# Patient Record
Sex: Male | Born: 1960
Health system: Southern US, Community
[De-identification: ages and names within clinical notes are randomized; demographics above are authoritative.]

## PROBLEM LIST (undated history)

## (undated) DIAGNOSIS — R739 Hyperglycemia, unspecified: Secondary | ICD-10-CM

## (undated) DIAGNOSIS — I10 Essential (primary) hypertension: Secondary | ICD-10-CM

## (undated) DIAGNOSIS — M1990 Unspecified osteoarthritis, unspecified site: Secondary | ICD-10-CM

## (undated) DIAGNOSIS — C801 Malignant (primary) neoplasm, unspecified: Secondary | ICD-10-CM

## (undated) DIAGNOSIS — Z87442 Personal history of urinary calculi: Secondary | ICD-10-CM

## (undated) DIAGNOSIS — E785 Hyperlipidemia, unspecified: Secondary | ICD-10-CM

## (undated) DIAGNOSIS — E119 Type 2 diabetes mellitus without complications: Secondary | ICD-10-CM

## (undated) DIAGNOSIS — K635 Polyp of colon: Secondary | ICD-10-CM

## (undated) HISTORY — DX: Hyperglycemia, unspecified: R73.9

## (undated) HISTORY — DX: Hyperlipidemia, unspecified: E78.5

## (undated) HISTORY — DX: Essential (primary) hypertension: I10

## (undated) HISTORY — PX: BACK SURGERY: SHX140

## (undated) HISTORY — DX: Polyp of colon: K63.5

---

## 2001-01-05 ENCOUNTER — Emergency Department (HOSPITAL_COMMUNITY): Admission: EM | Admit: 2001-01-05 | Discharge: 2001-01-05 | Payer: Self-pay | Admitting: Emergency Medicine

## 2005-02-03 ENCOUNTER — Emergency Department (HOSPITAL_COMMUNITY): Admission: EM | Admit: 2005-02-03 | Discharge: 2005-02-03 | Payer: Self-pay | Admitting: Emergency Medicine

## 2008-04-20 ENCOUNTER — Encounter: Admission: RE | Admit: 2008-04-20 | Discharge: 2008-04-20 | Payer: Self-pay | Admitting: Family Medicine

## 2009-03-17 ENCOUNTER — Emergency Department (HOSPITAL_COMMUNITY): Admission: EM | Admit: 2009-03-17 | Discharge: 2009-03-17 | Payer: Self-pay | Admitting: Family Medicine

## 2009-06-24 LAB — HM COLONOSCOPY

## 2010-09-12 ENCOUNTER — Encounter: Payer: Self-pay | Admitting: Family Medicine

## 2012-10-10 ENCOUNTER — Encounter: Payer: Self-pay | Admitting: Family Medicine

## 2012-10-10 DIAGNOSIS — Z86010 Personal history of colonic polyps: Secondary | ICD-10-CM

## 2012-10-10 DIAGNOSIS — I10 Essential (primary) hypertension: Secondary | ICD-10-CM | POA: Insufficient documentation

## 2012-10-10 DIAGNOSIS — E785 Hyperlipidemia, unspecified: Secondary | ICD-10-CM | POA: Insufficient documentation

## 2012-11-25 ENCOUNTER — Other Ambulatory Visit: Payer: Self-pay | Admitting: Physician Assistant

## 2012-11-25 NOTE — Telephone Encounter (Signed)
Medication refilled per protocol.Patient needs to be seen before any further refills 

## 2012-11-27 ENCOUNTER — Encounter: Payer: Self-pay | Admitting: Physician Assistant

## 2012-11-27 ENCOUNTER — Other Ambulatory Visit: Payer: Self-pay | Admitting: Family Medicine

## 2012-11-27 ENCOUNTER — Ambulatory Visit (INDEPENDENT_AMBULATORY_CARE_PROVIDER_SITE_OTHER): Payer: Managed Care, Other (non HMO) | Admitting: Physician Assistant

## 2012-11-27 VITALS — BP 118/80 | HR 68 | Temp 98.5°F | Resp 18 | Ht 69.0 in | Wt 245.0 lb

## 2012-11-27 DIAGNOSIS — R7309 Other abnormal glucose: Secondary | ICD-10-CM

## 2012-11-27 DIAGNOSIS — R739 Hyperglycemia, unspecified: Secondary | ICD-10-CM

## 2012-11-27 DIAGNOSIS — E785 Hyperlipidemia, unspecified: Secondary | ICD-10-CM

## 2012-11-27 DIAGNOSIS — Z860100 Personal history of colon polyps, unspecified: Secondary | ICD-10-CM

## 2012-11-27 DIAGNOSIS — I10 Essential (primary) hypertension: Secondary | ICD-10-CM

## 2012-11-27 LAB — LIPID PANEL
Cholesterol: 197 mg/dL (ref 0–200)
HDL: 39 mg/dL — ABNORMAL LOW (ref >39)
LDL Cholesterol: 119 mg/dL — ABNORMAL HIGH (ref 0–99)
Total CHOL/HDL Ratio: 5.1 Ratio
Triglycerides: 194 mg/dL — ABNORMAL HIGH (ref <150)
VLDL: 39 mg/dL (ref 0–40)

## 2012-11-27 LAB — COMPLETE METABOLIC PANEL WITH GFR
ALT: 83 U/L — ABNORMAL HIGH (ref 0–53)
AST: 35 U/L (ref 0–37)
Albumin: 4.6 g/dL (ref 3.5–5.2)
Alkaline Phosphatase: 68 U/L (ref 39–117)
BUN: 12 mg/dL (ref 6–23)
CO2: 26 mEq/L (ref 19–32)
Calcium: 9.5 mg/dL (ref 8.4–10.5)
Chloride: 105 mEq/L (ref 96–112)
Creat: 0.76 mg/dL (ref 0.50–1.35)
GFR, Est African American: >89 mL/min
GFR, Est Non African American: >89 mL/min
Glucose, Bld: 105 mg/dL — ABNORMAL HIGH (ref 70–99)
Potassium: 4.4 mEq/L (ref 3.5–5.3)
Sodium: 138 mEq/L (ref 135–145)
Total Bilirubin: 0.6 mg/dL (ref 0.3–1.2)
Total Protein: 6.8 g/dL (ref 6.0–8.3)

## 2012-11-27 LAB — HEMOGLOBIN A1C
Hgb A1c MFr Bld: 6.4 % — ABNORMAL HIGH (ref <5.7)
Mean Plasma Glucose: 137 mg/dL — ABNORMAL HIGH (ref <117)

## 2012-11-27 MED ORDER — SIMVASTATIN 20 MG PO TABS: 20.0000 mg | ORAL_TABLET | Freq: Every day | ORAL | Status: DC

## 2012-11-27 MED ORDER — BENAZEPRIL HCL 20 MG PO TABS: 20.0000 mg | ORAL_TABLET | Freq: Every day | ORAL | Status: DC

## 2012-11-27 MED ORDER — HYDROCHLOROTHIAZIDE 12.5 MG PO CAPS: 12.5000 mg | ORAL_CAPSULE | Freq: Every day | ORAL | Status: DC

## 2012-11-27 NOTE — Progress Notes (Signed)
Patient ID: Ian Leblanc MRN: [id number], DOB: February 01, 1961, 52 y.o. Date of Encounter: @DATE @  Chief Complaint:  Chief Complaint  Patient presents with  . routine check up  med rf  labs    HPI: 52 y.o. year old male  presents for routine f/u.   He has a 46 month old grandson who lives nearby-sees regularly. Has a 73 month old grandchild who lives in [place] come once a year for 18 days-coming in August.  Just started back to walking 25 minutes every day-started back 2-3 weeks ago.  Taking both BP meds as directed. No adverse effects. Taking cholesterol med as directed. No adv effects.  Walking as above.  Says night time is his hardest time regarding his diet.   Past Medical History  Diagnosis Date  . Hypertension   . Hyperlipidemia   . Sessile colonic polyp   . Hyperglycemia      Home Meds: Current Outpatient Prescriptions on File Prior to Visit  Medication Sig Dispense Refill  . benazepril (LOTENSIN) 20 MG tablet TAKE ONE TABLET BY MOUTH EVERY DAY  30 tablet  0  . hydrochlorothiazide (MICROZIDE) 12.5 MG capsule TAKE ONE CAPSULE BY MOUTH EVERY DAY  30 capsule  0  . simvastatin (ZOCOR) 20 MG tablet TAKE ONE TABLET BY MOUTH AT BEDTIME  30 tablet  0   No current facility-administered medications on file prior to visit.    Allergies:  Allergies  Allergen Reactions  . Crestor (Rosuvastatin) Other (See Comments)    Severe myalgias    History   Social History  . Marital Status: Married    Spouse Name: N/A    Number of Children: N/A  . Years of Education: N/A   Occupational History  . Not on file.   Social History Main Topics  . Smoking status: Former Smoker    Quit date: 09/22/2007  . Smokeless tobacco: Not on file  . Alcohol Use: No  . Drug Use: No  . Sexually Active: Not on file   Other Topics Concern  . Not on file   Social History Narrative  . No narrative on file    History reviewed. No pertinent family history.   Review of  Systems: Constitutional: negative for chills, fever, night sweats, weight changes, or fatigue  HEENT: negative for vision changes, hearing loss, congestion, rhinorrhea, ST, epistaxis, or sinus pressure Cardiovascular: negative for chest pain or palpitations. No new/increased shortness of breath or dyspnea on exertion Respiratory: negative for hemoptysis, wheezing, shortness of breath, or cough Abdominal: negative for abdominal pain, nausea, vomiting, diarrhea, or constipation Dermatological: negative for rash or concerning skin lesions Neurologic: negative for headache, dizziness, or syncope All other systems reviewed and are otherwise negative with the exception to those above and in the HPI.   Physical Exam: Blood pressure 118/80, pulse 68, temperature 98.5 F (36.9 C), temperature source Oral, resp. rate 18, height 5\' 9"  (1.753 m), weight 245 lb (111.131 kg)., Body mass index is 36.16 kg/(m^2). General: Well developed, well nourished,WM. Very Pleasant.  in no acute distress  Neck: Supple. No thyromegaly. Full ROM. No lymphadenopathy.No carotid bruits. Lungs: Clear bilaterally to auscultation without wheezes, rales, or rhonchi. Breathing is unlabored. Heart: RRR with S1 S2. No murmurs, rubs, or gallops. Abdomen: Soft, non-tender, non-distended with normoactive bowel sounds. No hepatomegaly. No rebound/guarding. No obvious abdominal masses. Musculoskeletal:  Strength and tone normal for age. Extremities/Skin: Warm and dry. No clubbing or cyanosis. No edema. No rashes or suspicious lesions. Neuro: Alert and  oriented X 3. Moves all extremities spontaneously. Gait is normal. CNII-XII grossly in tact. Psych:  Responds to questions appropriately with a normal affect.    ASSESSMENT AND PLAN:  52 y.o. year old male with  1. HLD (hyperlipidemia) On Simvastatin 20mg . Check labs to monitor. - COMPLETE METABOLIC PANEL WITH GFR - Lipid panel  2. HTN (hypertension) At goal on current meds. Cont  current meds. Check BMET. - COMPLETE METABOLIC PANEL WITH GFR  3. Hyperglycemia See HPI reg diet, exercise.  He has gained weight. He is aware of need for increased compliance sith diet and exercise. Recheck glucose/A1c. Prior Weights; 9/10-220 lbs.   8/12-230 lbs   9/13-240 lbs - COMPLETE METABOLIC PANEL WITH GFR - Hemoglobin A1c  4. Personal history of colonic polyps Had colonoscopy 06/24/09 with polypectomy. Report says repeat 3-5 years. (Due 06/24/12-06/24/14). Pt will call Dr Rolla Etienne office reg f/u date.  5. Screening PSA-Done 9/13.Nml. Repeat 9/14.  6. Immunizations: Tdap given 8/12    Influenza: 9/13  Signed, [address] [place], [place], [facility] 11/27/2012 9:53 AM

## 2012-11-29 ENCOUNTER — Telehealth: Payer: Self-pay | Admitting: Family Medicine

## 2012-11-29 NOTE — Telephone Encounter (Signed)
Message copied by Donne Anon on Fri Nov 29, 2012 11:08 AM ------      Message from: Allayne Butcher      Created: Thu Nov 28, 2012  7:01 PM       Tell him his blood sugar has gotten worse. Is borderline of Diabetes. He must walk regularly (30 minutes every day) and MUST decrease carbohydrates in diet. Schedule f/u OV in 3 months instead of 6 so we can recheck [facility] etc. Continue current medicines without changes now--just make the diet and exercise changes for now and f/u in 3 months. ------

## 2012-11-29 NOTE — Telephone Encounter (Signed)
LMTCB

## 2013-03-05 ENCOUNTER — Encounter: Payer: Self-pay | Admitting: Physician Assistant

## 2013-03-05 ENCOUNTER — Ambulatory Visit (INDEPENDENT_AMBULATORY_CARE_PROVIDER_SITE_OTHER): Payer: 59 | Admitting: Physician Assistant

## 2013-03-05 VITALS — BP 122/80 | HR 60 | Temp 98.0°F | Resp 18 | Ht 69.5 in | Wt 227.0 lb

## 2013-03-05 DIAGNOSIS — Z860100 Personal history of colon polyps, unspecified: Secondary | ICD-10-CM

## 2013-03-05 DIAGNOSIS — R739 Hyperglycemia, unspecified: Secondary | ICD-10-CM

## 2013-03-05 DIAGNOSIS — R7309 Other abnormal glucose: Secondary | ICD-10-CM

## 2013-03-05 DIAGNOSIS — E785 Hyperlipidemia, unspecified: Secondary | ICD-10-CM

## 2013-03-05 DIAGNOSIS — I10 Essential (primary) hypertension: Secondary | ICD-10-CM

## 2013-03-05 LAB — HEMOGLOBIN A1C, FINGERSTICK: Hgb A1C (fingerstick): 5.2 % (ref <5.7)

## 2013-03-05 NOTE — Progress Notes (Signed)
Patient ID: Ian Leblanc MRN: [id number], DOB: 12/28/1960, 52 y.o. Date of Encounter: @DATE @  Chief Complaint:  Chief Complaint  Patient presents with  . 3 mth check up    is fasting    HPI: 52 y.o. year old white male  presents for routine f/u.  He has loast 13 pounds since LOV-4/14. He is still walking every morning for 25 minutes. 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 Also, he has made diet changes since LOV.  Breakfast: [employer]: Eats with his mom: vegetables. Cookedd healthy. He does her grocery shopping. Dinner; His wife follows the carb handout for making their dinner. He had been eating a lot at dinner and eating late.He has started eating earlier and has decreased portions.  Has decreased bread intake a lot.   Taking all meds as directed. No adv effects.   He has a grandson who lives nearby-about 86 months old now. He sees him regularly.  He has a 2 y/o grandchild who lives in [place].Comes every August. They are coming aginthis August. Staying with them in their house for 23 days.    Past Medical History  Diagnosis Date  . Hypertension   . Hyperlipidemia   . Sessile colonic polyp   . Hyperglycemia      Home Meds: See attached medication section for current medication list. Any medications entered into computer today will not appear on this note's list. The medications listed below were entered prior to today. Current Outpatient Prescriptions on File Prior to Visit  Medication Sig Dispense Refill  . aspirin 81 MG tablet Take 81 mg by mouth daily.      . benazepril (LOTENSIN) 20 MG tablet Take 1 tablet (20 mg total) by mouth daily.  90 tablet  1  . hydrochlorothiazide (MICROZIDE) 12.5 MG capsule Take 1 capsule (12.5 mg total) by mouth daily.  90 capsule  1  . Multiple Vitamins-Minerals (MULTIVITAMIN PO) Take 1 tablet by mouth daily. Over 50 mvi qd      . simvastatin (ZOCOR) 20 MG tablet Take 1 tablet (20 mg total) by mouth at bedtime.   90 tablet  1   No current facility-administered medications on file prior to visit.    Allergies:  Allergies  Allergen Reactions  . Crestor (Rosuvastatin) Other (See Comments)    Severe myalgias    History   Social History  . Marital Status: Married    Spouse Name: N/A    Number of Children: N/A  . Years of Education: N/A   Occupational History  . Not on file.   Social History Main Topics  . Smoking status: Former Smoker    Quit date: 09/22/2007  . Smokeless tobacco: Not on file  . Alcohol Use: No  . Drug Use: No  . Sexually Active: Not on file   Other Topics Concern  . Not on file   Social History Narrative  . No narrative on file    History reviewed. No pertinent family history.   Review of Systems:  See HPI for pertinent ROS. All other ROS negative.    Physical Exam: Blood pressure 122/80, pulse 60, temperature 98 F (36.7 C), temperature source Oral, resp. rate 18, height 5' 9.5" (1.765 m), weight 227 lb (102.967 kg)., Body mass index is 33.05 kg/(m^2). General: WNWD WM. Appears in no acute distress. Neck: Supple. No thyromegaly. No lymphadenopathy. No carotid bruits.  Lungs: Clear bilaterally to auscultation without wheezes, rales, or rhonchi. Breathing is unlabored. Heart:  RRR with S1 S2. No murmurs, rubs, or gallops. Abdomen: Soft, non-tender, non-distended with normoactive bowel sounds. No hepatomegaly. No rebound/guarding. No obvious abdominal masses. Musculoskeletal:  Strength and tone normal for age. Extremities/Skin: Warm and dry. No clubbing or cyanosis. No edema. No rashes or suspicious lesions. Neuro: Alert and oriented X 3. Moves all extremities spontaneously. Gait is normal. CNII-XII grossly in tact. Psych:  Responds to questions appropriately with a normal affect.     ASSESSMENT AND PLAN:  52 y.o. year old male with  1. HLD (hyperlipidemia) Cont current med. Had FLP LFT 4/14.  2. HTN (hypertension) At goal. Had Bmet 4/14. Cont  same.  3. Hyperglycemia A1C had increased 4/14. Recheck now. Pt has been very compliant with diet and exercise. Keep up the good work!!! - Hemoglobin A1c  4. Personal history of colonic polyps Had colonoscopy 06/24/09 with polypectomy. Repeat 3-5 years. Pt did f/u with Dr. Elnoria Howard since LOV here. He had scheduled colonoscopy but had to reschedule sec to wife's work schedule.   5.Screening PSA: 04/2012-nml. Repeat 9/14.  6.Immunizations:  TDap: 03/2011  ROV 3 months.  Murray Hodgkins [place], [place], [facility] 03/05/2013 8:18 AM

## 2013-03-05 NOTE — Addendum Note (Signed)
Addended by: WRAY, [place] on: 03/05/2013 09:52 AM   Modules accepted: Orders

## 2013-03-05 NOTE — Addendum Note (Signed)
Addended by: WRAY, [place] on: 03/05/2013 09:51 AM   Modules accepted: Orders

## 2013-03-07 ENCOUNTER — Encounter: Payer: Self-pay | Admitting: Family Medicine

## 2013-04-01 ENCOUNTER — Telehealth: Payer: Self-pay | Admitting: Physician Assistant

## 2013-04-01 MED ORDER — AZITHROMYCIN 250 MG PO TABS: ORAL_TABLET | ORAL | Status: DC

## 2013-04-01 NOTE — Telephone Encounter (Signed)
Patient aware and med sent to pharm 

## 2013-04-01 NOTE — Telephone Encounter (Signed)
z pack is okay but I would caution the patient not to take the abx unless his symptoms worsen or are not better in 1 week.  A cold is a virus and a z-pack will not treat a virus only bacteria.

## 2013-06-05 ENCOUNTER — Encounter: Payer: Self-pay | Admitting: Physician Assistant

## 2013-06-05 ENCOUNTER — Ambulatory Visit (INDEPENDENT_AMBULATORY_CARE_PROVIDER_SITE_OTHER): Payer: Managed Care, Other (non HMO) | Admitting: Physician Assistant

## 2013-06-05 VITALS — BP 154/104 | HR 68 | Temp 98.4°F | Resp 20 | Wt 229.0 lb

## 2013-06-05 DIAGNOSIS — Z860100 Personal history of colon polyps, unspecified: Secondary | ICD-10-CM

## 2013-06-05 DIAGNOSIS — Z125 Encounter for screening for malignant neoplasm of prostate: Secondary | ICD-10-CM

## 2013-06-05 DIAGNOSIS — R739 Hyperglycemia, unspecified: Secondary | ICD-10-CM

## 2013-06-05 DIAGNOSIS — I10 Essential (primary) hypertension: Secondary | ICD-10-CM

## 2013-06-05 DIAGNOSIS — R7309 Other abnormal glucose: Secondary | ICD-10-CM

## 2013-06-05 DIAGNOSIS — E785 Hyperlipidemia, unspecified: Secondary | ICD-10-CM

## 2013-06-05 LAB — LIPID PANEL
Cholesterol: 222 mg/dL — ABNORMAL HIGH (ref 0–200)
HDL: 41 mg/dL (ref >39)
LDL Cholesterol: 136 mg/dL — ABNORMAL HIGH (ref 0–99)
Total CHOL/HDL Ratio: 5.4 Ratio
Triglycerides: 227 mg/dL — ABNORMAL HIGH (ref <150)
VLDL: 45 mg/dL — ABNORMAL HIGH (ref 0–40)

## 2013-06-05 LAB — COMPLETE METABOLIC PANEL WITH GFR
ALT: 50 U/L (ref 0–53)
AST: 23 U/L (ref 0–37)
Albumin: 4.6 g/dL (ref 3.5–5.2)
Alkaline Phosphatase: 60 U/L (ref 39–117)
BUN: 16 mg/dL (ref 6–23)
CO2: 26 mEq/L (ref 19–32)
Calcium: 9.9 mg/dL (ref 8.4–10.5)
Chloride: 103 mEq/L (ref 96–112)
Creat: 0.84 mg/dL (ref 0.50–1.35)
GFR, Est African American: >89 mL/min
GFR, Est Non African American: >89 mL/min
Glucose, Bld: 118 mg/dL — ABNORMAL HIGH (ref 70–99)
Potassium: 4.6 mEq/L (ref 3.5–5.3)
Sodium: 139 mEq/L (ref 135–145)
Total Bilirubin: 0.7 mg/dL (ref 0.3–1.2)
Total Protein: 7 g/dL (ref 6.0–8.3)

## 2013-06-05 LAB — PSA: PSA: 1.13 ng/mL (ref <=4.00)

## 2013-06-05 LAB — HEMOGLOBIN A1C
Hgb A1c MFr Bld: 6.1 % — ABNORMAL HIGH (ref <5.7)
Mean Plasma Glucose: 128 mg/dL — ABNORMAL HIGH (ref <117)

## 2013-06-05 NOTE — Progress Notes (Signed)
Patient ID: Ian Leblanc MRN: [id number], DOB: 09/06/1960, 52 y.o. Date of Encounter: @DATE @  Chief Complaint:  Chief Complaint  Patient presents with  . 3 mth check up    is fasting    HPI: 52 y.o. year old white male  presents for routine followup office visit.  At his last office visit with me on 03/05/13 he had lost 13 pounds since his prior visit in April. He reported that he was walking every morning for 25 minutes. He was also doing a lot of work on his house: Pudding and heart border with floors et [employer]. Also he made a lot of diet changes. Her breakfast he was eating his own bar. For lunch he eats with his mother and was eating vegetables which were cut in a healthy manner. For dinner his wife is following a carbohydrate handout in regards to making her better. Prior to that he had been eating a lot at dinnertime and eating late. He has started eating earlier and decreasing his portions. As well he decrease bread intake a lot. Today his weight is only up 2 pounds compared to her was in July he is maintain this pretty well.  He states that "he knows his bloood pressure is high" as his mother has been stressing him out. Says that she recently hurt both of her shoulders because she was standing in a chair trying to adjust a ceiling fan--fell. Says she also had recently had another incident that involved both his mother and his sister and they both lost her balance and fell and broke a window which he says he now has to repair.  He has a grandson who lives nearby. He is now about 69 months old. He sees him regularly. He also has a 67-year-old grandchild who lives in [place]. They come every August. They did come again this past August and he said he had a very busy, but very good time together.    Past Medical History  Diagnosis Date  . Hypertension   . Hyperlipidemia   . Sessile colonic polyp   . Hyperglycemia      Home Meds: See attached medication section for current medication  list. Any medications entered into computer today will not appear on this note's list. The medications listed below were entered prior to today. Current Outpatient Prescriptions on File Prior to Visit  Medication Sig Dispense Refill  . aspirin 81 MG tablet Take 81 mg by mouth daily.      . benazepril (LOTENSIN) 20 MG tablet Take 1 tablet (20 mg total) by mouth daily.  90 tablet  1  . hydrochlorothiazide (MICROZIDE) 12.5 MG capsule Take 1 capsule (12.5 mg total) by mouth daily.  90 capsule  1  . Multiple Vitamins-Minerals (MULTIVITAMIN PO) Take 1 tablet by mouth daily. Over 50 mvi qd      . simvastatin (ZOCOR) 20 MG tablet Take 1 tablet (20 mg total) by mouth at bedtime.  90 tablet  1   No current facility-administered medications on file prior to visit.    Allergies:  Allergies  Allergen Reactions  . Crestor [Rosuvastatin] Other (See Comments)    Severe myalgias    History   Social History  . Marital Status: Married    Spouse Name: N/A    Number of Children: N/A  . Years of Education: N/A   Occupational History  . Not on file.   Social History Main Topics  . Smoking status: Former Engineer, civil (consulting)  date: 09/22/2007  . Smokeless tobacco: Not on file  . Alcohol Use: No  . Drug Use: No  . Sexual Activity: Not on file   Other Topics Concern  . Not on file   Social History Narrative  . No narrative on file    History reviewed. No pertinent family history.   Review of Systems:  See HPI for pertinent ROS. All other ROS negative.    Physical Exam: Blood pressure 154/104, pulse 68, temperature 98.4 F (36.9 C), temperature source Oral, resp. rate 20, weight 229 lb (103.874 kg)., Body mass index is 33.34 kg/(m^2). General: WNWD WM. Appears in no acute distress. Neck: Supple. No thyromegaly. No lymphadenopathy. No Carotid bruits Lungs: Clear bilaterally to auscultation without wheezes, rales, or rhonchi. Breathing is unlabored. Heart: RRR with S1 S2. No murmurs, rubs, or  gallops. Abdomen: Soft, non-tender, non-distended with normoactive bowel sounds. No hepatomegaly. No rebound/guarding. No obvious abdominal masses. Musculoskeletal:  Strength and tone normal for age. Extremities/Skin: Warm and dry. No clubbing or cyanosis. No edema. No rashes or suspicious lesions. Neuro: Alert and oriented X 3. Moves all extremities spontaneously. Gait is normal. CNII-XII grossly in tact. Psych:  Responds to questions appropriately with a normal affect.     ASSESSMENT AND PLAN:  52 y.o. year old male with  1. HTN (hypertension) His blood pressure is high today. He states that he honestly thinks it is because of his mother. He says that he is a hardy top to his mom as well as his siblings about the fact that he is not continued to be involved and there crazy behavior. His blood pressure was well-controlled at his last visit here in July. Not make changes at this time but will monitor. He knew current medications at this time and check labs monitor - COMPLETE METABOLIC PANEL WITH GFR  2. HLD (hyperlipidemia) - COMPLETE METABOLIC PANEL WITH GFR - Lipid panel  3. Hyperglycemia A1c had increased April 2014. However he was an extremely compliant with diet and exercise and his A1c was down to normal range in July. - COMPLETE METABOLIC PANEL WITH GFR - Hemoglobin A1c  4. Screening for prostate cancer Last PSA was 04/2012. - PSA  5. Personal history of colonic polyps Colonoscopy 06/24/09 with polypectomy. Repeat 3-5 years. She did have repeat colonoscopy since his last visit with me in April. Patient states that this showed a very small polyp. Was told to repeat 5 years.  6. Immunizations: T. dap 03/2011. Recommended influenza vaccine he defers.  Office visit 6 months sooner if needed.   [address] [place], [place], [facility] 06/05/2013 1:44 PM

## 2013-06-06 ENCOUNTER — Telehealth: Payer: Self-pay | Admitting: Family Medicine

## 2013-06-06 DIAGNOSIS — E785 Hyperlipidemia, unspecified: Secondary | ICD-10-CM

## 2013-06-06 MED ORDER — SIMVASTATIN 40 MG PO TABS: 40.0000 mg | ORAL_TABLET | Freq: Every day | ORAL | Status: DC

## 2013-06-06 NOTE — Telephone Encounter (Signed)
Message copied by Donne Anon on Fri Jun 06, 2013  6:24 PM ------      Message from: Allayne Butcher      Created: Fri Jun 06, 2013  9:34 AM       Cholesterol is a little higher than goal.      Currently on Zocor 20mg . Increase to 40mg .       Recheck fasting lab in 6 weeks.      Order Zocor 40 and FLP/LFT.      Rest of labs are good. ------

## 2013-06-06 NOTE — Telephone Encounter (Signed)
Pt aware of labs,  New Rx sent and labs ordered

## 2013-07-04 ENCOUNTER — Other Ambulatory Visit: Payer: Self-pay | Admitting: Physician Assistant

## 2013-07-14 ENCOUNTER — Emergency Department (HOSPITAL_COMMUNITY)
Admission: EM | Admit: 2013-07-14 | Discharge: 2013-07-14 | Discharge status: Against Medical Advice | Payer: Managed Care, Other (non HMO) | Attending: Emergency Medicine | Admitting: Emergency Medicine

## 2013-07-14 ENCOUNTER — Encounter (HOSPITAL_COMMUNITY): Payer: Self-pay | Admitting: Emergency Medicine

## 2013-07-14 ENCOUNTER — Emergency Department (HOSPITAL_COMMUNITY): Payer: Managed Care, Other (non HMO)

## 2013-07-14 ENCOUNTER — Emergency Department (HOSPITAL_COMMUNITY)
Admission: EM | Admit: 2013-07-14 | Discharge: 2013-07-14 | Disposition: A | Discharge status: Home / Self Care | Payer: Managed Care, Other (non HMO) | Attending: Emergency Medicine | Admitting: Emergency Medicine

## 2013-07-14 DIAGNOSIS — Z87891 Personal history of nicotine dependence: Secondary | ICD-10-CM | POA: Insufficient documentation

## 2013-07-14 DIAGNOSIS — Z79899 Other long term (current) drug therapy: Secondary | ICD-10-CM | POA: Insufficient documentation

## 2013-07-14 DIAGNOSIS — R209 Unspecified disturbances of skin sensation: Secondary | ICD-10-CM | POA: Insufficient documentation

## 2013-07-14 DIAGNOSIS — I10 Essential (primary) hypertension: Secondary | ICD-10-CM | POA: Insufficient documentation

## 2013-07-14 DIAGNOSIS — R0602 Shortness of breath: Secondary | ICD-10-CM | POA: Insufficient documentation

## 2013-07-14 DIAGNOSIS — R0789 Other chest pain: Secondary | ICD-10-CM | POA: Insufficient documentation

## 2013-07-14 DIAGNOSIS — Z860100 Personal history of colon polyps, unspecified: Secondary | ICD-10-CM | POA: Insufficient documentation

## 2013-07-14 DIAGNOSIS — R079 Chest pain, unspecified: Secondary | ICD-10-CM

## 2013-07-14 DIAGNOSIS — Z7982 Long term (current) use of aspirin: Secondary | ICD-10-CM | POA: Insufficient documentation

## 2013-07-14 DIAGNOSIS — E785 Hyperlipidemia, unspecified: Secondary | ICD-10-CM | POA: Insufficient documentation

## 2013-07-14 LAB — BASIC METABOLIC PANEL
BUN: 15 mg/dL (ref 6–23)
CO2: 22 mEq/L (ref 19–32)
Calcium: 8.9 mg/dL (ref 8.4–10.5)
Chloride: 102 mEq/L (ref 96–112)
Creatinine, Ser: 0.62 mg/dL (ref 0.50–1.35)
GFR calc Af Amer: >90 mL/min (ref >90)
GFR calc non Af Amer: >90 mL/min (ref >90)
Glucose, Bld: 114 mg/dL — ABNORMAL HIGH (ref 70–99)
Potassium: 3.6 mEq/L (ref 3.5–5.1)
Sodium: 135 mEq/L (ref 135–145)

## 2013-07-14 LAB — CBC
HCT: 43.5 % (ref 39.0–52.0)
Hemoglobin: 15.6 g/dL (ref 13.0–17.0)
MCH: 30.6 pg (ref 26.0–34.0)
MCHC: 35.9 g/dL (ref 30.0–36.0)
MCV: 85.3 fL (ref 78.0–100.0)
Platelets: 152 10*3/uL (ref 150–400)
RBC: 5.1 MIL/uL (ref 4.22–5.81)
RDW: 13 % (ref 11.5–15.5)
WBC: 7.3 10*3/uL (ref 4.0–10.5)

## 2013-07-14 LAB — COMPREHENSIVE METABOLIC PANEL
ALT: 56 U/L — ABNORMAL HIGH (ref 0–53)
AST: 29 U/L (ref 0–37)
Albumin: 4.4 g/dL (ref 3.5–5.2)
Alkaline Phosphatase: 70 U/L (ref 39–117)
BUN: 12 mg/dL (ref 6–23)
CO2: 22 mEq/L (ref 19–32)
Calcium: 9.4 mg/dL (ref 8.4–10.5)
Chloride: 102 mEq/L (ref 96–112)
Creatinine, Ser: 0.65 mg/dL (ref 0.50–1.35)
GFR calc Af Amer: >90 mL/min (ref >90)
GFR calc non Af Amer: >90 mL/min (ref >90)
Glucose, Bld: 103 mg/dL — ABNORMAL HIGH (ref 70–99)
Potassium: 3.7 mEq/L (ref 3.5–5.1)
Sodium: 137 mEq/L (ref 135–145)
Total Bilirubin: 0.3 mg/dL (ref 0.3–1.2)
Total Protein: 7.5 g/dL (ref 6.0–8.3)

## 2013-07-14 LAB — POCT I-STAT TROPONIN I
Troponin i, poc: 0 ng/mL (ref 0.00–0.08)
Troponin i, poc: 0 ng/mL (ref 0.00–0.08)

## 2013-07-14 MED ORDER — ASPIRIN 325 MG PO TABS
325.0000 mg | ORAL_TABLET | Freq: Once | ORAL | Status: AC
  Administered 2013-07-14: 325 mg via ORAL
  Filled 2013-07-14: qty 1

## 2013-07-14 NOTE — ED Notes (Signed)
Patient with shortness of breath, increasing since 2pm this afternoon.  Patient states he is unable to take a deep breath.  Patient denies any chest pain, but has had bilateral arm numbness that has gone away.

## 2013-07-14 NOTE — ED Notes (Signed)
W/r updated about wait.  

## 2013-07-14 NOTE — ED Notes (Signed)
Pt reports sob since last night, sts labored breathing, pt reports that here last night and left at 230 due to wait times and reports heaviness wile breathing today. No pain at present.

## 2013-07-14 NOTE — ED Notes (Signed)
W/r updated about wait.

## 2013-07-14 NOTE — ED Notes (Signed)
No answer, unable to find. 

## 2013-07-14 NOTE — ED Notes (Signed)
Wait, plan process explained with rationale. Xray resulted explained. Labs drawn explained.pt talking about leaving. Encouraged to stay.

## 2013-07-14 NOTE — ED Provider Notes (Signed)
CSN: [id number]     Arrival date & time 07/14/13  1332 History   First MD Initiated Contact with Patient 07/14/13 1459     Chief Complaint  Patient presents with  . Shortness of Breath   (Consider location/radiation/quality/duration/timing/severity/associated sxs/prior Treatment) HPI Ian Leblanc is a 52 y.o. male who presented to the emergency department for concern of SOB.  Patient reports that this started approximately 16 hours ago.  Just felt a heaviness in his chest.  Moderate in severity.  Never had this before.  No cough.  Came in to the ED last night and had negative CXR and undetectable troponin.  Unfortunately, the ED wait was too long and he left before being seen.  Symptoms improved and he has now been asymptomatic for last 12 hours.  No other symptoms.  Past Medical History  Diagnosis Date  . Hypertension   . Hyperlipidemia   . Sessile colonic polyp   . Hyperglycemia    Past Surgical History  Procedure Laterality Date  . Hx back surg     No family history on file. History  Substance Use Topics  . Smoking status: Former Smoker    Quit date: 09/22/2007  . Smokeless tobacco: Not on file  . Alcohol Use: No    Review of Systems  Constitutional: Negative for fever and chills.  HENT: Negative for congestion and sore throat.   Respiratory: Positive for shortness of breath. Negative for cough.   Gastrointestinal: Negative for nausea, vomiting, abdominal pain, diarrhea and constipation.  Endocrine: Negative for polyuria.  Genitourinary: Negative for dysuria and hematuria.  Musculoskeletal: Negative for neck pain.  Skin: Negative for rash.  Neurological: Negative for headaches.  Psychiatric/Behavioral: Negative.   All other systems reviewed and are negative.    Allergies  Crestor  Home Medications   Current Outpatient Rx  Name  Route  Sig  Dispense  Refill  . aspirin 81 MG tablet   Oral   Take 81 mg by mouth daily.         . benazepril (LOTENSIN) 20 MG  tablet   Oral   Take 20 mg by mouth daily.         . hydrochlorothiazide (MICROZIDE) 12.5 MG capsule   Oral   Take 12.5 mg by mouth daily.         . Multiple Vitamins-Minerals (MULTIVITAMIN PO)   Oral   Take 1 tablet by mouth daily. Over 50 mvi qd         . Omega-3 Fatty Acids (OMEGA-3 FISH OIL PO)   Oral   Take 3 capsules by mouth daily.         . simvastatin (ZOCOR) 40 MG tablet   Oral   Take 1 tablet (40 mg total) by mouth at bedtime.   90 tablet   0    BP 137/93  Pulse 70  Temp(Src) 98.2 F (36.8 C)  Resp 17  SpO2 93% Physical Exam  Nursing note and vitals reviewed. Constitutional: He is oriented to person, place, and time. He appears well-developed and well-nourished. No distress.  HENT:  Head: Normocephalic and atraumatic.  Right Ear: External ear normal.  Left Ear: External ear normal.  Mouth/Throat: Oropharynx is clear and moist. No oropharyngeal exudate.  Eyes: Conjunctivae are normal. Pupils are equal, round, and reactive to light. Right eye exhibits no discharge.  Neck: Normal range of motion. Neck supple. No tracheal deviation present.  Cardiovascular: Normal rate, regular rhythm and intact distal pulses.   Pulmonary/Chest: Effort  normal. No respiratory distress. He has no wheezes. He has no rales.  Abdominal: Soft. He exhibits no distension. There is no tenderness. There is no rebound and no guarding.  Musculoskeletal: Normal range of motion.  Neurological: He is alert and oriented to person, place, and time.  Skin: Skin is warm and dry. No rash noted. He is not diaphoretic.  Psychiatric: He has a normal mood and affect.    ED Course  Procedures (including critical care time) Labs Review Labs Reviewed  COMPREHENSIVE METABOLIC PANEL - Abnormal; Notable for the following:    Glucose, Bld 103 (*)    ALT 56 (*)    All other components within normal limits  POCT I-STAT TROPONIN I   Imaging Review Dg Chest 2 View  07/14/2013   CLINICAL DATA:   Shortness of breath.  EXAM: CHEST  2 VIEW  COMPARISON:  None.  FINDINGS: The heart size and mediastinal contours are within normal limits. Both lungs are clear. The visualized skeletal structures are unremarkable.  IMPRESSION: No active cardiopulmonary disease.   Electronically Signed   By: Myles Rosenthal M.D.   On: 07/14/2013 00:38    EKG Interpretation   None       MDM   1. Chest pain   2. Shortness of breath     Ian Leblanc is a 52 y.o. male with history of HTN, HLD, and diet controlled DM2 who presents to the ED with 2 hours of SOB and chest heaviness that resolved 12 hours ago.  Patient with negative troponin at that time (although LWBS) and negative troponin now making a significant delta.  Very atypical story for PE and patient PERC negative.  Doubt dissection given description and complete resolution of symptoms.  Discussed at length his risk factors.  Patient discussed with cardiologists at [facility] and patient will get outpatient in 5 days.  No sooner appt could be achieved as it is Thanksgiving weekend.    Arloa Koh, MD 07/14/13 1744

## 2013-07-14 NOTE — ED Notes (Addendum)
Sob and upper abd pain since sat got bad last night and  Came here but wait was to long so he left and came back today states discomfort is there and it has never been there before

## 2013-07-14 NOTE — ED Provider Notes (Addendum)
52 year old male was awakened by difficulty breathing and some chest heaviness last night. He came to the emergency where he had blood work, ECG, chest x-ray but was not seen by physician. The wait was too long so he went home. He asked he was completely pain free and breathing normally from about 3 AM. He has not had any discomfort since then, but his wife convinced him to come in to be adequately checked. He does relate that he walks 20 minutes every day nevertheless several weeks, he has noted that he is winded at the end of the walk but no chest pain, heaviness, tightness, pressure. He waited feeling well or get better within a couple of minutes of stopping walking. He has not noticed any difficulty climbing steps. He does have cardiac risk factors of hyperlipidemia and hypertension. On exam, he is normotensive. Lungs are clear and heart has regular rate and rhythm. There is trace pitting edema. Old records are reviewed and he has had a slightly elevated hemoglobin A1c but no actual significant hyperglycemia documented. There is a concern that this may be an angina equivalent. His troponin here is normal and being approximately 12 hours after resolution of symptoms, is sufficient to rule out acute myocardial injury. Will be referred to [facility] for outpatient stress testing. His RD taking aspirin on a daily basis.  I saw and evaluated the patient, reviewed the resident's note and I agree with the findings and plan.  EKG Interpretation    Date/Time:  Monday July 14 2013 13:49:41 EST Ventricular Rate:  69 PR Interval:  212 QRS Duration: 84 QT Interval:  372 QTC Calculation: 398 R Axis:   48 Text Interpretation:  Sinus rhythm with 1st degree A-V block Anteroseptal infarct , age undetermined Abnormal ECG No significant change since last tracing Confirmed by [facility]  MD, Jaskirat Schwieger [phone]) on 07/14/2013 3:48:27 PM              Dione Booze, MD 07/14/13 1914  Dione Booze, MD 07/14/13  [phone]

## 2013-07-14 NOTE — ED Notes (Signed)
Pt up ambulatory to the bathroom at this time with no difficulty or distress; family at bedside

## 2013-07-16 ENCOUNTER — Encounter: Payer: Self-pay | Admitting: Internal Medicine

## 2013-07-16 ENCOUNTER — Ambulatory Visit (INDEPENDENT_AMBULATORY_CARE_PROVIDER_SITE_OTHER): Payer: Managed Care, Other (non HMO) | Admitting: Internal Medicine

## 2013-07-16 VITALS — BP 130/70 | HR 87 | Ht 70.0 in | Wt 239.2 lb

## 2013-07-16 DIAGNOSIS — R9431 Abnormal electrocardiogram [ECG] [EKG]: Secondary | ICD-10-CM | POA: Insufficient documentation

## 2013-07-16 DIAGNOSIS — I10 Essential (primary) hypertension: Secondary | ICD-10-CM

## 2013-07-16 DIAGNOSIS — R079 Chest pain, unspecified: Secondary | ICD-10-CM

## 2013-07-16 DIAGNOSIS — E785 Hyperlipidemia, unspecified: Secondary | ICD-10-CM

## 2013-07-16 HISTORY — DX: Chest pain, unspecified: R07.9

## 2013-07-16 HISTORY — DX: Abnormal electrocardiogram (ECG) (EKG): R94.31

## 2013-07-16 MED ORDER — NITROGLYCERIN 0.4 MG SL SUBL: 0.4000 mg | SUBLINGUAL_TABLET | SUBLINGUAL | Status: DC | PRN

## 2013-07-16 NOTE — Patient Instructions (Signed)
Dr. Rennis Golden has ordered nitroglycerin sublingual tablets to use as needed for chest pain. You can use 1 tablet every 5 minutes, up to 3 doses. If your pain does not subside after 2 doses, you may want to go the to ED.  Your physician has requested that you have a lexiscan myoview. For further information please visit [email or web address] Please follow instruction sheet, as given. Please schedule this test for early next week.  Your physician recommends that you schedule a follow-up appointment after your test.

## 2013-07-16 NOTE — Progress Notes (Signed)
OFFICE NOTE  Chief Complaint:  Chest pain, DOE, fatigue  Primary Care Physician: Frazier Richards, PA-C  HPI:  Ian Leblanc is a pleasant 52 year old male who was recently awakened in the middle of night with shortness of breath. He felt like he could not catch his breath and there was a knot across his upper stomach area. He denied any specific chest pain but has had some left upper chest pain in the area around his left shoulder. He knows his symptoms have been getting worse over the past several weeks and he feels more fatigued and has poor exercise tolerance. 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  He presented a few days ago (11/24) to the emergency room and had a chest x-ray and troponin drawn which were negative.  Labs were otherwise unremarkable.  Unfortunately due to a significant weight in the emergency room, he left AMA without being seen.  After returning home he had some more symptoms and his wife recommended that he go back to the emergency room. He was seen the following day and another troponin was drawn which was also negative. It was felt that he should have cardiology followup, but he was concerned about waiting over the holiday and we were able to accommodate seeing him today.  PMHx:  Past Medical History  Diagnosis Date  . Hypertension   . Hyperlipidemia   . Sessile colonic polyp   . Hyperglycemia     Past Surgical History  Procedure Laterality Date  . Back surgery      cyst removal    FAMHx:  History reviewed. No pertinent family history.  SOCHx:   reports that he quit smoking about 10 years ago. He has never used smokeless tobacco. He reports that he does not drink alcohol or use illicit drugs.  ALLERGIES:    Allergies  Allergen Reactions  . Crestor [Rosuvastatin] Other (See Comments)    Severe myalgias    ROS: A comprehensive review of systems was negative except for: Constitutional: positive for fatigue Respiratory: positive for dyspnea on exertion Cardiovascular: positive for chest pain  HOME MEDS: Current Outpatient Prescriptions  Medication Sig Dispense Refill  . aspirin 81 MG tablet Take 81 mg by mouth daily.      . benazepril (LOTENSIN) 20 MG tablet Take 20 mg by mouth daily.      . hydrochlorothiazide (MICROZIDE) 12.5 MG capsule Take 12.5 mg by mouth daily.      . Multiple Vitamins-Minerals (MULTIVITAMIN PO) Take 1 tablet by mouth daily. Over 50 mvi qd      . Omega-3 Fatty Acids (OMEGA-3 FISH OIL PO) Take 600 mg by mouth daily.       . simvastatin (ZOCOR) 40 MG tablet Take 1 tablet (40 mg total) by mouth at bedtime.  90 tablet  0  . nitroGLYCERIN (NITROSTAT) 0.4 MG SL tablet Place 1 tablet (0.4 mg total) under the tongue every 5 (five) minutes as needed for chest pain.  25 tablet  3   No current facility-administered medications for this visit.    LABS/IMAGING: No results found for this or any previous visit (from the past 48 hour(s)). No results found.  VITALS: BP 130/70  Pulse 87  Ht 5\' 10"  (1.778 m)  Wt 239 lb 3.2 oz (108.5 kg)  BMI 34.32 kg/m2  EXAM: General appearance: alert and no distress Neck: no carotid bruit and no JVD Lungs: clear to auscultation bilaterally Heart: regular rate and rhythm, S1, S2 normal, no murmur, click, rub or gallop Abdomen: soft, non-tender; bowel sounds normal; no masses,  no organomegaly Extremities: extremities normal, atraumatic, no cyanosis or edema Pulses: 2+ and symmetric Skin: Skin color, texture, turgor normal. No rashes or lesions Neurologic: Grossly normal Psych: Mood, affect normal  EKG: Sinus rhythm at 87, Q waves in V1 through V3 concerning for possible septal MI  ASSESSMENT: 1. Chest pain, fatigue and progressive  dyspnea on exertion 2. Recent negative troponins and a normal chest x-ray 3. Dyslipidemia 4. Hypertension 5. Abnormal EKG  PLAN: 1.   Mr. Schoenfelder has symptoms concerning for ischemia. He's had recent upper abdominal/lower chest pain, but more importantly progressive fatigue, decreased exercise tolerance and shortness of breath. His cholesterol has been suboptimally controlled and recently had increase in his cholesterol medicine. His blood pressure is well-controlled. His EKG is concerning in that there is poor R-wave progression and Q waves in V1 through V3 which could suggest possible septal MI versus abnormal lead placement. He did have similar changes though in the emergency room, which leads me to believe that this may be a real finding.  Based on this constellation of progressive symptoms, I am concerned about ischemia and would recommend a lexiscan nuclear stress test.  We'll not be able to get this test until next week, so I will go ahead and prescribe him for sublingual nitroglycerin. He was instructed to take that if his symptoms  do not improve with rest.  He should continue daily aspirin and his additional medications.  He is instructed to call us or present to the emergency room if his chest pain comes back or shortness of breath does not improve with rest.  Plan to see him back in a few weeks to discuss results of his stress testing.  Chrystie Nose, MD, [facility] Attending Cardiologist [facility]  HILTY,Kenneth C 07/16/2013, 4:04 PM

## 2013-07-21 ENCOUNTER — Encounter: Payer: Self-pay | Admitting: Internal Medicine

## 2013-07-23 ENCOUNTER — Ambulatory Visit (HOSPITAL_COMMUNITY)
Admission: RE | Admit: 2013-07-23 | Discharge: 2013-07-23 | Disposition: A | Discharge status: Home / Self Care | Payer: Managed Care, Other (non HMO) | Source: Ambulatory Visit | Attending: Cardiovascular Disease | Admitting: Cardiovascular Disease

## 2013-07-23 DIAGNOSIS — I10 Essential (primary) hypertension: Secondary | ICD-10-CM | POA: Insufficient documentation

## 2013-07-23 DIAGNOSIS — Z87891 Personal history of nicotine dependence: Secondary | ICD-10-CM | POA: Insufficient documentation

## 2013-07-23 DIAGNOSIS — I252 Old myocardial infarction: Secondary | ICD-10-CM | POA: Insufficient documentation

## 2013-07-23 DIAGNOSIS — R0989 Other specified symptoms and signs involving the circulatory and respiratory systems: Secondary | ICD-10-CM | POA: Insufficient documentation

## 2013-07-23 DIAGNOSIS — E663 Overweight: Secondary | ICD-10-CM | POA: Insufficient documentation

## 2013-07-23 DIAGNOSIS — R5381 Other malaise: Secondary | ICD-10-CM | POA: Insufficient documentation

## 2013-07-23 DIAGNOSIS — R079 Chest pain, unspecified: Secondary | ICD-10-CM

## 2013-07-23 DIAGNOSIS — Z8249 Family history of ischemic heart disease and other diseases of the circulatory system: Secondary | ICD-10-CM | POA: Insufficient documentation

## 2013-07-23 DIAGNOSIS — R0609 Other forms of dyspnea: Secondary | ICD-10-CM | POA: Insufficient documentation

## 2013-07-23 DIAGNOSIS — R42 Dizziness and giddiness: Secondary | ICD-10-CM | POA: Insufficient documentation

## 2013-07-23 DIAGNOSIS — R209 Unspecified disturbances of skin sensation: Secondary | ICD-10-CM | POA: Insufficient documentation

## 2013-07-23 MED ORDER — REGADENOSON 0.4 MG/5ML IV SOLN
0.4000 mg | Freq: Once | INTRAVENOUS | Status: AC
  Administered 2013-07-23: 0.4 mg via INTRAVENOUS

## 2013-07-23 MED ORDER — TECHNETIUM TC 99M SESTAMIBI GENERIC - CARDIOLITE
10.3000 | Freq: Once | INTRAVENOUS | Status: AC | PRN
  Administered 2013-07-23: 10 via INTRAVENOUS

## 2013-07-23 MED ORDER — TECHNETIUM TC 99M SESTAMIBI GENERIC - CARDIOLITE
30.3000 | Freq: Once | INTRAVENOUS | Status: AC | PRN
  Administered 2013-07-23: 30.3 via INTRAVENOUS

## 2013-07-23 NOTE — Procedures (Addendum)
[facility] [address] [place] [address] [phone]  Cardiology Nuclear Med Study  Ian Leblanc is a 52 y.o. male     MRN : [id number]     DOB: [birth year]  Procedure Date: 07/23/2013  Nuclear Med Background Indication for Stress Test:  Evaluation for Ischemia and Abnormal EKG History:  No prior cardiac or respiratory history reported by patient. Cardiac Risk Factors: Family History - CAD, History of Smoking, Hypertension, Lipids and Overweight  Symptoms:  Chest Pain, Dizziness, DOE, Fatigue, Light-Headedness, SOB and Hand numbness.   Nuclear Pre-Procedure Caffeine/Decaff Intake:  7:00pm NPO After: 5:00am   IV Site: R Hand  IV 0.9% NS with Angio Cath:  22g  Chest Size (in):  44"  IV Started by: Emmit Pomfret, RN  Height: 5\' 10"  (1.778 m)  Cup Size: n/a  BMI:  Body mass index is 34.29 kg/(m^2). Weight:  239 lb (108.41 kg)   Tech Comments:  n/a    Nuclear Med Study 1 or 2 day study: 1 day  Stress Test Type:  Lexiscan  Order Authorizing Provider:  Zoila Shutter, MD   Resting Radionuclide: Technetium 59m Sestamibi  Resting Radionuclide Dose: 10.3 mCi   Stress Radionuclide:  Technetium 52m Sestamibi  Stress Radionuclide Dose: 30.3 mCi           Stress Protocol Rest HR: 67 Stress HR: 105  Rest BP: 136/84 Stress BP: 154/63  Exercise Time (min): n/a METS: n/a   Predicted Max HR: 166 bpm % Max HR: 63.86 bpm Rate Pressure Product: 21308  Dose of Adenosine (mg):  n/a Dose of Lexiscan: 0.4 mg  Dose of Atropine (mg): n/a Dose of Dobutamine: n/a mcg/kg/min (at max HR)  Stress Test Technologist: Esperanza Sheets, CCT Nuclear Technologist: Gonzella Lex, CNMT   Rest Procedure:  Myocardial perfusion imaging was performed at rest 45 minutes following the intravenous administration of Technetium 59m Sestamibi. Stress Procedure:  The patient received IV Lexiscan 0.4 mg over 15-seconds.  Technetium 28m Sestamibi injected  at 30-seconds.  There were no significant changes with Lexiscan.  Quantitative spect images were obtained after a 45 minute delay.  Transient Ischemic Dilatation (Normal <1.22):  0.81 Lung/Heart Ratio (Normal <0.45):  0.23 QGS EDV:  70 ml QGS ESV:  25 ml LV Ejection Fraction: 64%     Rest ECG: NSR - Normal EKG and NSR, old anteroseptal MI  Stress ECG: No significant change from baseline ECG  QPS Raw Data Images:  Normal; no motion artifact; normal heart/lung ratio. Stress Images:  Normal homogeneous uptake in all areas of the myocardium. Rest Images:  Normal homogeneous uptake in all areas of the myocardium. Subtraction (SDS):  No evidence of ischemia. LV Wall Motion:  NL LV Function; NL Wall Motion  Impression Exercise Capacity:  Lexiscan with no exercise. BP Response:  Normal blood pressure response. Clinical Symptoms:  No significant symptoms noted. ECG Impression:  No significant ECG changes with Lexiscan. Comparison with Prior Nuclear Study: No previous nuclear study performed   Overall Impression:  Normal stress nuclear study.   Thurmon Fair, MD  07/23/2013 2:03 PM

## 2013-07-25 ENCOUNTER — Telehealth: Payer: Self-pay | Admitting: Internal Medicine

## 2013-07-25 NOTE — Telephone Encounter (Signed)
Calling to find out about his stress test and his nuclear stress test .. Please Call    Thanks

## 2013-07-25 NOTE — Telephone Encounter (Signed)
Normal stress results called to patient

## 2013-07-31 ENCOUNTER — Encounter: Payer: Self-pay | Admitting: Internal Medicine

## 2013-07-31 ENCOUNTER — Ambulatory Visit (INDEPENDENT_AMBULATORY_CARE_PROVIDER_SITE_OTHER): Payer: Managed Care, Other (non HMO) | Admitting: Internal Medicine

## 2013-07-31 VITALS — BP 132/88 | HR 96 | Ht 70.0 in | Wt 241.9 lb

## 2013-07-31 DIAGNOSIS — E785 Hyperlipidemia, unspecified: Secondary | ICD-10-CM

## 2013-07-31 DIAGNOSIS — R079 Chest pain, unspecified: Secondary | ICD-10-CM

## 2013-07-31 DIAGNOSIS — I10 Essential (primary) hypertension: Secondary | ICD-10-CM

## 2013-07-31 DIAGNOSIS — R9431 Abnormal electrocardiogram [ECG] [EKG]: Secondary | ICD-10-CM

## 2013-07-31 NOTE — Patient Instructions (Signed)
Your physician recommends that you schedule a follow-up appointment as needed  

## 2013-07-31 NOTE — Progress Notes (Signed)
OFFICE NOTE  Chief Complaint:  Chest pain, DOE, fatigue  Primary Care Physician: Ian Richards, PA-C  HPI:  Ian Leblanc is a pleasant 52 year old male who was recently awakened in the middle of night with shortness of breath. He felt like he could not catch his breath and there was a knot across his upper stomach area. He denied any specific chest pain but has had some left upper chest pain in the area around his left shoulder. He knows his symptoms have been getting worse over the past several weeks and he feels more fatigued and has poor exercise tolerance. 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  He presented a few days ago (11/24) to the emergency room and had a chest x-ray and troponin drawn which were negative.  Labs were otherwise unremarkable.  Unfortunately due to a significant weight in the emergency room, he left AMA without being seen.  After returning home he had some more symptoms and his wife recommended that he go back to the emergency room. He was seen the following day and another troponin was drawn which was also negative.  S. office visit I recommended a nuclear stress test. He did have a stress test on 07/23/2013 which was negative for ischemia EF was 64%.  A day or 2 after the stress test she reported feeling markedly better and treatment back to himself. He's had no further episodes since that time.  PMHx:  Past Medical History  Diagnosis Date  . Hypertension   . Hyperlipidemia   . Sessile colonic polyp   . Hyperglycemia     Past Surgical History  Procedure Laterality Date  . Back surgery      cyst removal    FAMHx:  History reviewed. No pertinent family history.  SOCHx:   reports that he quit smoking about 10  years ago. He has never used smokeless tobacco. He reports that he does not drink alcohol or use illicit drugs.  ALLERGIES:  Allergies  Allergen Reactions  . Crestor [Rosuvastatin] Other (See Comments)    Severe myalgias    ROS: A comprehensive review of systems was negative.  HOME MEDS: Current Outpatient Prescriptions  Medication Sig Dispense Refill  . aspirin 81 MG tablet Take 81 mg by mouth daily.      . benazepril (LOTENSIN) 20 MG tablet Take 20 mg by mouth daily.      . hydrochlorothiazide (MICROZIDE) 12.5 MG capsule Take 12.5 mg by mouth daily.      . Multiple Vitamins-Minerals (MULTIVITAMIN PO) Take 1 tablet by mouth daily. Over 50 mvi qd      . nitroGLYCERIN (NITROSTAT) 0.4 MG SL tablet Place 1 tablet (0.4 mg total) under the tongue every 5 (five) minutes as needed for chest pain.  25 tablet  3  . Omega-3 Fatty Acids (OMEGA-3 FISH OIL PO) Take 600 mg by mouth daily.       . simvastatin (ZOCOR) 20 MG tablet Take 20 mg by mouth daily.       No current facility-administered medications for this visit.    LABS/IMAGING: No results found for this or any previous visit (from the past 48 hour(s)). No results found.  VITALS: BP 132/88  Pulse 96  Ht 5'  10" (1.778 m)  Wt 241 lb 14.4 oz (109.725 kg)  BMI 34.71 kg/m2  EXAM: deferred  EKG: deferred  ASSESSMENT: 1. Chest pain - negative nuclear stress test, EF 64% 2. Recent negative troponins and a normal chest x-ray 3. Dyslipidemia 4. Hypertension 5. Abnormal EKG  PLAN: 1.   [name] had a negative nuclear stress test and reportedly started feeling better much after that. He seems to be back to himself and has had no further symptoms. His blood pressure is better controlled.  Not really sure what caused this episode, but it may have been an exposure or inhalation when he was raking leaves or possibly even a viral infection. Nevertheless he's feeling better at this time and I would recommend continued work on risk  factor modification, exercise and dietary changes to lose weight.  I am happy to see him back as needed.  Chrystie Nose, MD, [facility] Attending Cardiologist [facility]  Arnetha Silverthorne C 07/31/2013, 4:08 PM

## 2013-10-09 ENCOUNTER — Other Ambulatory Visit: Payer: Self-pay | Admitting: Physician Assistant

## 2013-10-09 ENCOUNTER — Other Ambulatory Visit: Payer: Self-pay | Admitting: Family Medicine

## 2013-10-09 DIAGNOSIS — E785 Hyperlipidemia, unspecified: Secondary | ICD-10-CM

## 2013-10-09 DIAGNOSIS — I10 Essential (primary) hypertension: Secondary | ICD-10-CM

## 2013-10-10 ENCOUNTER — Telehealth: Payer: Self-pay | Admitting: Family Medicine

## 2013-10-10 DIAGNOSIS — I10 Essential (primary) hypertension: Secondary | ICD-10-CM

## 2013-10-10 MED ORDER — HYDROCHLOROTHIAZIDE 12.5 MG PO CAPS: 12.5000 mg | ORAL_CAPSULE | Freq: Every day | ORAL | Status: DC

## 2013-10-10 MED ORDER — BENAZEPRIL HCL 20 MG PO TABS: 20.0000 mg | ORAL_TABLET | Freq: Every day | ORAL | Status: DC

## 2013-10-10 NOTE — Telephone Encounter (Signed)
Medication refilled per protocol. 

## 2013-10-10 NOTE — Telephone Encounter (Signed)
Refill added to other refill request

## 2013-12-04 ENCOUNTER — Ambulatory Visit (INDEPENDENT_AMBULATORY_CARE_PROVIDER_SITE_OTHER): Payer: Managed Care, Other (non HMO) | Admitting: Physician Assistant

## 2013-12-04 ENCOUNTER — Encounter: Payer: Self-pay | Admitting: Physician Assistant

## 2013-12-04 VITALS — BP 130/86 | HR 64 | Temp 97.9°F | Resp 18 | Ht 70.0 in | Wt 243.0 lb

## 2013-12-04 DIAGNOSIS — E785 Hyperlipidemia, unspecified: Secondary | ICD-10-CM

## 2013-12-04 DIAGNOSIS — R739 Hyperglycemia, unspecified: Secondary | ICD-10-CM

## 2013-12-04 DIAGNOSIS — Z86010 Personal history of colonic polyps: Secondary | ICD-10-CM

## 2013-12-04 DIAGNOSIS — I10 Essential (primary) hypertension: Secondary | ICD-10-CM

## 2013-12-04 DIAGNOSIS — R7309 Other abnormal glucose: Secondary | ICD-10-CM

## 2013-12-04 DIAGNOSIS — R079 Chest pain, unspecified: Secondary | ICD-10-CM

## 2013-12-04 LAB — COMPLETE METABOLIC PANEL WITH GFR
ALT: 79 U/L — ABNORMAL HIGH (ref 0–53)
AST: 35 U/L (ref 0–37)
Albumin: 4.6 g/dL (ref 3.5–5.2)
Alkaline Phosphatase: 71 U/L (ref 39–117)
BUN: 15 mg/dL (ref 6–23)
CO2: 25 mEq/L (ref 19–32)
Calcium: 10 mg/dL (ref 8.4–10.5)
Chloride: 103 mEq/L (ref 96–112)
Creat: 0.87 mg/dL (ref 0.50–1.35)
GFR, Est African American: >89 mL/min
GFR, Est Non African American: >89 mL/min
Glucose, Bld: 138 mg/dL — ABNORMAL HIGH (ref 70–99)
Potassium: 4.5 mEq/L (ref 3.5–5.3)
Sodium: 136 mEq/L (ref 135–145)
Total Bilirubin: 0.5 mg/dL (ref 0.2–1.2)
Total Protein: 7 g/dL (ref 6.0–8.3)

## 2013-12-04 LAB — LIPID PANEL
Cholesterol: 205 mg/dL — ABNORMAL HIGH (ref 0–200)
HDL: 39 mg/dL — ABNORMAL LOW (ref >39)
LDL Cholesterol: 126 mg/dL — ABNORMAL HIGH (ref 0–99)
Total CHOL/HDL Ratio: 5.3 Ratio
Triglycerides: 199 mg/dL — ABNORMAL HIGH (ref <150)
VLDL: 40 mg/dL (ref 0–40)

## 2013-12-04 LAB — HEMOGLOBIN A1C
Hgb A1c MFr Bld: 7.2 % — ABNORMAL HIGH (ref <5.7)
Mean Plasma Glucose: 160 mg/dL — ABNORMAL HIGH (ref <117)

## 2013-12-04 NOTE — Progress Notes (Signed)
Patient ID: Ian Leblanc MRN: [id number], DOB: Apr 11, 1961, 53 y.o. Date of Encounter: @DATE @  Chief Complaint:  Chief Complaint  Patient presents with  . routine check up    is fasting    HPI: 53 y.o. year old white male  presents for routine followup office visit.  At his  office visit with me on 03/05/13 he had lost 13 pounds since his prior visit in April. He reported that he was walking every morning for 25 minutes. He was also doing a lot of work on his house: Pudding and heart border with floors et Ronney Asters. Also he made a lot of diet changes. Her breakfast he was eating his own bar. For lunch he eats with his mother and was eating vegetables which were cut in a healthy manner. For dinner his wife is following a carbohydrate handout in regards to making her better. Prior to that he had been eating a lot at dinnertime and eating late. He has started eating earlier and decreasing his portions. As well he decrease bread intake a lot. At visit 05/2013 his weight was only up 2 pounds compared to her was in July he is maintain this pretty well. Today his weight is the same as it was 05/2013--he is maintaining weight loss.  He has a grandson who lives nearby. He is now almost 55 years old. He sees him regularly. He also has a 24-year-old grandchild who lives in [place]. They come every August. They did come again this past August and he said he had a very busy, but very good time together.  Today he reports that the son who lives in [place] are now pregnant again. Baby is Due in October. They  plan to come here for a visit again this summer. Says that his son is a Radio producer and his wife can easily take off a high amount of work. Says that when they come here to visit their able to come and stay a whole month.  Since his last visit he did end up having to go to the emergency room with episode of chest pain.  had a myocardial perfusion scan on 07/23/13 which was normal. 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 Says that the cardiologist told him he had inhaled mold from the leaves and about was the cause of his symptoms.  He says that through all of those evaluations and  visits his blood pressure was good at all of those times.  Says none of his medications have been changed. Still taking all  medications same as at last visit. Still having no adverse effects.   Past Medical History  Diagnosis Date  . Hypertension   . Hyperlipidemia   . Sessile colonic polyp   . Hyperglycemia      Home Meds: See attached medication section for current medication list. Any medications entered into computer today will not appear on this note's list. The medications listed below were entered prior to today. Current Outpatient Prescriptions on File Prior to Visit  Medication Sig Dispense Refill  . aspirin 81 MG tablet Take 81 mg by mouth daily.      . benazepril (LOTENSIN) 20 MG tablet Take 1 tablet (20 mg total) by mouth daily.  90 tablet  0  . hydrochlorothiazide (MICROZIDE) 12.5 MG capsule Take 1 capsule (12.5 mg total) by mouth  daily.  90 capsule  0  . Multiple Vitamins-Minerals (MULTIVITAMIN PO) Take 1 tablet by mouth daily. Over 50 mvi qd      . nitroGLYCERIN (NITROSTAT) 0.4 MG SL tablet Place 1 tablet (0.4 mg total) under the tongue every 5 (five) minutes as needed for chest pain.  25 tablet  3  . Omega-3 Fatty Acids (OMEGA-3 FISH OIL PO) Take 600 mg by mouth daily.       . simvastatin (ZOCOR) 20 MG tablet Take 1 tablet (20 mg total) by mouth daily at 6 PM.  90 tablet  0   No current facility-administered medications on file prior to visit.    Allergies:  Allergies  Allergen Reactions  . Crestor [Rosuvastatin] Other (See Comments)    Severe myalgias    History   Social History  . Marital Status: Married    Spouse Name: N/A    Number of Children:  N/A  . Years of Education: N/A   Occupational History  . Not on file.   Social History Main Topics  . Smoking status: Former Smoker    Quit date: 07/17/2003  . Smokeless tobacco: Never Used  . Alcohol Use: No  . Drug Use: No  . Sexual Activity: Not on file   Other Topics Concern  . Not on file   Social History Narrative  . No narrative on file    History reviewed. No pertinent family history.   Review of Systems:  See HPI for pertinent ROS. All other ROS negative.    Physical Exam: Blood pressure 130/86, pulse 64, temperature 97.9 F (36.6 C), temperature source Oral, resp. rate 18, height 5\' 10"  (1.778 m), weight 243 lb (110.224 kg)., Body mass index is 34.87 kg/(m^2). General: WNWD WM. Appears in no acute distress. Neck: Supple. No thyromegaly. No lymphadenopathy. No Carotid bruits Lungs: Clear bilaterally to auscultation without wheezes, rales, or rhonchi. Breathing is unlabored. Heart: RRR with S1 S2. No murmurs, rubs, or gallops. Abdomen: Soft, non-tender, non-distended with normoactive bowel sounds. No hepatomegaly. No rebound/guarding. No obvious abdominal masses. Musculoskeletal:  Strength and tone normal for age. Extremities/Skin: Warm and dry. No clubbing or cyanosis. No edema. No rashes or suspicious lesions. Neuro: Alert and oriented X 3. Moves all extremities spontaneously. Gait is normal. CNII-XII grossly in tact. Psych:  Responds to questions appropriately with a normal affect.     ASSESSMENT AND PLAN:  53 y.o. year old male with  1. HTN (hypertension) His blood pressure  Is well controlled. Cont curent medications. Check labs monitor. - COMPLETE METABOLIC PANEL WITH GFR  2. HLD (hyperlipidemia) - COMPLETE METABOLIC PANEL WITH GFR - Lipid panel  3. Hyperglycemia A1c had increased April 2014. However he was an extremely compliant with diet and exercise and his A1c was down to normal range in July. - COMPLETE METABOLIC PANEL WITH GFR - Hemoglobin  A1c  4. Screening for prostate cancer Last PSA was 06/05/2013  5. Personal history of colonic polyps Colonoscopy 06/24/09 with polypectomy. Repeat 3-5 years. He did have repeat colonoscopy since his last visit with me in April. Patient states that this showed a very small polyp. Was told to repeat 5 years.  6. Immunizations: T. dap 03/2011.   Routine Office visit 6 months. F/U  sooner if needed.   Marin Olp Stephens, [place], [facility] 12/04/2013 8:29 AM

## 2013-12-05 ENCOUNTER — Telehealth: Payer: Self-pay | Admitting: Family Medicine

## 2013-12-05 DIAGNOSIS — E785 Hyperlipidemia, unspecified: Secondary | ICD-10-CM

## 2013-12-05 DIAGNOSIS — E1165 Type 2 diabetes mellitus with hyperglycemia: Principal | ICD-10-CM

## 2013-12-05 DIAGNOSIS — IMO0001 Reserved for inherently not codable concepts without codable children: Secondary | ICD-10-CM

## 2013-12-05 MED ORDER — METFORMIN HCL 500 MG PO TABS: 500.0000 mg | ORAL_TABLET | Freq: Two times a day (BID) | ORAL | Status: DC

## 2013-12-05 MED ORDER — SIMVASTATIN 40 MG PO TABS: 40.0000 mg | ORAL_TABLET | Freq: Every day | ORAL | Status: DC

## 2013-12-05 NOTE — Telephone Encounter (Signed)
Message copied by Olena Mater on Fri Dec 05, 2013 10:15 AM ------      Message from: Dena Billet      Created: Fri Dec 05, 2013  5:03 AM       Tell The patient that his A1c is up to 7.2 indicating increased blood sugars/progression of diabetes.      Tell him to start metformin 500 mg 1 by mouth twice a day.      Caution him that when he starts this it may cause some loose stools and GI upset but this should improve after a few days.      Tell him that we also need to increase his simvastatin from 20 mg to 40 mg.      TELL HIM TO HAVE F/U OV 3 MONTHS--NOT 6 MONTHS. COME FASTING TO THAT APPOINTMENT.       Sen prescription for both the metformin and the simvastatin 40 mg. Give 30 day supply with 2 refills.       ------

## 2013-12-05 NOTE — Telephone Encounter (Addendum)
RX's to pharmacy.  Have left pt message to call me back.  Need to have him schedule 3 mth follow up appt.

## 2013-12-08 NOTE — Telephone Encounter (Signed)
Pt aware of lab results and medication changes.  3 mth appt made.

## 2013-12-21 ENCOUNTER — Other Ambulatory Visit: Payer: Self-pay | Admitting: Physician Assistant

## 2014-01-07 ENCOUNTER — Other Ambulatory Visit: Payer: Self-pay | Admitting: Physician Assistant

## 2014-01-08 NOTE — Telephone Encounter (Signed)
Medication refilled per protocol. 

## 2014-03-09 ENCOUNTER — Encounter: Payer: Self-pay | Admitting: Physician Assistant

## 2014-03-09 ENCOUNTER — Ambulatory Visit (INDEPENDENT_AMBULATORY_CARE_PROVIDER_SITE_OTHER): Payer: Managed Care, Other (non HMO) | Admitting: Physician Assistant

## 2014-03-09 VITALS — BP 122/76 | HR 64 | Temp 98.2°F | Resp 18 | Wt 234.0 lb

## 2014-03-09 DIAGNOSIS — Z860100 Personal history of colon polyps, unspecified: Secondary | ICD-10-CM

## 2014-03-09 DIAGNOSIS — IMO0001 Reserved for inherently not codable concepts without codable children: Secondary | ICD-10-CM

## 2014-03-09 DIAGNOSIS — M542 Cervicalgia: Secondary | ICD-10-CM

## 2014-03-09 DIAGNOSIS — Z23 Encounter for immunization: Secondary | ICD-10-CM

## 2014-03-09 DIAGNOSIS — I10 Essential (primary) hypertension: Secondary | ICD-10-CM

## 2014-03-09 DIAGNOSIS — E1165 Type 2 diabetes mellitus with hyperglycemia: Secondary | ICD-10-CM

## 2014-03-09 DIAGNOSIS — R7309 Other abnormal glucose: Secondary | ICD-10-CM

## 2014-03-09 DIAGNOSIS — R739 Hyperglycemia, unspecified: Secondary | ICD-10-CM

## 2014-03-09 DIAGNOSIS — E785 Hyperlipidemia, unspecified: Secondary | ICD-10-CM

## 2014-03-09 LAB — COMPLETE METABOLIC PANEL WITH GFR
ALT: 57 U/L — ABNORMAL HIGH (ref 0–53)
AST: 26 U/L (ref 0–37)
Albumin: 4.5 g/dL (ref 3.5–5.2)
Alkaline Phosphatase: 61 U/L (ref 39–117)
BUN: 15 mg/dL (ref 6–23)
CO2: 23 mEq/L (ref 19–32)
Calcium: 9.4 mg/dL (ref 8.4–10.5)
Chloride: 103 mEq/L (ref 96–112)
Creat: 0.79 mg/dL (ref 0.50–1.35)
GFR, Est African American: >89 mL/min
GFR, Est Non African American: >89 mL/min
Glucose, Bld: 110 mg/dL — ABNORMAL HIGH (ref 70–99)
Potassium: 4 mEq/L (ref 3.5–5.3)
Sodium: 137 mEq/L (ref 135–145)
Total Bilirubin: 0.5 mg/dL (ref 0.2–1.2)
Total Protein: 6.8 g/dL (ref 6.0–8.3)

## 2014-03-09 LAB — LIPID PANEL
Cholesterol: 159 mg/dL (ref 0–200)
HDL: 36 mg/dL — ABNORMAL LOW (ref >39)
LDL Cholesterol: 90 mg/dL (ref 0–99)
Total CHOL/HDL Ratio: 4.4 Ratio
Triglycerides: 167 mg/dL — ABNORMAL HIGH (ref <150)
VLDL: 33 mg/dL (ref 0–40)

## 2014-03-09 LAB — HEMOGLOBIN A1C
Hgb A1c MFr Bld: 6.1 % — ABNORMAL HIGH (ref <5.7)
Mean Plasma Glucose: 128 mg/dL — ABNORMAL HIGH (ref <117)

## 2014-03-09 MED ORDER — METAXALONE 800 MG PO TABS: 800.0000 mg | ORAL_TABLET | Freq: Three times a day (TID) | ORAL | Status: DC

## 2014-03-09 MED ORDER — METFORMIN HCL 500 MG PO TABS: 500.0000 mg | ORAL_TABLET | Freq: Two times a day (BID) | ORAL | Status: DC

## 2014-03-09 NOTE — Progress Notes (Signed)
Patient ID: Ian Leblanc MRN: [id number], DOB: 02/14/1961, 53 y.o. Date of Encounter: @DATE @  Chief Complaint:  Chief Complaint  Patient presents with  . 53 mth checkup    is fasting    HPI: 53 y.o. year old white male  presents for routine followup office visit.  At his  office visit with me on 03/05/13 he had lost 13 pounds since his prior visit in April. He reported that he was walking every morning for 25 minutes. He was also doing a lot of work on his house: Pudding and heart border with floors et [employer]. Also he made a lot of diet changes. Her breakfast he was eating his own bar. For lunch he eats with his mother and was eating vegetables which were cut in a healthy manner. For dinner his wife is following a carbohydrate handout in regards to making her better. Prior to that he had been eating a lot at dinnertime and eating late. He has started eating earlier and decreasing his portions. As well he decrease bread intake a lot. At visit 05/2013 his weight was only up 2 pounds compared to her was in July he is maintain this pretty well.  Today 03/09/14 he reports he has lost 9 lbs since LOV here 4/15--says the only thing he has changed is that he has stopped eating at night. Says that per his dinner meal he has tried to decrease his portions. Says he was raised that you eat whatever is on your plate. Therefore he is recently decreased the amount that he is putting on his plate !!  Also, not eating any other snacks after dinner.  At his last office visit 4/15 labs revealed A1c 7.2. LDL 126. At that time recommended starting metformin 500 mg twice a day. Also to increase simvastatin from 20 mg to 40 mg. Today he reports that he made both of these changes. Says that he never had any diarrhea or other GI adverse effects with the metformin. He has noticed no symptoms with taking the metformin and the increase simvastatin.  He has a grandson who lives nearby. He is 55 years old. He sees him  regularly.  He also has a 62-year-old grandchild who lives in [place]. They come every August. They did come again this past August and he said he had a very busy, but very good time together.  Today he reports that the son who lives in [place] are now pregnant again. Baby is Due in October. They  plan to come here for a visit again this summer. Says that his son is a Radio producer and his wife can easily take off a high amount of work. Says that when they come here to visit they are able to come and stay a whole month. TODAY--reports that they are coming here in 2 days and will be staying for 21 days. Says that they have just "hanging around" here--not going to the beach this time.   06/2013 he went to the emergency room with episode of chest pain.  had a myocardial perfusion scan on 07/23/13 which was normal. 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 Says that the cardiologist told him he had inhaled mold from the leaves and about was the cause of his symptoms.  He says that through all of those evaluations and  visits his blood pressure  was good at all of those times.    Past Medical History  Diagnosis Date  . Hypertension   . Hyperlipidemia   . Sessile colonic polyp   . Hyperglycemia      Home Meds:  Outpatient Prescriptions Prior to Visit  Medication Sig Dispense Refill  . aspirin 81 MG tablet Take 81 mg by mouth daily.      . benazepril (LOTENSIN) 20 MG tablet TAKE ONE TABLET BY MOUTH ONCE DAILY  90 tablet  0  . hydrochlorothiazide (MICROZIDE) 12.5 MG capsule TAKE ONE CAPSULE BY MOUTH ONCE DAILY  90 capsule  1  . Multiple Vitamins-Minerals (MULTIVITAMIN PO) Take 1 tablet by mouth daily. Over 55 mvi qd      . nitroGLYCERIN (NITROSTAT) 0.4 MG SL tablet Place 1 tablet (0.4 mg total) under the tongue every 5 (five) minutes as needed for chest pain.  25 tablet   3  . Omega-3 Fatty Acids (OMEGA-3 FISH OIL PO) Take 600 mg by mouth daily.       . simvastatin (ZOCOR) 40 MG tablet Take 1 tablet (40 mg total) by mouth daily at 6 PM.  90 tablet  0  . metFORMIN (GLUCOPHAGE) 500 MG tablet Take 1 tablet (500 mg total) by mouth 2 (two) times daily with a meal.  180 tablet  0   No facility-administered medications prior to visit.     Allergies:  Allergies  Allergen Reactions  . Crestor [Rosuvastatin] Other (See Comments)    Severe myalgias    History   Social History  . Marital Status: Married    Spouse Name: N/A    Number of Children: N/A  . Years of Education: N/A   Occupational History  . Not on file.   Social History Main Topics  . Smoking status: Former Smoker    Quit date: 07/17/2003  . Smokeless tobacco: Never Used  . Alcohol Use: No  . Drug Use: No  . Sexual Activity: Not on file   Other Topics Concern  . Not on file   Social History Narrative  . No narrative on file    History reviewed. No pertinent family history.   Review of Systems:  See HPI for pertinent ROS. All other ROS negative.    Physical Exam: Blood pressure 122/76, pulse 64, temperature 98.2 F (36.8 C), temperature source Oral, resp. rate 18, weight 234 lb (106.142 kg)., Body mass index is 33.58 kg/(m^2). General: WNWD WM. Appears in no acute distress. Neck: Supple. No thyromegaly. No lymphadenopathy. No Carotid bruits Lungs: Clear bilaterally to auscultation without wheezes, rales, or rhonchi. Breathing is unlabored. Heart: RRR with S1 S2. No murmurs, rubs, or gallops. Abdomen: Soft, non-tender, non-distended with normoactive bowel sounds. No hepatomegaly. No rebound/guarding. No obvious abdominal masses. Musculoskeletal:  Strength and tone normal for age. Extremities/Skin: Warm and dry. No clubbing or cyanosis. No edema. No rashes or suspicious lesions. Neuro: Alert and oriented X 3. Moves all extremities spontaneously. Gait is normal. CNII-XII grossly in  tact. Psych:  Responds to questions appropriately with a normal affect.     ASSESSMENT AND PLAN:  53 y.o. year old male with  1. HTN (hypertension) His blood pressure  Is well controlled. Cont curent medications. Check labs monitor. - COMPLETE METABOLIC PANEL WITH GFR  2. HLD (hyperlipidemia) 11/2013 increased simvastatin from 20 mg to 40 mg. Recheck labs now on increased dose. - COMPLETE METABOLIC PANEL WITH GFR - Lipid panel  3. DIABETES 11/2013 started Metformin.  Also weight down 9  pounds compared to 4/15 secondary to diet changes at night. Recheck Lab now. -COMPLETE METABOLIC PANEL WITH GFR - Hemoglobin A1c  Give Pneumovax 23 now.  He is on aspirin 81 mg daily He is on ACE inhibitor He is on statin therapy  4. Screening for prostate cancer Last PSA was 06/05/2013  5. Personal history of colonic polyps Colonoscopy 06/24/09 with polypectomy. Repeat 3-5 years. He did have repeat colonoscopy since his last visit with me in April. Patient states that this showed a very small polyp. Was told to repeat 5 years.  6. Immunizations: T. dap 03/2011. Pneumovax 23--Give now-- 03/09/2014  7. Neck Pain --Skelaxin 800mg  Today he says that he has had a "crick in his neck" for a couple weeks now and is bugging him. Prescribed muscle relaxer. Cautioned that it can cause drowsiness. Also apply heat to the area and stretch the neck.  Routine Office visit 3 months. F/U  sooner if needed.   Marin Olp [place], [place], [facility] 03/09/2014 8:23 AM

## 2014-03-19 ENCOUNTER — Encounter: Payer: Self-pay | Admitting: *Deleted

## 2014-03-24 ENCOUNTER — Telehealth: Payer: Self-pay | Admitting: *Deleted

## 2014-03-24 DIAGNOSIS — E785 Hyperlipidemia, unspecified: Secondary | ICD-10-CM

## 2014-03-24 MED ORDER — SIMVASTATIN 40 MG PO TABS: 40.0000 mg | ORAL_TABLET | Freq: Every day | ORAL | Status: DC

## 2014-03-24 NOTE — Telephone Encounter (Signed)
Received fax from pts pharmacy for refill on Simvastatin, take one tablet by mouth daily, last ov was 03/09/14 refilled for 90 day supply.

## 2014-04-19 ENCOUNTER — Other Ambulatory Visit: Payer: Self-pay | Admitting: Physician Assistant

## 2014-04-20 NOTE — Telephone Encounter (Signed)
Refill appropriate and filled per protocol. 

## 2014-06-08 ENCOUNTER — Ambulatory Visit (INDEPENDENT_AMBULATORY_CARE_PROVIDER_SITE_OTHER): Payer: Managed Care, Other (non HMO) | Admitting: Physician Assistant

## 2014-06-08 ENCOUNTER — Encounter: Payer: Self-pay | Admitting: Physician Assistant

## 2014-06-08 ENCOUNTER — Encounter: Payer: Self-pay | Admitting: Family Medicine

## 2014-06-08 VITALS — BP 128/84 | HR 64 | Temp 98.1°F | Resp 18 | Wt 237.0 lb

## 2014-06-08 DIAGNOSIS — I10 Essential (primary) hypertension: Secondary | ICD-10-CM

## 2014-06-08 DIAGNOSIS — Z86010 Personal history of colonic polyps: Secondary | ICD-10-CM

## 2014-06-08 DIAGNOSIS — R739 Hyperglycemia, unspecified: Secondary | ICD-10-CM

## 2014-06-08 DIAGNOSIS — E785 Hyperlipidemia, unspecified: Secondary | ICD-10-CM

## 2014-06-08 DIAGNOSIS — E119 Type 2 diabetes mellitus without complications: Secondary | ICD-10-CM

## 2014-06-08 LAB — HEMOGLOBIN A1C, FINGERSTICK: Hgb A1C (fingerstick): 6.3 % — ABNORMAL HIGH (ref <5.7)

## 2014-06-08 NOTE — Progress Notes (Signed)
Patient ID: Ian Leblanc MRN: [id number], DOB: 21-Jul-1961, 53 y.o. Date of Encounter: @DATE @  Chief Complaint:  Chief Complaint  Patient presents with  . 4 mth check up    is fasting    HPI: 53 y.o. year old white male  presents for routine followup office visit.  At his  office visit with me on 03/05/13 he had lost 13 pounds since his prior visit in April. He reported that he was walking every morning for 25 minutes. He was also doing a lot of work on his house.  Also he made a lot of diet changes. For breakfast he was eating  bar. For lunch he eats with his mother and was eating vegetables which were cooked in a healthy manner. For dinner his wife is following a carbohydrate handout in regards to what she was cooking. Prior to that he had been eating a lot at dinnertime and eating late. He has started eating earlier and decreasing his portions. As well he decrease bread intake a lot. At visit 05/2013 his weight was only up 2 pounds compared to her was in July he is maintain this pretty well.  At [place] 03/09/14 he reports he has lost 9 lbs since LOV here 4/15--says the only thing he has changed is that he has stopped eating at night. Says that per his dinner meal he has tried to decrease his portions. Says he was raised that you eat whatever is on your plate. Therefore he is recently decreased the amount that he is putting on his plate !!  Also, not eating any other snacks after dinner.  At his  office visit 4/15 labs revealed A1c 7.2. LDL 126. At that time recommended starting metformin 500 mg twice a day. Also to increase simvastatin from 20 mg to 40 mg. At f/u 02/2014 he reports that he made both of these changes. Says that he never had any diarrhea or other GI adverse effects with the metformin. He has noticed no symptoms with taking the metformin and the increase simvastatin.  He has a grandson who lives nearby. He is 64 years old. He sees him regularly.  His son who lives in [place] just  had another baby (another boy) yesterday.  He also has a 18-year-old grandchild who lives in [place]. They come every August. They did come again this past August. Says that they just hung out around here. Says that son does not  like the beach. They went to the beach with the other grandson who lives here locally after the other family went back to [place].  Says that his son is a Radio producer and his wife can easily take off a high amount of work. Says that when they come here to visit they are able to come and stay a whole month.   06/2013 he went to the emergency room with episode of chest pain.  had a myocardial perfusion scan on 07/23/13 which was normal. 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 Says that the cardiologist told him he had inhaled mold from the leaves and about was the cause of his symptoms.     Past Medical History  Diagnosis Date  . Hypertension   . Hyperlipidemia   . Sessile colonic polyp   . Hyperglycemia      Home Meds:  Outpatient Prescriptions Prior to Visit  Medication Sig Dispense Refill  . aspirin 81 MG tablet Take 81 mg by mouth daily.      . benazepril (LOTENSIN) 20 MG tablet TAKE ONE TABLET BY MOUTH ONCE DAILY  90 tablet  0  . hydrochlorothiazide (MICROZIDE) 12.5 MG capsule TAKE ONE CAPSULE BY MOUTH ONCE DAILY  90 capsule  1  . metaxalone (SKELAXIN) 800 MG tablet Take 1 tablet (800 mg total) by mouth 3 (three) times daily.  30 tablet  1  . metFORMIN (GLUCOPHAGE) 500 MG tablet Take 1 tablet (500 mg total) by mouth 2 (two) times daily with a meal.  180 tablet  1  . Multiple Vitamins-Minerals (MULTIVITAMIN PO) Take 1 tablet by mouth daily. Over 50 mvi qd      . nitroGLYCERIN (NITROSTAT) 0.4 MG SL tablet Place 1 tablet (0.4 mg total) under the tongue every 5 (five) minutes as needed for chest pain.  25 tablet  3  . Omega-3 Fatty Acids  (OMEGA-3 FISH OIL PO) Take 600 mg by mouth daily.       . simvastatin (ZOCOR) 40 MG tablet Take 1 tablet (40 mg total) by mouth daily at 6 PM.  90 tablet  0   No facility-administered medications prior to visit.     Allergies:  Allergies  Allergen Reactions  . Crestor [Rosuvastatin] Other (See Comments)    Severe myalgias    History   Social History  . Marital Status: Married    Spouse Name: N/A    Number of Children: N/A  . Years of Education: N/A   Occupational History  . Not on file.   Social History Main Topics  . Smoking status: Former Smoker    Quit date: 07/17/2003  . Smokeless tobacco: Never Used  . Alcohol Use: No  . Drug Use: No  . Sexual Activity: Not on file   Other Topics Concern  . Not on file   Social History Narrative  . No narrative on file    History reviewed. No pertinent family history.   Review of Systems:  See HPI for pertinent ROS. All other ROS negative.    Physical Exam: Blood pressure 128/84, pulse 64, temperature 98.1 F (36.7 C), temperature source Oral, resp. rate 18, weight 237 lb (107.502 kg)., Body mass index is 34.01 kg/(m^2). General: WNWD WM. Appears in no acute distress. Neck: Supple. No thyromegaly. No lymphadenopathy. No Carotid bruits Lungs: Clear bilaterally to auscultation without wheezes, rales, or rhonchi. Breathing is unlabored. Heart: RRR with S1 S2. No murmurs, rubs, or gallops. Abdomen: Soft, non-tender, non-distended with normoactive bowel sounds. No hepatomegaly. No rebound/guarding. No obvious abdominal masses. Musculoskeletal:  Strength and tone normal for age. Extremities/Skin: Warm and dry.  No edema.  Neuro: Alert and oriented X 3. Moves all extremities spontaneously. Gait is normal. CNII-XII grossly in tact. Psych:  Responds to questions appropriately with a normal affect.     ASSESSMENT AND PLAN:  53 y.o. year old male with  1. HTN (hypertension) His blood pressure  Is well controlled. Cont curent  medications.  2. HLD (hyperlipidemia) 11/2013 increased simvastatin from 20 mg to 40 mg.  Recheked labs 02/2014--LDL went from 126 down to 90. LFTs normal.   3. DIABETES 11/2013 started Metformin.  Also weight down 9 pounds compared to 4/15 secondary to diet changes at night. 02/2014 A1C went down to 6.1 (from 7.2-- 11/2013) - Hemoglobin A1c  Gave Pneumovax 23 02/2014.  He is on aspirin 81 mg daily He is on ACE inhibitor He  is on statin therapy  4. Screening for prostate cancer Last PSA was 06/05/2013  5. Personal history of colonic polyps Colonoscopy 06/24/09 with polypectomy. Repeat 3-5 years. He did have repeat colonoscopy since his last visit with me in April. Patient states that this showed a very small polyp. Was told to repeat 5 years.  6. Immunizations: T. dap 03/2011. Pneumovax 23--Gave here-- 03/09/2014 Influenza Vaccine--states that he received this at [facility] 2015  Routine Office visit 3 months. F/U  sooner if needed.   Signed, [address] [place], [place], [facility] 06/08/2014 8:18 AM

## 2014-06-10 ENCOUNTER — Ambulatory Visit: Payer: Managed Care, Other (non HMO) | Admitting: Physician Assistant

## 2014-07-07 ENCOUNTER — Other Ambulatory Visit: Payer: Self-pay | Admitting: Family Medicine

## 2014-07-07 DIAGNOSIS — E119 Type 2 diabetes mellitus without complications: Secondary | ICD-10-CM

## 2014-07-07 DIAGNOSIS — E785 Hyperlipidemia, unspecified: Secondary | ICD-10-CM

## 2014-07-07 DIAGNOSIS — I10 Essential (primary) hypertension: Secondary | ICD-10-CM

## 2014-07-07 MED ORDER — METFORMIN HCL 500 MG PO TABS: 500.0000 mg | ORAL_TABLET | Freq: Two times a day (BID) | ORAL | Status: DC

## 2014-07-07 MED ORDER — BENAZEPRIL HCL 20 MG PO TABS: 20.0000 mg | ORAL_TABLET | Freq: Every day | ORAL | Status: DC

## 2014-07-07 MED ORDER — SIMVASTATIN 40 MG PO TABS
40.0000 mg | ORAL_TABLET | Freq: Every day | ORAL | Status: DC
Start: 2014-07-07 — End: 2014-12-11

## 2014-07-07 MED ORDER — HYDROCHLOROTHIAZIDE 12.5 MG PO CAPS: 12.5000 mg | ORAL_CAPSULE | Freq: Every day | ORAL | Status: DC

## 2014-11-18 ENCOUNTER — Other Ambulatory Visit: Payer: Self-pay | Admitting: Physician Assistant

## 2014-11-19 NOTE — Telephone Encounter (Signed)
Medication refilled per protocol. 

## 2014-12-09 ENCOUNTER — Ambulatory Visit (INDEPENDENT_AMBULATORY_CARE_PROVIDER_SITE_OTHER): Payer: Managed Care, Other (non HMO) | Admitting: Physician Assistant

## 2014-12-09 ENCOUNTER — Encounter: Payer: Self-pay | Admitting: Physician Assistant

## 2014-12-09 VITALS — BP 122/78 | HR 68 | Temp 98.1°F | Resp 16 | Ht 70.0 in | Wt 236.0 lb

## 2014-12-09 DIAGNOSIS — E119 Type 2 diabetes mellitus without complications: Secondary | ICD-10-CM | POA: Diagnosis not present

## 2014-12-09 DIAGNOSIS — I10 Essential (primary) hypertension: Secondary | ICD-10-CM

## 2014-12-09 DIAGNOSIS — E785 Hyperlipidemia, unspecified: Secondary | ICD-10-CM

## 2014-12-09 DIAGNOSIS — Z86010 Personal history of colonic polyps: Secondary | ICD-10-CM | POA: Diagnosis not present

## 2014-12-09 DIAGNOSIS — Z125 Encounter for screening for malignant neoplasm of prostate: Secondary | ICD-10-CM | POA: Diagnosis not present

## 2014-12-09 HISTORY — DX: Encounter for screening for malignant neoplasm of prostate: Z12.5

## 2014-12-09 LAB — COMPLETE METABOLIC PANEL WITH GFR
ALT: 77 U/L — ABNORMAL HIGH (ref 0–53)
AST: 40 U/L — ABNORMAL HIGH (ref 0–37)
Albumin: 4.4 g/dL (ref 3.5–5.2)
Alkaline Phosphatase: 56 U/L (ref 39–117)
BUN: 16 mg/dL (ref 6–23)
CO2: 20 mEq/L (ref 19–32)
Calcium: 9.4 mg/dL (ref 8.4–10.5)
Chloride: 105 mEq/L (ref 96–112)
Creat: 0.75 mg/dL (ref 0.50–1.35)
GFR, Est African American: >89 mL/min
GFR, Est Non African American: >89 mL/min
Glucose, Bld: 119 mg/dL — ABNORMAL HIGH (ref 70–99)
Potassium: 4 mEq/L (ref 3.5–5.3)
Sodium: 137 mEq/L (ref 135–145)
Total Bilirubin: 0.6 mg/dL (ref 0.2–1.2)
Total Protein: 6.8 g/dL (ref 6.0–8.3)

## 2014-12-09 LAB — LIPID PANEL
Cholesterol: 189 mg/dL (ref 0–200)
HDL: 39 mg/dL — ABNORMAL LOW (ref >=40)
LDL Cholesterol: 117 mg/dL — ABNORMAL HIGH (ref 0–99)
Total CHOL/HDL Ratio: 4.8 Ratio
Triglycerides: 164 mg/dL — ABNORMAL HIGH (ref <150)
VLDL: 33 mg/dL (ref 0–40)

## 2014-12-09 LAB — HEMOGLOBIN A1C
Hgb A1c MFr Bld: 6.7 % — ABNORMAL HIGH (ref <5.7)
Mean Plasma Glucose: 146 mg/dL — ABNORMAL HIGH (ref <117)

## 2014-12-09 LAB — MICROALBUMIN, URINE: Microalb, Ur: 0.8 mg/dL (ref <2.0)

## 2014-12-09 NOTE — Progress Notes (Signed)
Patient ID: Ian Leblanc MRN: [id number], DOB: 1961/01/29, 54 y.o. Date of Encounter: @DATE @  Chief Complaint:  Chief Complaint  Patient presents with  . Med check    Pt fasting    HPI: 54 y.o. year old white male  presents for routine followup office visit.  At his  office visit with me on 03/05/13 he had lost 13 pounds since his prior visit in April. He reported that he was walking every morning for 25 minutes. He was also doing a lot of work on his house.  Also he made a lot of diet changes. For breakfast he was eating  bar. For lunch he eats with his mother and was eating vegetables which were cooked in a healthy manner. For dinner his wife is following a carbohydrate handout in regards to what she was cooking. Prior to that he had been eating a lot at dinnertime and eating late. He has started eating earlier and decreasing his portions. As well he decrease bread intake a lot. At visit 05/2013 his weight was only up 2 pounds compared to her was in July he is maintain this pretty well.  At [place] 03/09/14 he reports he has lost 9 lbs since LOV here 4/15--says the only thing he has changed is that he has stopped eating at night. Says that per his dinner meal he has tried to decrease his portions. Says he was raised that you eat whatever is on your plate. Therefore he is recently decreased the amount that he is putting on his plate !!  Also, not eating any other snacks after dinner.  At his  office visit 4/15 labs revealed A1c 7.2. LDL 126. At that time recommended starting metformin 500 mg twice a day. Also to increase simvastatin from 20 mg to 40 mg. At f/u 02/2014 he reports that he made both of these changes. Says that he never had any diarrhea or other GI adverse effects with the metformin. He has noticed no symptoms with taking the metformin and the increase simvastatin.  At [place] 11/2014--says that he has lost 6 pounds since his last visit. Make sure he walks some each day. Also is playing  golf 9 holes 2 days a week and walks this 9 holes. All tumor his grafts.  He has a grandson who lives nearby. He is 56 years old. He sees him regularly.  His son who lives in [place]  had another baby --born 05/2014.  He also has a 21-year-old grandchild who lives in [place]. They usually come every August. They did come again this August 2015. However, at the visit in April 2016 patient states that this year they are not coming  during the summer. Instead they are going to come Christmas. This will be the first time in 6 years ago he has been for Christmas so patient's wife is very excited.   In past, has told me  that his son is a Radio producer and his wife can easily take off a high amount of work. Says that when they come here to visit they are able to come and stay a whole month.   06/2013 he went to the emergency room with episode of chest pain.  had a myocardial perfusion scan on 07/23/13 which was normal. 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 Says that the cardiologist told him he  had inhaled mold from the leaves and about was the cause of his symptoms.     Past Medical History  Diagnosis Date  . Hypertension   . Hyperlipidemia   . Sessile colonic polyp   . Hyperglycemia      Home Meds:  Outpatient Prescriptions Prior to Visit  Medication Sig Dispense Refill  . aspirin 81 MG tablet Take 81 mg by mouth daily.    . benazepril (LOTENSIN) 20 MG tablet TAKE ONE TABLET BY MOUTH DAILY 90 tablet 0  . hydrochlorothiazide (MICROZIDE) 12.5 MG capsule Take 1 capsule (12.5 mg total) by mouth daily. 90 capsule 1  . metaxalone (SKELAXIN) 800 MG tablet Take 1 tablet (800 mg total) by mouth 3 (three) times daily. 30 tablet 1  . metFORMIN (GLUCOPHAGE) 500 MG tablet Take 1 tablet (500 mg total) by mouth 2 (two) times daily with a meal. 180 tablet 1  . Multiple  Vitamins-Minerals (MULTIVITAMIN PO) Take 1 tablet by mouth daily. Over 50 mvi qd    . nitroGLYCERIN (NITROSTAT) 0.4 MG SL tablet Place 1 tablet (0.4 mg total) under the tongue every 5 (five) minutes as needed for chest pain. 25 tablet 3  . Omega-3 Fatty Acids (OMEGA-3 FISH OIL PO) Take 600 mg by mouth daily.     . simvastatin (ZOCOR) 40 MG tablet Take 1 tablet (40 mg total) by mouth daily at 6 PM. 90 tablet 1   No facility-administered medications prior to visit.     Allergies:  Allergies  Allergen Reactions  . Crestor [Rosuvastatin] Other (See Comments)    Severe myalgias    History   Social History  . Marital Status: Married    Spouse Name: N/A  . Number of Children: N/A  . Years of Education: N/A   Occupational History  . Not on file.   Social History Main Topics  . Smoking status: Former Smoker    Quit date: 07/17/2003  . Smokeless tobacco: Never Used  . Alcohol Use: No  . Drug Use: No  . Sexual Activity: Not on file   Other Topics Concern  . Not on file   Social History Narrative    History reviewed. No pertinent family history.   Review of Systems:  See HPI for pertinent ROS. All other ROS negative.    Physical Exam: Blood pressure 122/78, pulse 68, temperature 98.1 F (36.7 C), temperature source Oral, resp. rate 16, height 5\' 10"  (1.778 m), weight 236 lb (107.049 kg)., Body mass index is 33.86 kg/(m^2). General: WNWD WM. Appears in no acute distress. Neck: Supple. No thyromegaly. No lymphadenopathy. No Carotid bruits Lungs: Clear bilaterally to auscultation without wheezes, rales, or rhonchi. Breathing is unlabored. Heart: RRR with S1 S2. No murmurs, rubs, or gallops. Abdomen: Soft, non-tender, non-distended with normoactive bowel sounds. No hepatomegaly. No rebound/guarding. No obvious abdominal masses. Musculoskeletal:  Strength and tone normal for age. Extremities/Skin: Warm and dry.  No edema.  Neuro: Alert and oriented X 3. Moves all extremities  spontaneously. Gait is normal. CNII-XII grossly in tact. Psych:  Responds to questions appropriately with a normal affect.     ASSESSMENT AND PLAN:  54 y.o. year old male with  1. HTN (hypertension) His blood pressure  Is well controlled. Cont curent medications.  2. HLD (hyperlipidemia) 11/2013 increased simvastatin from 20 mg to 40 mg.  Recheked labs 02/2014--LDL went from 126 down to 90. LFTs normal.   3. DIABETES 11/2013 started Metformin.  Also weight down 9 pounds compared to 4/15 secondary  to diet changes at night. 02/2014 A1C went down to 6.1 (from 7.2-- 11/2013) - Hemoglobin A1c -Micro Albumin---12/09/2014  Gave Pneumovax 23 02/2014.  He is on aspirin 81 mg daily He is on ACE inhibitor He is on statin therapy  Office visit 12/09/2014 discussed diabetic eye exam. Wears glasses so he has an annual on exam. Next exam is due in August. Told him to make sure to inform them that he has diabetes so that they can check for any diabetic changes.  4. Screening for prostate cancer Last PSA was 06/05/2013 Recheck now--12/09/2014  5. Personal history of colonic polyps Colonoscopy 06/24/09 with polypectomy. Repeat 3-5 years. He did have repeat colonoscopy between April 2015 - October 2015 . Patient states that this showed a very small polyp. Was told to repeat 5 years.  6. Immunizations: Influenza Vaccine--states that he received this at [facility]--Fall 2015 T. dap 03/2011. Pneumovax 23--Gave here-- 03/09/2014             No Other pneumonia vaccine indicated until age 3 Zostavax--not indicated until age 42. Discuss then.  At office visit 12/09/14 I discussed with patient that if labs remain stable he could wait 6 months for visit. He prefers to be seen every 3 months to closely monitor things. Routine Office visit 3 months. F/U  sooner if needed.   Signed, [address] [place], [place], [facility] 12/09/2014 8:22 AM

## 2014-12-10 LAB — PSA: PSA: 0.81 ng/mL (ref <=4.00)

## 2014-12-11 ENCOUNTER — Telehealth: Payer: Self-pay | Admitting: Family Medicine

## 2014-12-11 MED ORDER — SIMVASTATIN 80 MG PO TABS: 80.0000 mg | ORAL_TABLET | Freq: Every day | ORAL | Status: DC

## 2014-12-11 NOTE — Telephone Encounter (Signed)
Pt aware of lab results and provider recommendations.  Rx to pharmacy. Med list updated

## 2014-12-11 NOTE — Telephone Encounter (Signed)
-----   Message from Orlena Sheldon, PA-C sent at 12/10/2014  8:01 AM EDT ----- Increase Zocor to 80 mg.----- remove the 40 mg dose and add the 80 mg to the medicine list. Remainder of labs look good and all are stable. Continue all other medicines the same.

## 2014-12-23 ENCOUNTER — Ambulatory Visit (INDEPENDENT_AMBULATORY_CARE_PROVIDER_SITE_OTHER): Payer: Managed Care, Other (non HMO) | Admitting: Physician Assistant

## 2014-12-23 ENCOUNTER — Encounter: Payer: Self-pay | Admitting: Physician Assistant

## 2014-12-23 VITALS — BP 142/90 | HR 72 | Temp 98.3°F | Resp 18 | Wt 242.0 lb

## 2014-12-23 DIAGNOSIS — M25512 Pain in left shoulder: Secondary | ICD-10-CM | POA: Diagnosis not present

## 2014-12-23 MED ORDER — TRAMADOL HCL 50 MG PO TABS: 50.0000 mg | ORAL_TABLET | Freq: Three times a day (TID) | ORAL | Status: DC | PRN

## 2014-12-23 NOTE — Progress Notes (Signed)
Patient ID: Ian Leblanc MRN: [id number], DOB: Aug 22, 1960, 54 y.o. Date of Encounter: 12/23/2014, 11:39 AM    Chief Complaint:  Chief Complaint  Patient presents with  . left shoulder/elbow pain    since doing heavy work Saturday     HPI: 54 y.o. year old white male says that he has played golf some. Says that while he is playing golf, he has no pain. Says that this past Saturday he was having to pull on large wrenches with a lot of force. Says that yesterday he was having to work on his lawnmower. Says he is not sure if some of these activities have been the cause of these symptoms. Says that he is having discomfort in the left scapula area. Seems to be radiating down towards the left elbow. Says that he can move the left shoulder in all directions with no sharp pain. However when he is just sitting such as doing work at his desk just feels like the whole left shoulder area is heavy and weighted down and tight.  Says I had prescribed muscle relaxer Skelaxin for a "crick in the neck in the past. Just got that refilled and that seems to help some and is taking this 3 times a day. He is also taking Advil at the maximum dose recommended on the bottle. Says that he is applying heat and that does seem to help. Says he mostly notices just feeling uncomfortable especially when he walks in the morning just that whole left shoulder area feels tight and pulled down and also feels this way when he is sitting in his chair at work.     Home Meds:   Outpatient Prescriptions Prior to Visit  Medication Sig Dispense Refill  . aspirin 81 MG tablet Take 81 mg by mouth daily.    . benazepril (LOTENSIN) 20 MG tablet TAKE ONE TABLET BY MOUTH DAILY 90 tablet 0  . hydrochlorothiazide (MICROZIDE) 12.5 MG capsule Take 1 capsule (12.5 mg total) by mouth daily. 90 capsule 1  . metaxalone (SKELAXIN) 800 MG tablet Take 1 tablet (800 mg total) by mouth 3 (three) times daily. 30 tablet 1  . metFORMIN (GLUCOPHAGE)  500 MG tablet Take 1 tablet (500 mg total) by mouth 2 (two) times daily with a meal. 180 tablet 1  . Multiple Vitamins-Minerals (MULTIVITAMIN PO) Take 1 tablet by mouth daily. Over 50 mvi qd    . nitroGLYCERIN (NITROSTAT) 0.4 MG SL tablet Place 1 tablet (0.4 mg total) under the tongue every 5 (five) minutes as needed for chest pain. 25 tablet 3  . Omega-3 Fatty Acids (OMEGA-3 FISH OIL PO) Take 600 mg by mouth daily.     . simvastatin (ZOCOR) 80 MG tablet Take 1 tablet (80 mg total) by mouth daily. 90 tablet 1   No facility-administered medications prior to visit.    Allergies:  Allergies  Allergen Reactions  . Crestor [Rosuvastatin] Other (See Comments)    Severe myalgias      Review of Systems: See HPI for pertinent ROS. All other ROS negative.    Physical Exam: Blood pressure 142/90, pulse 72, temperature 98.3 F (36.8 C), temperature source Oral, resp. rate 18, weight 242 lb (109.77 kg)., Body mass index is 34.72 kg/(m^2). General:  WNWD WM. Appears in no acute distress. Neck: Supple. No thyromegaly. No lymphadenopathy. Lungs: Clear bilaterally to auscultation without wheezes, rales, or rhonchi. Breathing is unlabored. Heart: Regular rhythm. No murmurs, rubs, or gallops. Msk:  Strength and tone normal for  age. There is some tenderness with palpation of the left scapula area and left neck. Range of motion of the left shoulder is normal. Left elbow--inspection is normal. Palpation of the medial epicondyles and lateral epicondyles are normal with no significant tenderness. Extremities/Skin: Warm and dry. Neuro: Alert and oriented X 3. Moves all extremities spontaneously. Gait is normal. CNII-XII grossly in tact. Psych:  Responds to questions appropriately with a normal affect.     ASSESSMENT AND PLAN:  54 y.o. year old male with  1. Left shoulder pain Discussed applying heat to the area as much as possible. Also apply very warm water to that area when in the shower. Stretch  the area as much as possible. i demonstrated different stretches for the area. Continue taking Skelaxin 3 times a day. continue Advil. Will add tramadol to ease the discomfort that he is experiencing. If symptoms worsen or are not resolving over the next 1-2 weeks, then follow-up. - traMADol (ULTRAM) 50 MG tablet; Take 1 tablet (50 mg total) by mouth every 8 (eight) hours as needed.  Dispense: 60 tablet; Refill: 0   Signed, [address] [place], [place], [facility] 12/23/2014 11:39 AM

## 2015-01-01 ENCOUNTER — Other Ambulatory Visit: Payer: Self-pay | Admitting: Physician Assistant

## 2015-01-01 NOTE — Telephone Encounter (Signed)
Refill appropriate and filled per protocol. 

## 2015-01-11 ENCOUNTER — Encounter: Payer: Self-pay | Admitting: Physician Assistant

## 2015-01-11 ENCOUNTER — Other Ambulatory Visit: Payer: Self-pay | Admitting: Family Medicine

## 2015-01-11 ENCOUNTER — Ambulatory Visit
Admission: RE | Admit: 2015-01-11 | Discharge: 2015-01-11 | Disposition: A | Discharge status: Home / Self Care | Payer: Managed Care, Other (non HMO) | Source: Ambulatory Visit | Attending: Physician Assistant | Admitting: Physician Assistant

## 2015-01-11 ENCOUNTER — Ambulatory Visit (INDEPENDENT_AMBULATORY_CARE_PROVIDER_SITE_OTHER): Payer: Managed Care, Other (non HMO) | Admitting: Physician Assistant

## 2015-01-11 VITALS — BP 132/80 | HR 72 | Temp 98.2°F | Resp 19 | Wt 238.0 lb

## 2015-01-11 DIAGNOSIS — R202 Paresthesia of skin: Secondary | ICD-10-CM | POA: Diagnosis not present

## 2015-01-11 DIAGNOSIS — M542 Cervicalgia: Secondary | ICD-10-CM

## 2015-01-11 DIAGNOSIS — R937 Abnormal findings on diagnostic imaging of other parts of musculoskeletal system: Secondary | ICD-10-CM

## 2015-01-11 IMAGING — CR DG CERVICAL SPINE COMPLETE 4+V
6 series · 6 of 6 positions shown · non-contrast
Comparison: None.

CLINICAL DATA: Paresthesias of the left arm, 3 weeks of left-sided
neck pain, no known injury

EXAM:
CERVICAL SPINE  4+ VIEWS

[w c-spine lat]
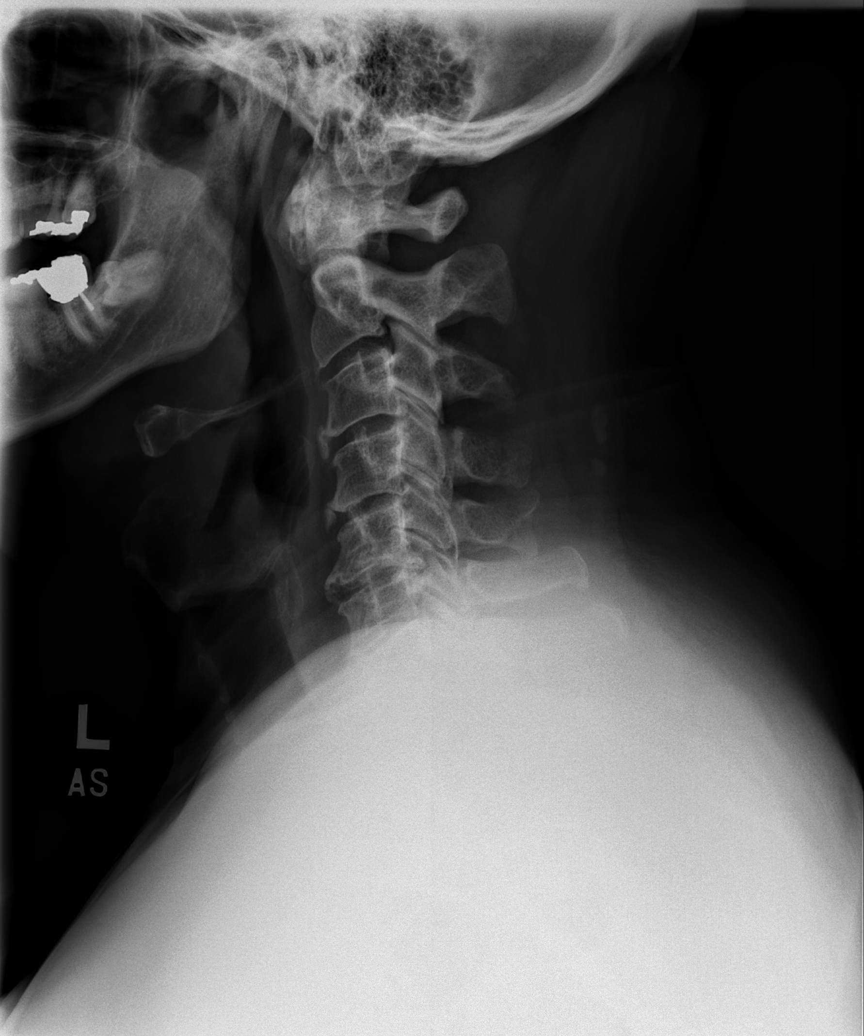

[w c-spine oblique (1 of 2)]
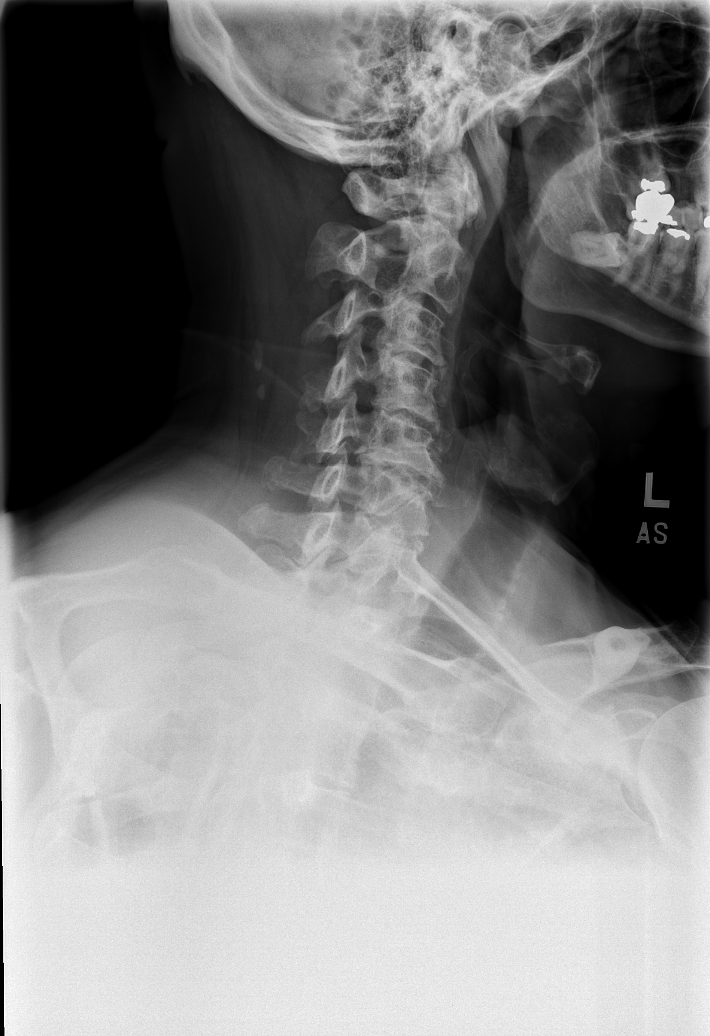

[w c-spine oblique (2 of 2)]
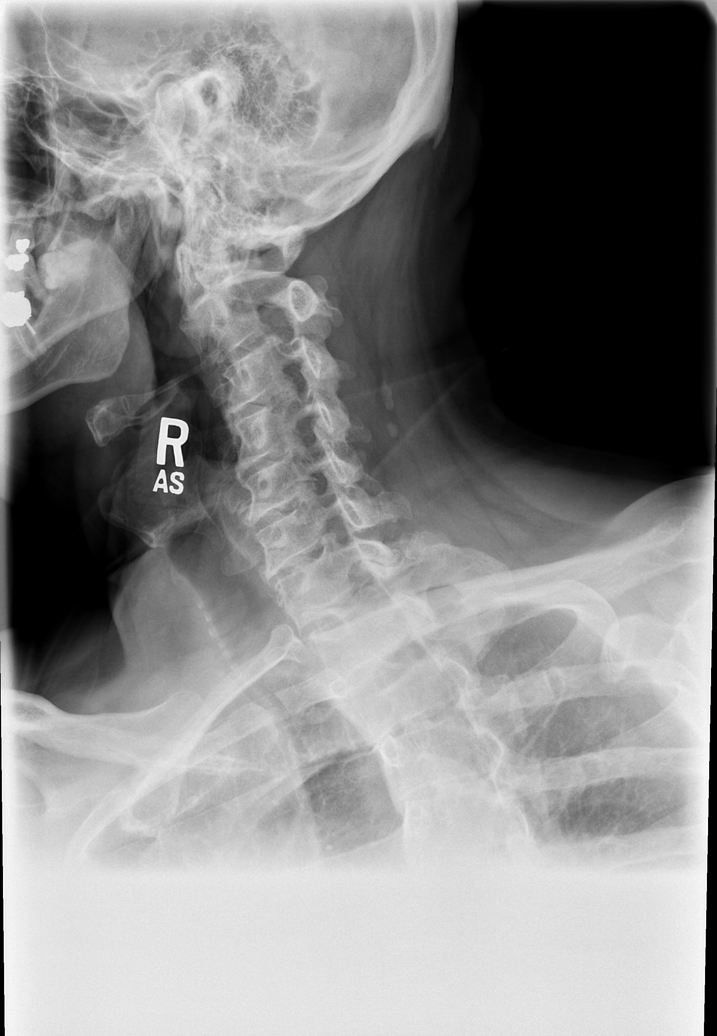

[w c-spine a.p. *]
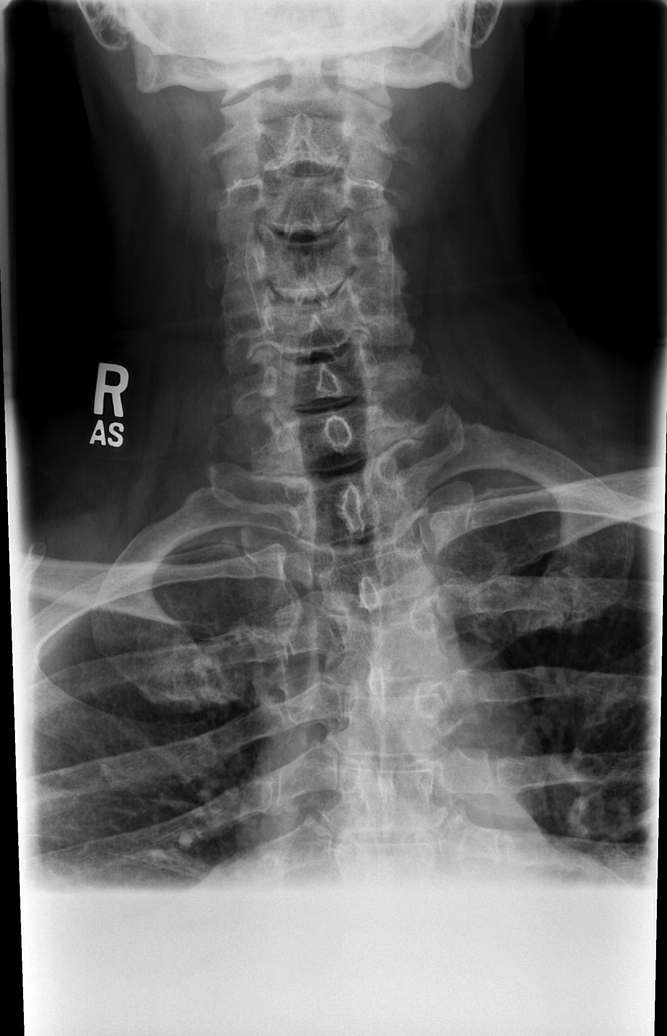

[w c-spine odontoid *]
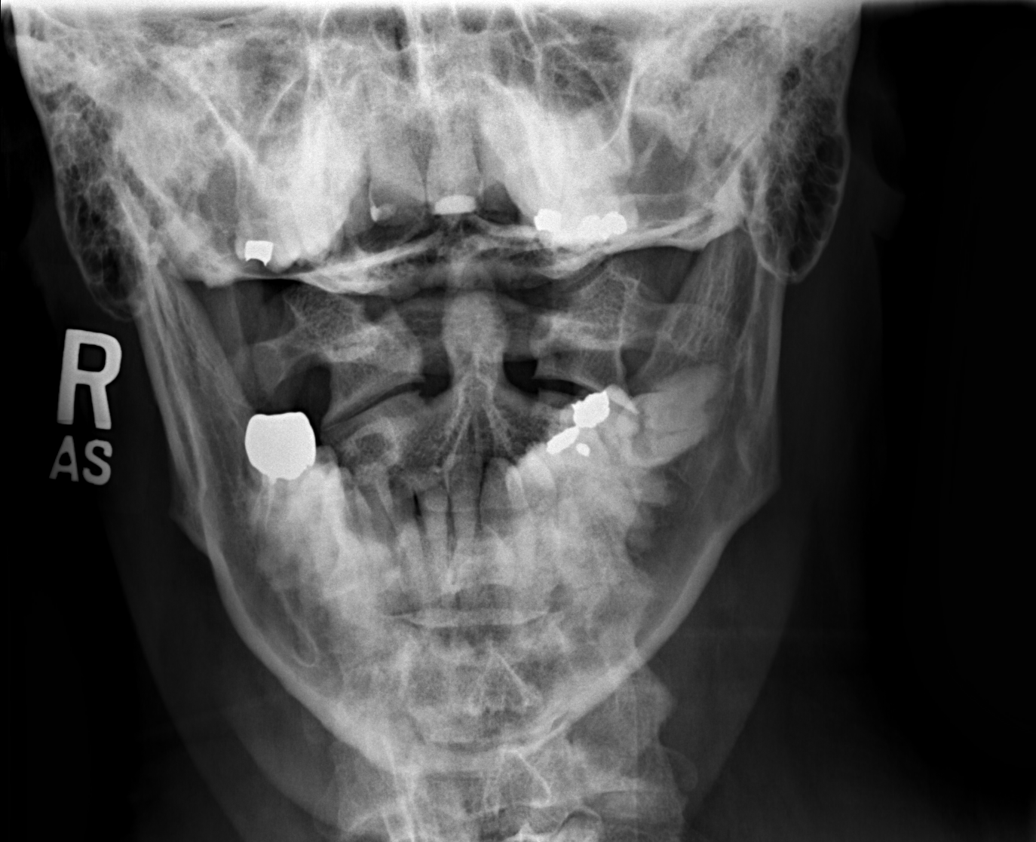

[w swimmers view *]
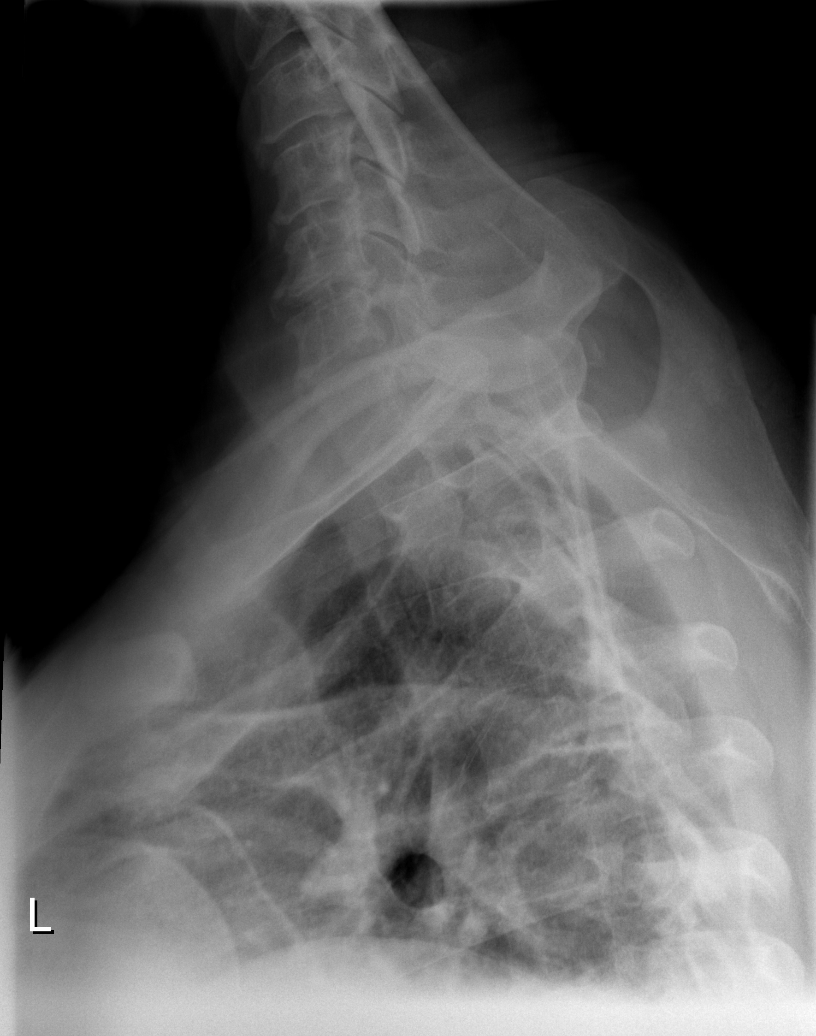

[6 of 6 positions shown; findings below may reference images not displayed]

FINDINGS: The cervical vertebral bodies are preserved in height. There is
minimal disc space narrowing at multiple levels with small anterior
endplate osteophytes. There is no spondylolisthesis. The oblique
views reveal mild multilevel neural foraminal encroachment
bilaterally due to facet joint and uncovertebral joint osteophytes.
The C1-C2 articulation is normal. The prevertebral soft tissue
spaces are normal.
IMPRESSION: There is mild multilevel degenerative disc change with multilevel
bony neural foraminal encroachment. MRI of the cervical spine is
recommended given the patient's radicular symptoms.

## 2015-01-11 NOTE — Progress Notes (Signed)
Patient ID: Ian Leblanc MRN: [id number], DOB: Sep 10, 1960, 54 y.o. Date of Encounter: 01/11/2015, 11:57 AM    Chief Complaint:  Chief Complaint  Patient presents with  . shoulder pain    3 week     HPI: 54 y.o. year old white male is here for follow-up regarding discomfort in his left scapula left neck neck and now down his left arm and hand.    THE FOLLOWING IS COPIED FROM HIS OV NOTE 12/23/2014:  says that he has played golf some. Says that while he is playing golf, he has no pain. Says that this past Saturday he was having to pull on large wrenches with a lot of force. Says that yesterday he was having to work on his lawnmower. Says he is not sure if some of these activities have been the cause of these symptoms. Says that he is having discomfort in the left scapula area. Seems to be radiating down towards the left elbow. Says that he can move the left shoulder in all directions with no sharp pain. However when he is just sitting such as doing work at his desk just feels like the whole left shoulder area is heavy and weighted down and tight.  Says I had prescribed muscle relaxer Skelaxin for a "crick in the neck in the past. Just got that refilled and that seems to help some and is taking this 3 times a day. He is also taking Advil at the maximum dose recommended on the bottle. Says that he is applying heat and that does seem to help. Says he mostly notices just feeling uncomfortable especially when he walks in the morning just that whole left shoulder area feels tight and pulled down and also feels this way when he is sitting in his chair at work. 1. Left shoulder pain Discussed applying heat to the area as much as possible. Also apply very warm water to that area when in the shower. Stretch the area as much as possible. i demonstrated different stretches for the area. Continue taking Skelaxin 3 times a day. continue Advil. Will add tramadol to ease the discomfort that he is  experiencing. If symptoms worsen or are not resolving over the next 1-2 weeks, then follow-up. - traMADol (ULTRAM) 50 MG tablet; Take 1 tablet (50 mg total) by mouth every 8 (eight) hours as needed.  Dispense: 60 tablet; Refill: 0   TODAY--01/11/2015: Today he states that his wife has told him that when she looks at his back, his left scapula seems to be protruding out further than the right scapula. Today he states that he is now having tingly sensation down his left arm and into his fingers of the left hand. Says that the fingers of the left hand feel numb. Has had no weakness of the arm or hand. Says that he has stopped using the tramadol because it was not helpful. Says he is taking the Skelaxin 3 times a day but really isn't seeing much change in symptoms with this. Says that he can still do full range of motion with the left shoulder and when doing this, this causes no pain and no problems. Says that he still isn't having any real true pain. Says that as stated at the last visit,  it is more just a nagging discomfort--  but now he is also having symptoms of paresthesias.     Home Meds:   Outpatient Prescriptions Prior to Visit  Medication Sig Dispense Refill  . aspirin 81  MG tablet Take 81 mg by mouth daily.    . benazepril (LOTENSIN) 20 MG tablet TAKE ONE TABLET BY MOUTH DAILY 90 tablet 0  . hydrochlorothiazide (MICROZIDE) 12.5 MG capsule Take 1 capsule (12.5 mg total) by mouth daily. 90 capsule 1  . metaxalone (SKELAXIN) 800 MG tablet TAKE ONE TABLET BY MOUTH THREE TIMES DAILY 30 tablet 0  . metFORMIN (GLUCOPHAGE) 500 MG tablet Take 1 tablet (500 mg total) by mouth 2 (two) times daily with a meal. 180 tablet 1  . Multiple Vitamins-Minerals (MULTIVITAMIN PO) Take 1 tablet by mouth daily. Over 50 mvi qd    . Omega-3 Fatty Acids (OMEGA-3 FISH OIL PO) Take 600 mg by mouth daily.     . simvastatin (ZOCOR) 80 MG tablet Take 1 tablet (80 mg total) by mouth daily. 90 tablet 1  . traMADol  (ULTRAM) 50 MG tablet Take 1 tablet (50 mg total) by mouth every 8 (eight) hours as needed. (Patient not taking: Reported on 01/11/2015) 60 tablet 0  . nitroGLYCERIN (NITROSTAT) 0.4 MG SL tablet Place 1 tablet (0.4 mg total) under the tongue every 5 (five) minutes as needed for chest pain. 25 tablet 3   No facility-administered medications prior to visit.    Allergies:  Allergies  Allergen Reactions  . Crestor [Rosuvastatin] Other (See Comments)    Severe myalgias      Review of Systems: See HPI for pertinent ROS. All other ROS negative.    Physical Exam: Blood pressure 132/80, pulse 72, temperature 98.2 F (36.8 C), temperature source Oral, resp. rate 19, weight 238 lb (107.956 kg)., Body mass index is 34.15 kg/(m^2). General:  WNWD WM. Appears in no acute distress. Neck: Supple. No thyromegaly. No lymphadenopathy. Lungs: Clear bilaterally to auscultation without wheezes, rales, or rhonchi. Breathing is unlabored. Heart: Regular rhythm. No murmurs, rubs, or gallops. Msk:  Strength and tone normal for age. There is some tenderness with palpation of the left scapula area and left neck. Range of motion of the left shoulder is normal. Left elbow--inspection is normal. Palpation of the medial epicondyles and lateral epicondyles are normal with no significant tenderness. 5/5 strength of arm.  5/5 grip strength Extremities/Skin: Warm and dry. Neuro: Alert and oriented X 3. Moves all extremities spontaneously. Gait is normal. CNII-XII grossly in tact. Psych:  Responds to questions appropriately with a normal affect.     ASSESSMENT AND PLAN:  54 y.o. year old male with   1. Paresthesia of left arm - DG Cervical Spine Complete; Future  2. Neck pain on left side - DG Cervical Spine Complete; Future  Obtain cervical spine x-ray. Follow-up with him with further instructions once I get this result. Otherwise, for now, continue the Skelaxin 3 times a day. Continue heat and gentle  stretch of the neck and shoulder region.  Signed, [address] [place], [place], [facility] 01/11/2015 11:57 AM

## 2015-01-13 ENCOUNTER — Other Ambulatory Visit: Payer: Self-pay | Admitting: Physician Assistant

## 2015-01-14 NOTE — Telephone Encounter (Signed)
Medication refilled per protocol. 

## 2015-01-26 ENCOUNTER — Other Ambulatory Visit: Payer: Self-pay | Admitting: Physician Assistant

## 2015-01-27 NOTE — Telephone Encounter (Signed)
Ok to refill 

## 2015-01-27 NOTE — Telephone Encounter (Signed)
Approved # [id number] 

## 2015-01-27 NOTE — Telephone Encounter (Signed)
Script sent to pharmacy.

## 2015-02-04 ENCOUNTER — Ambulatory Visit (INDEPENDENT_AMBULATORY_CARE_PROVIDER_SITE_OTHER): Payer: Managed Care, Other (non HMO) | Admitting: Physician Assistant

## 2015-02-04 ENCOUNTER — Encounter: Payer: Self-pay | Admitting: Physician Assistant

## 2015-02-04 VITALS — BP 122/88 | HR 77 | Temp 98.7°F | Resp 18

## 2015-02-04 DIAGNOSIS — M489 Spondylopathy, unspecified: Secondary | ICD-10-CM | POA: Insufficient documentation

## 2015-02-04 DIAGNOSIS — M542 Cervicalgia: Secondary | ICD-10-CM | POA: Insufficient documentation

## 2015-02-04 DIAGNOSIS — R202 Paresthesia of skin: Secondary | ICD-10-CM | POA: Diagnosis not present

## 2015-02-04 DIAGNOSIS — R200 Anesthesia of skin: Secondary | ICD-10-CM | POA: Insufficient documentation

## 2015-02-04 DIAGNOSIS — M438X2 Other specified deforming dorsopathies, cervical region: Secondary | ICD-10-CM | POA: Diagnosis not present

## 2015-02-04 HISTORY — DX: Cervicalgia: M54.2

## 2015-02-04 HISTORY — DX: Paresthesia of skin: R20.2

## 2015-02-04 HISTORY — DX: Anesthesia of skin: R20.0

## 2015-02-04 HISTORY — DX: Spondylopathy, unspecified: M48.9

## 2015-02-04 MED ORDER — PREDNISONE 20 MG PO TABS: ORAL_TABLET | ORAL | Status: DC

## 2015-02-04 NOTE — Progress Notes (Addendum)
Patient ID: Ian Leblanc MRN: [id number], DOB: 10-Sep-1960, 54 y.o. Date of Encounter: 02/04/2015, 2:45 PM    Chief Complaint:  Chief Complaint  Patient presents with  . left arm discomfort     HPI: 54 y.o. year old white male is here for follow-up regarding discomfort in his left scapula left neck neck and now down his left arm and hand.    THE FOLLOWING IS COPIED FROM HIS OV NOTE 12/23/2014:  says that he has played golf some. Says that while he is playing golf, he has no pain. Says that this past Saturday he was having to pull on large wrenches with a lot of force. Says that yesterday he was having to work on his lawnmower. Says he is not sure if some of these activities have been the cause of these symptoms. Says that he is having discomfort in the left scapula area. Seems to be radiating down towards the left elbow. Says that he can move the left shoulder in all directions with no sharp pain. However when he is just sitting such as doing work at his desk just feels like the whole left shoulder area is heavy and weighted down and tight.  Says I had prescribed muscle relaxer Skelaxin for a "crick in the neck in the past. Just got that refilled and that seems to help some and is taking this 3 times a day. He is also taking Advil at the maximum dose recommended on the bottle. Says that he is applying heat and that does seem to help. Says he mostly notices just feeling uncomfortable especially when he walks in the morning just that whole left shoulder area feels tight and pulled down and also feels this way when he is sitting in his chair at work. 1. Left shoulder pain Discussed applying heat to the area as much as possible. Also apply very warm water to that area when in the shower. Stretch the area as much as possible. i demonstrated different stretches for the area. Continue taking Skelaxin 3 times a day. continue Advil. Will add tramadol to ease the discomfort that he is  experiencing. If symptoms worsen or are not resolving over the next 1-2 weeks, then follow-up. - traMADol (ULTRAM) 50 MG tablet; Take 1 tablet (50 mg total) by mouth every 8 (eight) hours as needed.  Dispense: 60 tablet; Refill: 0   OV--01/11/2015: Today he states that his wife has told him that when she looks at his back, his left scapula seems to be protruding out further than the right scapula. Today he states that he is now having tingly sensation down his left arm and into his fingers of the left hand. Says that the fingers of the left hand feel numb. Has had no weakness of the arm or hand. Says that he has stopped using the tramadol because it was not helpful. Says he is taking the Skelaxin 3 times a day but really isn't seeing much change in symptoms with this. Says that he can still do full range of motion with the left shoulder and when doing this, this causes no pain and no problems. Says that he still isn't having any real true pain. Says that as stated at the last visit,  it is more just a nagging discomfort--  but now he is also having symptoms of paresthesias.   OV 02/04/2015: He reports that he is still having the symptoms same symptoms as he was having at his last visit. Says that he  has learned to avoid doing any type of pulling motion with his arms. Says that he can push but if he does any pulling this really aggravates his neck and his symptoms are much worse. He has just been very careful about what he does and has avoided these kinds of movements. Has also been avoiding playing any golf. States that he continues to have no significant true pain but says that he still has paresthesias in the left arm and a numb sensation in the fingers of the left hand at times. Still has noticed no weakness in the left arm or the left hand grip.  Home Meds:   Outpatient Prescriptions Prior to Visit  Medication Sig Dispense Refill  . aspirin 81 MG tablet Take 81 mg by mouth daily.    .  benazepril (LOTENSIN) 20 MG tablet TAKE ONE TABLET BY MOUTH DAILY 90 tablet 0  . hydrochlorothiazide (MICROZIDE) 12.5 MG capsule Take 1 capsule (12.5 mg total) by mouth daily. 90 capsule 1  . metaxalone (SKELAXIN) 800 MG tablet TAKE ONE TABLET BY MOUTH THREE TIMES DAILY 30 tablet 0  . metFORMIN (GLUCOPHAGE) 500 MG tablet Take 1 tablet (500 mg total) by mouth 2 (two) times daily with a meal. 180 tablet 1  . Multiple Vitamins-Minerals (MULTIVITAMIN PO) Take 1 tablet by mouth daily. Over 50 mvi qd    . Omega-3 Fatty Acids (OMEGA-3 FISH OIL PO) Take 600 mg by mouth daily.     . simvastatin (ZOCOR) 80 MG tablet Take 1 tablet (80 mg total) by mouth daily. 90 tablet 1  . traMADol (ULTRAM) 50 MG tablet Take 1 tablet (50 mg total) by mouth every 8 (eight) hours as needed. (Patient not taking: Reported on 01/11/2015) 60 tablet 0   No facility-administered medications prior to visit.    Allergies:  Allergies  Allergen Reactions  . Crestor [Rosuvastatin] Other (See Comments)    Severe myalgias      Review of Systems: See HPI for pertinent ROS. All other ROS negative.    Physical Exam: Blood pressure 122/88, pulse 77, temperature 98.7 F (37.1 C), resp. rate 18., There is no weight on file to calculate BMI. General:  WNWD WM. Appears in no acute distress. Neck: Supple. No thyromegaly. No lymphadenopathy. Lungs: Clear bilaterally to auscultation without wheezes, rales, or rhonchi. Breathing is unlabored. Heart: Regular rhythm. No murmurs, rubs, or gallops. Msk:  Strength and tone normal for age. There is some tenderness with palpation of the left scapula area and left neck. Range of motion of the left shoulder is normal. Left elbow--inspection is normal. Palpation of the medial epicondyles and lateral epicondyles are normal with no significant tenderness. 5/5 strength of arm.  5/5 grip strength Extremities/Skin: Warm and dry. Neuro: Alert and oriented X 3. Moves all extremities spontaneously.  Gait is normal. CNII-XII grossly in tact. Psych:  Responds to questions appropriately with a normal affect.     ASSESSMENT AND PLAN:  54 y.o. year old male with 1. Cervical spine disease - MR Cervical Spine Wo Contrast; Future - predniSONE (DELTASONE) 20 MG tablet; Take 3 daily for 2 days, then 2 daily for 2 days, then 1 daily for 2 days.  Dispense: 12 tablet; Refill: 0 - Ambulatory referral to Neurosurgery  2. Neck pain on left side - MR Cervical Spine Wo Contrast; Future - predniSONE (DELTASONE) 20 MG tablet; Take 3 daily for 2 days, then 2 daily for 2 days, then 1 daily for 2 days.  Dispense: 12 tablet;  Refill: 0 - Ambulatory referral to Neurosurgery  3. Paresthesia of left arm - MR Cervical Spine Wo Contrast; Future - predniSONE (DELTASONE) 20 MG tablet; Take 3 daily for 2 days, then 2 daily for 2 days, then 1 daily for 2 days.  Dispense: 12 tablet; Refill: 0 - Ambulatory referral to Neurosurgery  4. Numbness of fingers - MR Cervical Spine Wo Contrast; Future - predniSONE (DELTASONE) 20 MG tablet; Take 3 daily for 2 days, then 2 daily for 2 days, then 1 daily for 2 days.  Dispense: 12 tablet; Refill: 0 - Ambulatory referral to Neurosurgery   Cervical spine x-ray was performed 01/11/15. This revealed mild multilevel degenerative disc change with multilevel bony neural foraminal encroachment. MRI of the cervical spine was recommended. When I obtained that result, I placed order for MRI cervical spine. However, this was denied by insurance until patient had 6 week follow-up.  At this time,I have told him to go ahead and start the prednisone taper as directed. Explained to him that this will cause his blood sugar to increase and not to be alarmed by this. Also discussed the need to be very strict about his carbohydrate intake especially during these first several days when he is on the higher dose of prednisone.  Will obtain MRI and will obtain follow-up with Dr. Earle Gell. Pt  states that he knows Dr. Earle Gell personally so I will arrange follow-up with him.  In addition, can continue the Skelaxin 3 times a day.   I received copy of office note by Dr. Earle Gell with [facility]. His note was dated 02/16/2015. That note has been sent to scan into Epic.  His note says that cervical spine MRI was scheduled but patient did not follow through with this. He was concerned he may have some metal in his eyes from his work. He did review patient's cervical spine series performed at [place] 01/11/15. He had mildly kyphotic cervical spine. He had spondylosis most prominent at C5-C6 with bilateral neural foraminal stenosis. Ventral spondylosis at multiple levels. What I can tell from his note, no prescriptions were ordered at that office visit.  His note stated that he recommended a get a cervical spine MRI but patient wanted to hold off for a few months for insurance reasons. He plan to see patient back for follow-up in a few months.. What I can tell from his note no prescriptions were ordered at that office visit.  [address] [place], [place], [facility] 02/04/2015 2:45 PM

## 2015-02-05 ENCOUNTER — Other Ambulatory Visit: Payer: Self-pay | Admitting: Physician Assistant

## 2015-02-05 DIAGNOSIS — M542 Cervicalgia: Secondary | ICD-10-CM

## 2015-02-05 DIAGNOSIS — R200 Anesthesia of skin: Secondary | ICD-10-CM

## 2015-02-05 DIAGNOSIS — R202 Paresthesia of skin: Secondary | ICD-10-CM

## 2015-02-05 DIAGNOSIS — M489 Spondylopathy, unspecified: Secondary | ICD-10-CM

## 2015-03-01 ENCOUNTER — Other Ambulatory Visit: Payer: Self-pay | Admitting: Physician Assistant

## 2015-03-01 NOTE — Telephone Encounter (Signed)
Medication refilled per protocol. 

## 2015-03-11 ENCOUNTER — Encounter: Payer: Self-pay | Admitting: Physician Assistant

## 2015-03-11 ENCOUNTER — Ambulatory Visit (INDEPENDENT_AMBULATORY_CARE_PROVIDER_SITE_OTHER): Payer: Managed Care, Other (non HMO) | Admitting: Physician Assistant

## 2015-03-11 VITALS — BP 130/82 | HR 68 | Temp 97.6°F | Resp 18 | Wt 241.0 lb

## 2015-03-11 DIAGNOSIS — R200 Anesthesia of skin: Secondary | ICD-10-CM

## 2015-03-11 DIAGNOSIS — Z125 Encounter for screening for malignant neoplasm of prostate: Secondary | ICD-10-CM

## 2015-03-11 DIAGNOSIS — M438X2 Other specified deforming dorsopathies, cervical region: Secondary | ICD-10-CM

## 2015-03-11 DIAGNOSIS — R739 Hyperglycemia, unspecified: Secondary | ICD-10-CM

## 2015-03-11 DIAGNOSIS — M542 Cervicalgia: Secondary | ICD-10-CM

## 2015-03-11 DIAGNOSIS — Z86010 Personal history of colonic polyps: Secondary | ICD-10-CM

## 2015-03-11 DIAGNOSIS — I10 Essential (primary) hypertension: Secondary | ICD-10-CM

## 2015-03-11 DIAGNOSIS — E785 Hyperlipidemia, unspecified: Secondary | ICD-10-CM

## 2015-03-11 DIAGNOSIS — M489 Spondylopathy, unspecified: Secondary | ICD-10-CM

## 2015-03-11 DIAGNOSIS — R202 Paresthesia of skin: Secondary | ICD-10-CM | POA: Diagnosis not present

## 2015-03-11 DIAGNOSIS — E119 Type 2 diabetes mellitus without complications: Secondary | ICD-10-CM

## 2015-03-11 LAB — LIPID PANEL
Cholesterol: 155 mg/dL (ref 0–200)
HDL: 35 mg/dL — ABNORMAL LOW (ref >=40)
LDL Cholesterol: 90 mg/dL (ref 0–99)
Total CHOL/HDL Ratio: 4.4 Ratio
Triglycerides: 148 mg/dL (ref <150)
VLDL: 30 mg/dL (ref 0–40)

## 2015-03-11 LAB — HEPATIC FUNCTION PANEL
ALT: 59 U/L — ABNORMAL HIGH (ref 0–53)
AST: 32 U/L (ref 0–37)
Albumin: 4.1 g/dL (ref 3.5–5.2)
Alkaline Phosphatase: 53 U/L (ref 39–117)
Bilirubin, Direct: 0.1 mg/dL (ref 0.0–0.3)
Indirect Bilirubin: 0.4 mg/dL (ref 0.2–1.2)
Total Bilirubin: 0.5 mg/dL (ref 0.2–1.2)
Total Protein: 6.5 g/dL (ref 6.0–8.3)

## 2015-03-11 NOTE — Progress Notes (Signed)
Patient ID: Ian Leblanc MRN: [id number], DOB: 1960/09/08, 54 y.o. Date of Encounter: @DATE @  Chief Complaint:  Chief Complaint  Patient presents with  . Follow-up    3 mos f/u    HPI: 54 y.o. year old white male  presents for routine followup office visit.  Also, he had 2 office visits with me in May 2016 regarding pain in his left shoulder and pain in his left neck. XRay cervical spine did show significant findings. Patient stated that he knew Dr. Earle Gell personally and wanted to see him for follow-up. Patient tells me that his wife has taken care of Dr. Arnoldo Morale children and takes care of his pets when they are out of town.  Patient knows Dr. Arnoldo Morale and his mother who now lives with Dr. Arnoldo Morale.  He says that he has a great man. Says that his children are still not driving age and thinks that Dr. Arnoldo Morale is in his 31s. Says that because of his insurance deductible they decided to wait to do his MRI once the new "calendar year "starts regarding his insurance deductible. Says this restarts in October.  Says that way if he needs surgery, the MRI and the surgery would all be done within one calendar year and he would just pay the deductible that one time. He states that the prednisone I gave helped A LOT. Says that the paresthesias resolved completely. Says that he has just recently started to get a little bit of tingling into his fingers again but that it is very minimal. Today I discussed with him prednisone and the fact that you do not want to use it more frequently then absolutely necessary but at the same time if it is needed for him to call me and we can prescribe another round of this to hold him over until October.    At his  office visit with me on 03/05/13 he had lost 13 pounds since his prior visit in April. He reported that he was walking every morning for 25 minutes. He was also doing a lot of work on his house.  Also he made a lot of diet changes. For breakfast he was  eating  bar. For lunch he eats with his mother and was eating vegetables which were cooked in a healthy manner. For dinner his wife is following a carbohydrate handout in regards to what she was cooking. Prior to that he had been eating a lot at dinnertime and eating late. He has started eating earlier and decreasing his portions. As well he decrease bread intake a lot. At visit 05/2013 his weight was only up 2 pounds compared to her was in July he is maintain this pretty well.  At [facility] 03/09/14 he reports he has lost 9 lbs since LOV here 4/15--says the only thing he has changed is that he has stopped eating at night. Says that per his dinner meal he has tried to decrease his portions. Says he was raised that you eat whatever is on your plate. Therefore he is recently decreased the amount that he is putting on his plate !!  Also, not eating any other snacks after dinner.  At his  office visit 4/15 labs revealed A1c 7.2. LDL 126. At that time recommended starting metformin 500 mg twice a day. Also to increase simvastatin from 20 mg to 40 mg. At f/u 02/2014 he reports that he made both of these changes. Says that he never had any diarrhea or other GI adverse effects  with the metformin. He has noticed no symptoms with taking the metformin and the increase simvastatin.  At [facility] 11/2014--says that he has lost 6 pounds since his last visit. Make sure he walks some each day. Also is playing golf 9 holes 2 days a week and walks this 9 holes. All tumor his grafts.  He has a grandson who lives nearby. He is 56 years old. He sees him regularly.  His son who lives in [place]  had another baby --born 05/2014.  He also has a 110-year-old grandchild who lives in [place]. They usually come every August. They did come again this August 2015. However, at the visit in April 2016 patient states that this year they are not coming  during the summer. Instead they are going to come Christmas. This will be the first time in 6 years ago  he has been for Christmas so patient's wife is very excited.   In past, has told me  that his son is a Radio producer and his wife can easily take off a high amount of work. Says that when they come here to visit they are able to come and stay a whole month.   06/2013 he went to the emergency room with episode of chest pain.  had a myocardial perfusion scan on 07/23/13 which was normal. 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 Says that the cardiologist told him he had inhaled mold from the leaves and about was the cause of his symptoms.     Past Medical History  Diagnosis Date  . Hypertension   . Hyperlipidemia   . Sessile colonic polyp   . Hyperglycemia      Home Meds:  Outpatient Prescriptions Prior to Visit  Medication Sig Dispense Refill  . aspirin 81 MG tablet Take 81 mg by mouth daily.    . benazepril (LOTENSIN) 20 MG tablet TAKE ONE TABLET BY MOUTH DAILY 90 tablet 0  . hydrochlorothiazide (MICROZIDE) 12.5 MG capsule TAKE ONE CAPSULE BY MOUTH DAILY 90 capsule 1  . metaxalone (SKELAXIN) 800 MG tablet TAKE ONE TABLET BY MOUTH THREE TIMES DAILY 30 tablet 0  . metFORMIN (GLUCOPHAGE) 500 MG tablet Take 1 tablet (500 mg total) by mouth 2 (two) times daily with a meal. 180 tablet 1  . Multiple Vitamins-Minerals (MULTIVITAMIN PO) Take 1 tablet by mouth daily. Over 50 mvi qd    . Omega-3 Fatty Acids (OMEGA-3 FISH OIL PO) Take 600 mg by mouth daily.     . predniSONE (DELTASONE) 20 MG tablet Take 3 daily for 2 days, then 2 daily for 2 days, then 1 daily for 2 days. 12 tablet 0  . simvastatin (ZOCOR) 80 MG tablet Take 1 tablet (80 mg total) by mouth daily. 90 tablet 1  . traMADol (ULTRAM) 50 MG tablet Take 1 tablet (50 mg total) by mouth every 8 (eight) hours as needed. 60 tablet 0   No facility-administered medications prior to visit.     Allergies:   Allergies  Allergen Reactions  . Crestor [Rosuvastatin] Other (See Comments)    Severe myalgias    History   Social History  . Marital Status: Married    Spouse Name: N/A  . Number of Children: N/A  . Years of Education: N/A   Occupational History  . Not on file.   Social History Main Topics  .  Smoking status: Former Smoker    Quit date: 07/17/2003  . Smokeless tobacco: Never Used  . Alcohol Use: No  . Drug Use: No  . Sexual Activity: Not on file   Other Topics Concern  . Not on file   Social History Narrative    History reviewed. No pertinent family history.   Review of Systems:  See HPI for pertinent ROS. All other ROS negative.    Physical Exam: Blood pressure 130/82, pulse 68, temperature 97.6 F (36.4 C), temperature source Oral, resp. rate 18, weight 241 lb (109.317 kg)., Body mass index is 34.58 kg/(m^2). General: WNWD WM. Appears in no acute distress. Neck: Supple. No thyromegaly. No lymphadenopathy. No Carotid bruits Lungs: Clear bilaterally to auscultation without wheezes, rales, or rhonchi. Breathing is unlabored. Heart: RRR with S1 S2. No murmurs, rubs, or gallops. Abdomen: Soft, non-tender, non-distended with normoactive bowel sounds. No hepatomegaly. No rebound/guarding. No obvious abdominal masses. Musculoskeletal:  Strength and tone normal for age. Extremities/Skin: Warm and dry.  No edema.  Neuro: Alert and oriented X 3. Moves all extremities spontaneously. Gait is normal. CNII-XII grossly in tact. Psych:  Responds to questions appropriately with a normal affect.     ASSESSMENT AND PLAN:  54 y.o. year old male with  1. HTN (hypertension) His blood pressure  Is well controlled. Cont curent medications.  2. HLD (hyperlipidemia) 12/09/2014---  increased simvastatin from 40 mg to 80 mg. At visit 03/11/2015 he states that he did make this change. He is fasting today so we'll recheck FLP/LFT today.  3. DIABETES 11/2013 started Metformin.  Also  weight down 9 pounds compared to 4/15 secondary to diet changes at night. 02/2014 A1C went down to 6.1 (from 7.2-- 11/2013) - Hemoglobin A1c -Micro Albumin---12/09/2014  Gave Pneumovax 23 02/2014.  He is on aspirin 81 mg daily He is on ACE inhibitor He is on statin therapy  Office visit 12/09/2014 discussed diabetic eye exam. Wears glasses so he has an annual on exam. Next exam is due in August. Told him to make sure to inform them that he has diabetes so that they can check for any diabetic changes.   Cervical spine disease Also, he had 2 office visits with me in May 2016 regarding pain in his left shoulder and pain in his left neck. XRay cervical spine did show significant findings. Patient stated that he knew Dr. Earle Gell personally and wanted to see him for follow-up. Patient tells me that his wife has taken care of Dr. Arnoldo Morale children and takes care of his pets when they are out of town.  Patient knows Dr. Arnoldo Morale and his mother who now lives with Dr. Arnoldo Morale.  He says that he has a great man. Says that his children are still not driving age and thinks that Dr. Arnoldo Morale is in his 36s. Says that because of his insurance deductible they decided to wait to do his MRI once the new "calendar year "starts regarding his insurance deductible. Says this restarts in October.  Says that way if he needs surgery, the MRI and the surgery would all be done within one calendar year and he would just pay the deductible that one time. He states that the prednisone I gave helped A LOT. Says that the paresthesias resolved completely. Says that he has just recently started to get a little bit of tingling into his fingers again but that it is very minimal. Today I discussed with him prednisone and the fact that you do not want  to use it more frequently then absolutely necessary but at the same time if it is needed for him to call me and we can prescribe another round of this to hold him over until  October.      4. Screening for prostate cancer Last PSA was 06/05/2013 Recheck now--12/09/2014  5. Personal history of colonic polyps Colonoscopy 06/24/09 with polypectomy. Repeat 3-5 years. He did have repeat colonoscopy between April 2015 - October 2015 . Patient states that this showed a very small polyp. Was told to repeat 5 years.  6. Immunizations: Influenza Vaccine--states that he received this at [facility]--Fall 2015 T. dap 03/2011. Pneumovax 23--Gave here-- 03/09/2014             No Other pneumonia vaccine indicated until age 46 Zostavax--not indicated until age 26. Discuss then.  At office visit 12/09/14 I discussed with patient that if labs remain stable he could wait 6 months for visit. He prefers to be seen every 3 months to closely monitor things. Routine Office visit 3 months. F/U  sooner if needed.   Signed, [address] [place], [place], [facility] 03/11/2015 8:16 AM

## 2015-03-12 LAB — HEMOGLOBIN A1C
Hgb A1c MFr Bld: 7 % — ABNORMAL HIGH (ref <5.7)
Mean Plasma Glucose: 154 mg/dL — ABNORMAL HIGH (ref <117)

## 2015-03-24 ENCOUNTER — Telehealth: Payer: Self-pay | Admitting: Family Medicine

## 2015-03-24 MED ORDER — BENAZEPRIL HCL 20 MG PO TABS: 20.0000 mg | ORAL_TABLET | Freq: Every day | ORAL | Status: DC

## 2015-03-24 MED ORDER — SIMVASTATIN 80 MG PO TABS: 80.0000 mg | ORAL_TABLET | Freq: Every day | ORAL | Status: DC

## 2015-03-24 MED ORDER — HYDROCHLOROTHIAZIDE 12.5 MG PO CAPS: 12.5000 mg | ORAL_CAPSULE | Freq: Every day | ORAL | Status: DC

## 2015-03-24 MED ORDER — METFORMIN HCL 1000 MG PO TABS: 1000.0000 mg | ORAL_TABLET | Freq: Two times a day (BID) | ORAL | Status: DC

## 2015-03-24 NOTE — Telephone Encounter (Signed)
-----   Message from Orlena Sheldon, PA-C sent at 03/15/2015  8:04 AM EDT ----- Cholesterol much better since increasing simvastatin to 80 mg. Blood sugar is a little high. Increase metformin to 1000 mg twice a day #60+5 ( remove old dose from med list)

## 2015-03-24 NOTE — Telephone Encounter (Signed)
Finally able to reach patient.  Admitted he has just not called me back.  Advised to continue Simvastatin.  Need to increase Metformin to 1000 mg BID.  Can take 2-500 to use up supply at home and will send new RX to pharmacy.  He ask I refill all his medications.  Reminded him about appt in October.

## 2015-05-10 ENCOUNTER — Ambulatory Visit: Payer: Managed Care, Other (non HMO) | Admitting: Physician Assistant

## 2015-06-07 ENCOUNTER — Ambulatory Visit
Admission: RE | Admit: 2015-06-07 | Discharge: 2015-06-07 | Disposition: A | Discharge status: Home / Self Care | Payer: 59 | Source: Ambulatory Visit | Attending: Physician Assistant | Admitting: Physician Assistant

## 2015-06-07 DIAGNOSIS — R202 Paresthesia of skin: Secondary | ICD-10-CM

## 2015-06-07 DIAGNOSIS — M489 Spondylopathy, unspecified: Secondary | ICD-10-CM

## 2015-06-07 DIAGNOSIS — M542 Cervicalgia: Secondary | ICD-10-CM

## 2015-06-07 DIAGNOSIS — R200 Anesthesia of skin: Secondary | ICD-10-CM

## 2015-06-14 ENCOUNTER — Ambulatory Visit (INDEPENDENT_AMBULATORY_CARE_PROVIDER_SITE_OTHER): Payer: 59 | Admitting: Physician Assistant

## 2015-06-14 ENCOUNTER — Encounter: Payer: Self-pay | Admitting: Physician Assistant

## 2015-06-14 VITALS — BP 110/70 | HR 64 | Temp 98.2°F | Resp 18 | Wt 237.0 lb

## 2015-06-14 DIAGNOSIS — M489 Spondylopathy, unspecified: Secondary | ICD-10-CM

## 2015-06-14 DIAGNOSIS — E785 Hyperlipidemia, unspecified: Secondary | ICD-10-CM

## 2015-06-14 DIAGNOSIS — E1165 Type 2 diabetes mellitus with hyperglycemia: Secondary | ICD-10-CM

## 2015-06-14 DIAGNOSIS — M438X2 Other specified deforming dorsopathies, cervical region: Secondary | ICD-10-CM

## 2015-06-14 DIAGNOSIS — Z125 Encounter for screening for malignant neoplasm of prostate: Secondary | ICD-10-CM

## 2015-06-14 DIAGNOSIS — I10 Essential (primary) hypertension: Secondary | ICD-10-CM

## 2015-06-14 DIAGNOSIS — R200 Anesthesia of skin: Secondary | ICD-10-CM | POA: Diagnosis not present

## 2015-06-14 DIAGNOSIS — R202 Paresthesia of skin: Secondary | ICD-10-CM

## 2015-06-14 DIAGNOSIS — R739 Hyperglycemia, unspecified: Secondary | ICD-10-CM | POA: Diagnosis not present

## 2015-06-14 DIAGNOSIS — Z860100 Personal history of colon polyps, unspecified: Secondary | ICD-10-CM

## 2015-06-14 DIAGNOSIS — M542 Cervicalgia: Secondary | ICD-10-CM

## 2015-06-14 DIAGNOSIS — Z23 Encounter for immunization: Secondary | ICD-10-CM

## 2015-06-14 LAB — HEMOGLOBIN A1C, FINGERSTICK: Hgb A1C (fingerstick): 6.3 % — ABNORMAL HIGH (ref <5.7)

## 2015-06-14 NOTE — Addendum Note (Signed)
Addended by: Haskel Khan A on: 06/14/2015 09:21 AM   Modules accepted: Orders

## 2015-06-14 NOTE — Progress Notes (Addendum)
Patient ID: Ian Leblanc MRN: [id number], DOB: [birth year], 54 y.o. Date of Encounter: @DATE @  Chief Complaint:  Chief Complaint  Patient presents with  . 3 mth check up    is fasting    HPI: 54 y.o. year old white male  presents for routine followup office visit.  Also, he had 2 office visits with me in May 2016 regarding pain in his left shoulder and pain in his left neck. XRay cervical spine did show significant findings. Patient stated that he knew Dr. Earle Gell personally and wanted to see him for follow-up. Patient tells me that his wife has taken care of Dr. Arnoldo Morale children and takes care of his pets when they are out of town.  Patient knows Dr. Arnoldo Morale and his mother who now lives with Dr. Arnoldo Morale.  He says that he has a great man. Says that his children are still not driving age and thinks that Dr. Arnoldo Morale is in his 29s. Says that because of his insurance deductible, they decided to wait to do his MRI once the new "calendar year "starts regarding his insurance deductible. Says this restarts in October.  Says that way if he needs surgery, the MRI and the surgery would all be done within one calendar year and he would just pay the deductible that one time. He states that the prednisone I gave helped A LOT. Says that the paresthesias resolved completely. Says that he has just recently started to get a little bit of tingling into his fingers again but that it is very minimal. Today I discussed with him prednisone and the fact that you do not want to use it more frequently then absolutely necessary but at the same time if it is needed for him to call me and we can prescribe another round of this to hold him over until October. At [place] 06/14/2015: He has had the MRI. He had follow-up visit with Dr. Arnoldo Morale. Says that Dr. Arnoldo Morale explained to him that surgery may or may not help the symptoms patient has in his left arm. However, told him that eventually he would have to have surgery  because it is "closing in on the nerves ". However patient states that as long as he avoids pulling motion, he has no increase in symptoms. Says that even when he plays golf he says that the symptoms are no worse at the end of golf than they were at the beginning and that they are no worse the following day either. Says that he is supposed to have a follow-up visit with Dr. Arnoldo Morale in 6 months. ADDENDUM--ADDED 06/23/2015--     I received OV Note from Dr. Arnoldo Morale dated 06/23/15---     This office note states that Dr. Arnoldo Morale reviewed patient's cervical MRI performed 06/07/15.      He reviewed the MRI with the patient and discussed the situation with the patient. They also discussed the various treatment options. He suggested that if it is not bothering him much that he "live with it ". Also       discussed alternative treatment options such as physical therapy, cervical injections etc. Also discussed surgical treatment option of a C5-C6 and C6-C7 anterior cervical discectomy, fusion, interbody                      prosthesis and anterior plating. He described the surgery to the patient. Scars the risk. Also discussed alternative treatment options including doing nothing, continuing medical management, physical therapy,  chiropractic care, steroid injections, etc. He answered all of patient's questions. Asked patient to read over the surgical pamphlet and decide what he wants to do. Lantus see him back in 6 months to check his      progress. Call sooner if wants to schedule anything in the interim.  OV 06/14/2015; He states that he did increase the metformin to 1000 mg twice a day as instructed when we obtained A1c result 03/11/15. Also, reviewed his weights. He has lost a few more pounds since his last visit. 03/11/15 was 241 pounds. Today 237.  ALL HIS CHILDREN AND GRANDCHILDREN WILL BE HERE THIS CHRISTMAS   At his  office visit with me on 03/05/13 he had lost 13 pounds since his prior visit in April.  He reported that he was walking every morning for 25 minutes. He was also doing a lot of work on his house.  Also he made a lot of diet changes. For breakfast he was eating  bar. For lunch he eats with his mother and was eating vegetables which were cooked in a healthy manner. For dinner his wife is following a carbohydrate handout in regards to what she was cooking. Prior to that he had been eating a lot at dinnertime and eating late. He has started eating earlier and decreasing his portions. As well he decrease bread intake a lot. At visit 05/2013 his weight was only up 2 pounds compared to her was in July he is maintain this pretty well.  At [place] 03/09/14 he reports he has lost 9 lbs since LOV here 4/15--says the only thing he has changed is that he has stopped eating at night. Says that per his dinner meal he has tried to decrease his portions. Says he was raised that you eat whatever is on your plate. Therefore he is recently decreased the amount that he is putting on his plate !!  Also, not eating any other snacks after dinner.  At his  office visit 4/15 labs revealed A1c 7.2. LDL 126. At that time recommended starting metformin 500 mg twice a day. Also to increase simvastatin from 20 mg to 40 mg. At f/u 02/2014 he reports that he made both of these changes. Says that he never had any diarrhea or other GI adverse effects with the metformin. He has noticed no symptoms with taking the metformin and the increase simvastatin.  At [place] 11/2014--says that he has lost 6 pounds since his last visit. Make sure he walks some each day. Also is playing golf 9 holes 2 days a week and walks this 9 holes.   He has a grandson who lives nearby. He is 83 years old. He sees him regularly.  His son who lives in [place]  had another baby --born 05/2014.  He also has a 54-year-old grandchild who lives in [place]. They usually come every August. They did come again this August 2015. However, at the visit in April 2016 patient  states that this year they are not coming  during the summer. Instead they are going to come Christmas. This will be the first time in 6 years ago he has been for Christmas so patient's wife is very excited.   In past, has told me  that his son is a Radio producer and his wife can easily take off a high amount of work. Says that when they come here to visit they are able to come and stay a whole month.   06/2013 he went to the emergency  room with episode of chest pain.  had a myocardial perfusion scan on 07/23/13 which was normal. 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 Says that the cardiologist told him he had inhaled mold from the leaves and about was the cause of his symptoms.     Past Medical History  Diagnosis Date  . Hypertension   . Hyperlipidemia   . Sessile colonic polyp   . Hyperglycemia      Home Meds:  Outpatient Prescriptions Prior to Visit  Medication Sig Dispense Refill  . aspirin 81 MG tablet Take 81 mg by mouth daily.    . benazepril (LOTENSIN) 20 MG tablet Take 1 tablet (20 mg total) by mouth daily. 90 tablet 0  . hydrochlorothiazide (MICROZIDE) 12.5 MG capsule Take 1 capsule (12.5 mg total) by mouth daily. 90 capsule 0  . metaxalone (SKELAXIN) 800 MG tablet TAKE ONE TABLET BY MOUTH THREE TIMES DAILY 30 tablet 0  . metFORMIN (GLUCOPHAGE) 1000 MG tablet Take 1 tablet (1,000 mg total) by mouth 2 (two) times daily with a meal. 180 tablet 0  . Multiple Vitamins-Minerals (MULTIVITAMIN PO) Take 1 tablet by mouth daily. Over 50 mvi qd    . Omega-3 Fatty Acids (OMEGA-3 FISH OIL PO) Take 600 mg by mouth daily.     . simvastatin (ZOCOR) 80 MG tablet Take 1 tablet (80 mg total) by mouth daily. 90 tablet 0  . traMADol (ULTRAM) 50 MG tablet Take 1 tablet (50 mg total) by mouth every 8 (eight) hours as needed. (Patient not taking: Reported on 06/14/2015)  60 tablet 0  . predniSONE (DELTASONE) 20 MG tablet Take 3 daily for 2 days, then 2 daily for 2 days, then 1 daily for 2 days. 12 tablet 0   No facility-administered medications prior to visit.     Allergies:  Allergies  Allergen Reactions  . Crestor [Rosuvastatin] Other (See Comments)    Severe myalgias    Social History   Social History  . Marital Status: Married    Spouse Name: N/A  . Number of Children: N/A  . Years of Education: N/A   Occupational History  . Not on file.   Social History Main Topics  . Smoking status: Former Smoker    Quit date: 07/17/2003  . Smokeless tobacco: Never Used  . Alcohol Use: No  . Drug Use: No  . Sexual Activity: Not on file   Other Topics Concern  . Not on file   Social History Narrative    History reviewed. No pertinent family history.   Review of Systems:  See HPI for pertinent ROS. All other ROS negative.    Physical Exam: Blood pressure 110/70, pulse 64, temperature 98.2 F (36.8 C), temperature source Oral, resp. rate 18, weight 237 lb (107.502 kg)., Body mass index is 34.01 kg/(m^2). General: WNWD WM. Appears in no acute distress. Neck: Supple. No thyromegaly. No lymphadenopathy. No Carotid bruits Lungs: Clear bilaterally to auscultation without wheezes, rales, or rhonchi. Breathing is unlabored. Heart: RRR with S1 S2. No murmurs, rubs, or gallops. Abdomen: Soft, non-tender, non-distended with normoactive bowel sounds. No hepatomegaly. No rebound/guarding. No obvious abdominal masses. Musculoskeletal:  Strength and tone normal for age. Extremities/Skin: Warm and dry.  No edema.  Diabetic Foot Exam: He has excellent foot care. There are no calluses or problematic areas. Sensation is intact. Dorsalis pedis  and posterior tibial pulses are 2+ bilaterally. Neuro: Alert and oriented X 3. Moves all extremities spontaneously. Gait is normal. CNII-XII grossly in tact. Psych:  Responds to questions appropriately with a normal  affect.     ASSESSMENT AND PLAN:  54 y.o. year old male with  1. HTN (hypertension) His blood pressure  Is well controlled. Cont curent medications.  2. HLD (hyperlipidemia) 12/09/2014---  increased simvastatin from 40 mg to 80 mg. At visit 03/11/2015 he states that he did make this change. Labs at that time (03/11/2015) did show improved lipid panel and LFTs normal.  3. DIABETES 11/2013 started Metformin.  Also weight down 9 pounds compared to 4/15 secondary to diet changes at night. 02/2014 A1C went down to 6.1 (from 7.2-- 11/2013) 02/2015----increased metformin to 1000 mg twice a day. Patient did make this change/increase.  - Hemoglobin A1c -Micro Albumin---12/09/2014  Gave Pneumovax 23 02/2014.  He is on aspirin 81 mg daily He is on ACE inhibitor He is on statin therapy  Office visit 12/09/2014 discussed diabetic eye exam. Wears glasses so he has an annual on exam. Next exam is due in August. Told him to make sure to inform them that he has diabetes so that they can check for any diabetic changes.  Diabetic foot exam entered into quality metrics 06/14/15  Cervical spine disease Also, he had 2 office visits with me in May 2016 regarding pain in his left shoulder and pain in his left neck. XRay cervical spine did show significant findings. Patient stated that he knew Dr. Earle Gell personally and wanted to see him for follow-up. Patient tells me that his wife has taken care of Dr. Arnoldo Morale children and takes care of his pets when they are out of town.  Patient knows Dr. Arnoldo Morale and his mother who now lives with Dr. Arnoldo Morale.  He says that he has a great man. Says that his children are still not driving age and thinks that Dr. Arnoldo Morale is in his 30s. Says that because of his insurance deductible they decided to wait to do his MRI once the new "calendar year "starts regarding his insurance deductible. Says this restarts in October.  Says that way if he needs surgery, the MRI and the  surgery would all be done within one calendar year and he would just pay the deductible that one time. He states that the prednisone I gave helped A LOT. Says that the paresthesias resolved completely. Says that he has just recently started to get a little bit of tingling into his fingers again but that it is very minimal. Today I discussed with him prednisone and the fact that you do not want to use it more frequently then absolutely necessary but at the same time if it is needed for him to call me and we can prescribe another round of this to hold him over until October.  At the [place] here 06/14/15-- he has had MRI and follow-up visit with Dr. Arnoldo Morale. See history of present illness for further information.    4. Screening for prostate cancer Last PSA was 12/09/14   5. Personal history of colonic polyps Colonoscopy 06/24/09 with polypectomy. Repeat 3-5 years. He did have repeat colonoscopy between April 2015 - October 2015 . Patient states that this showed a very small polyp. Was told to repeat 5 years.  6. Immunizations: Influenza Vaccine--states that he received this at [facility] 2015, Given here 06/14/2015 T. dap 03/2011. Pneumovax 23--Gave here-- 03/09/2014  No Other pneumonia vaccine indicated until age 48 Zostavax--not indicated until age 39. Discuss then.  At office visit 12/09/14 I discussed with patient that if labs remain stable he could wait 6 months for visit. He prefers to be seen every 3 months to closely monitor things. Routine Office visit 3 months. F/U  sooner if needed.   Signed, [address] [place], [place], [facility] 06/14/2015 8:31 AM

## 2015-06-15 ENCOUNTER — Encounter: Payer: Self-pay | Admitting: Family Medicine

## 2015-07-26 ENCOUNTER — Other Ambulatory Visit: Payer: Self-pay | Admitting: Physician Assistant

## 2015-07-26 NOTE — Telephone Encounter (Signed)
Medication refilled per protocol. 

## 2015-09-15 ENCOUNTER — Ambulatory Visit (INDEPENDENT_AMBULATORY_CARE_PROVIDER_SITE_OTHER): Payer: 59 | Admitting: Physician Assistant

## 2015-09-15 ENCOUNTER — Encounter: Payer: Self-pay | Admitting: Physician Assistant

## 2015-09-15 VITALS — BP 130/80 | HR 78 | Temp 97.7°F | Resp 20 | Wt 237.0 lb

## 2015-09-15 DIAGNOSIS — R202 Paresthesia of skin: Secondary | ICD-10-CM

## 2015-09-15 DIAGNOSIS — M438X2 Other specified deforming dorsopathies, cervical region: Secondary | ICD-10-CM | POA: Diagnosis not present

## 2015-09-15 DIAGNOSIS — E1165 Type 2 diabetes mellitus with hyperglycemia: Secondary | ICD-10-CM

## 2015-09-15 DIAGNOSIS — R200 Anesthesia of skin: Secondary | ICD-10-CM | POA: Diagnosis not present

## 2015-09-15 DIAGNOSIS — B9689 Other specified bacterial agents as the cause of diseases classified elsewhere: Secondary | ICD-10-CM

## 2015-09-15 DIAGNOSIS — I10 Essential (primary) hypertension: Secondary | ICD-10-CM

## 2015-09-15 DIAGNOSIS — E785 Hyperlipidemia, unspecified: Secondary | ICD-10-CM | POA: Diagnosis not present

## 2015-09-15 DIAGNOSIS — J988 Other specified respiratory disorders: Secondary | ICD-10-CM

## 2015-09-15 DIAGNOSIS — R739 Hyperglycemia, unspecified: Secondary | ICD-10-CM | POA: Diagnosis not present

## 2015-09-15 DIAGNOSIS — Z86010 Personal history of colonic polyps: Secondary | ICD-10-CM | POA: Diagnosis not present

## 2015-09-15 DIAGNOSIS — M489 Spondylopathy, unspecified: Secondary | ICD-10-CM

## 2015-09-15 DIAGNOSIS — M542 Cervicalgia: Secondary | ICD-10-CM

## 2015-09-15 LAB — COMPLETE METABOLIC PANEL WITH GFR
ALT: 60 U/L — ABNORMAL HIGH (ref 9–46)
AST: 35 U/L (ref 10–35)
Albumin: 4.3 g/dL (ref 3.6–5.1)
Alkaline Phosphatase: 65 U/L (ref 40–115)
BUN: 12 mg/dL (ref 7–25)
CO2: 24 mmol/L (ref 20–31)
Calcium: 9.2 mg/dL (ref 8.6–10.3)
Chloride: 101 mmol/L (ref 98–110)
Creat: 0.72 mg/dL (ref 0.70–1.33)
GFR, Est African American: >89 mL/min (ref >=60)
GFR, Est Non African American: >89 mL/min (ref >=60)
Glucose, Bld: 113 mg/dL — ABNORMAL HIGH (ref 70–99)
Potassium: 4.2 mmol/L (ref 3.5–5.3)
Sodium: 138 mmol/L (ref 135–146)
Total Bilirubin: 0.6 mg/dL (ref 0.2–1.2)
Total Protein: 6.8 g/dL (ref 6.1–8.1)

## 2015-09-15 LAB — LIPID PANEL
Cholesterol: 199 mg/dL (ref 125–200)
HDL: 36 mg/dL — ABNORMAL LOW (ref >=40)
LDL Cholesterol: 114 mg/dL (ref <130)
Total CHOL/HDL Ratio: 5.5 Ratio — ABNORMAL HIGH (ref <=5.0)
Triglycerides: 247 mg/dL — ABNORMAL HIGH (ref <150)
VLDL: 49 mg/dL — ABNORMAL HIGH (ref <30)

## 2015-09-15 LAB — HEMOGLOBIN A1C
Hgb A1c MFr Bld: 6.4 % — ABNORMAL HIGH (ref <5.7)
Mean Plasma Glucose: 137 mg/dL — ABNORMAL HIGH (ref <117)

## 2015-09-15 MED ORDER — HYDROCOD POLST-CPM POLST ER 10-8 MG/5ML PO SUER: 5.0000 mL | Freq: Two times a day (BID) | ORAL | Status: DC | PRN

## 2015-09-15 MED ORDER — LEVOFLOXACIN 750 MG PO TABS: 750.0000 mg | ORAL_TABLET | Freq: Every day | ORAL | Status: DC

## 2015-09-15 NOTE — Progress Notes (Signed)
Patient ID: Ian Leblanc MRN: [id number], DOB: 10-18-1960, 55 y.o. Date of Encounter: @DATE @  Chief Complaint:  Chief Complaint  Patient presents with  . Follow-up    3 mos, lingering cough since christmas    HPI: 55 y.o. year old white male  presents for routine followup office visit.  Also, he had 2 office visits with me in May 2016 regarding pain in his left shoulder and pain in his left neck. XRay cervical spine did show significant findings. Patient stated that he knew Dr. Earle Gell personally and wanted to see him for follow-up. Patient tells me that his wife has taken care of Dr. Arnoldo Morale children and takes care of his pets when they are out of town.  Patient knows Dr. Arnoldo Morale and his mother who now lives with Dr. Arnoldo Morale.  He says that he has a great man. Says that his children are still not driving age and thinks that Dr. Arnoldo Morale is in his 30s. Says that because of his insurance deductible they decided to wait to do his MRI once the new "calendar year "starts regarding his insurance deductible. Says this restarts in October.  Says that way if he needs surgery, the MRI and the surgery would all be done within one calendar year and he would just pay the deductible that one time. He states that the prednisone I gave helped A LOT. Says that the paresthesias resolved completely. Says that he has just recently started to get a little bit of tingling into his fingers again but that it is very minimal. Today I discussed with him prednisone and the fact that you do not want to use it more frequently then absolutely necessary but at the same time if it is needed for him to call me and we can prescribe another round of this to hold him over until October.    At his  office visit with me on 03/05/13 he had lost 13 pounds since his prior visit in April. He reported that he was walking every morning for 25 minutes. He was also doing a lot of work on his house.  Also he made a lot of diet  changes. For breakfast he was eating  bar. For lunch he eats with his mother and was eating vegetables which were cooked in a healthy manner. For dinner his wife is following a carbohydrate handout in regards to what she was cooking. Prior to that he had been eating a lot at dinnertime and eating late. He has started eating earlier and decreasing his portions. As well he decrease bread intake a lot. At visit 05/2013 his weight was only up 2 pounds compared to her was in July he is maintain this pretty well.  At [place] 03/09/14 he reports he has lost 9 lbs since LOV here 4/15--says the only thing he has changed is that he has stopped eating at night. Says that per his dinner meal he has tried to decrease his portions. Says he was raised that you eat whatever is on your plate. Therefore he is recently decreased the amount that he is putting on his plate !!  Also, not eating any other snacks after dinner.  At his  office visit 4/15 labs revealed A1c 7.2. LDL 126. At that time recommended starting metformin 500 mg twice a day. Also to increase simvastatin from 20 mg to 40 mg. At f/u 02/2014 he reports that he made both of these changes. Says that he never had any diarrhea or other  GI adverse effects with the metformin. He has noticed no symptoms with taking the metformin and the increase simvastatin.  At [place] 11/2014--says that he has lost 6 pounds since his last visit. Make sure he walks some each day. Also is playing golf 9 holes 2 days a week and walks this 9 holes. All tumor his grafts.  He has a grandson who lives nearby. He is 71 years old. He sees him regularly.  His son who lives in [place]  had another baby --born 05/2014.  He also has a 41-year-old grandchild who lives in [place]. They usually come every August. They did come again this August 2015. However, at the visit in April 2016 patient states that this year they are not coming  during the summer. Instead they are going to come Christmas. This will be  the first time in 6 years ago he has been for Christmas so patient's wife is very excited.   In past, has told me  that his son is a Radio producer and his wife can easily take off a high amount of work. Says that when they come here to visit they are able to come and stay a whole month.   06/2013 he went to the emergency room with episode of chest pain.  had a myocardial perfusion scan on 07/23/13 which was normal. 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 Says that the cardiologist told him he had inhaled mold from the leaves and about was the cause of his symptoms.  09/15/2015: He says that his son, daughter-in-law, and children did come for Christmas from [place]. Says that it was absolutely wonderful. Patient's mother, who has been a patient here, passed away right before Christmas. However she says that all of this actually worked out. Says that he feels that it was "meant to be" that his son did not come in the summer and came now. Says that the family was able to see his mother prior to her passing. In regards to his mother, he knows that she is in a much better place as she had very poor quality of life for the past few years. He does state that his cervical spine and symptoms down the left arm are "annoying" and he is planning to schedule follow-up visit with Dr. Vivianne Spence he can hear again what his surgical plan would be and "get his head wrapped around it" and start mentally preparing to do surgery.  Says that he is concerned about the risk, but that his symptoms are annoying and that it is very difficult for him to sit still and not do anything--- not help his children when they need him to do something, not play golf etc. Also says that right at Christmas we were booked up so he had to go to an urgent care for respiratory infection. Says he was treated with a  Z-Pak and 2 shots. Says that he still has a lingering cough ever since then it just won't quite go away. No other complaints or concerns. Taking metformin with no adverse effects. Still compliant with diet and exercise. Weight is actually down another few pounds since his last visit. Taking blood pressure medicine as directed with no adverse effects. No lightheadedness. Taking cholesterol medication as directed with no adverse effects. No myalgias.   Past Medical History  Diagnosis Date  . Hypertension   .  Hyperlipidemia   . Sessile colonic polyp   . Hyperglycemia      Home Meds:  Outpatient Prescriptions Prior to Visit  Medication Sig Dispense Refill  . aspirin 81 MG tablet Take 81 mg by mouth daily.    . benazepril (LOTENSIN) 20 MG tablet Take 1 tablet (20 mg total) by mouth daily. 90 tablet 0  . hydrochlorothiazide (MICROZIDE) 12.5 MG capsule Take 1 capsule (12.5 mg total) by mouth daily. 90 capsule 0  . metFORMIN (GLUCOPHAGE) 1000 MG tablet TAKE ONE TABLET BY MOUTH TWICE DAILY WITH  A  MEAL 180 tablet 0  . Multiple Vitamins-Minerals (MULTIVITAMIN PO) Take 1 tablet by mouth daily. Over 50 mvi qd    . Omega-3 Fatty Acids (OMEGA-3 FISH OIL PO) Take 600 mg by mouth daily.     . simvastatin (ZOCOR) 80 MG tablet Take 1 tablet (80 mg total) by mouth daily. 90 tablet 0  . metaxalone (SKELAXIN) 800 MG tablet TAKE ONE TABLET BY MOUTH THREE TIMES DAILY 30 tablet 0  . traMADol (ULTRAM) 50 MG tablet Take 1 tablet (50 mg total) by mouth every 8 (eight) hours as needed. (Patient not taking: Reported on 06/14/2015) 60 tablet 0   No facility-administered medications prior to visit.     Allergies:  Allergies  Allergen Reactions  . Crestor [Rosuvastatin] Other (See Comments)    Severe myalgias    Social History   Social History  . Marital Status: Married    Spouse Name: N/A  . Number of Children: N/A  . Years of Education: N/A   Occupational History  . Not on file.   Social History  Main Topics  . Smoking status: Former Smoker    Quit date: 07/17/2003  . Smokeless tobacco: Never Used  . Alcohol Use: No  . Drug Use: No  . Sexual Activity: Not on file   Other Topics Concern  . Not on file   Social History Narrative    History reviewed. No pertinent family history.   Review of Systems:  See HPI for pertinent ROS. All other ROS negative.    Physical Exam: Blood pressure 130/80, pulse 78, temperature 97.7 F (36.5 C), temperature source Oral, resp. rate 20, weight 237 lb (107.502 kg)., Body mass index is 34.01 kg/(m^2). General: WNWD WM. Appears in no acute distress. Neck: Supple. No thyromegaly. No lymphadenopathy. No Carotid bruits Lungs: Clear bilaterally to auscultation without wheezes, rales, or rhonchi. Breathing is unlabored. Heart: RRR with S1 S2. No murmurs, rubs, or gallops. Abdomen: Soft, non-tender, non-distended with normoactive bowel sounds. No hepatomegaly. No rebound/guarding. No obvious abdominal masses. Musculoskeletal:  Strength and tone normal for age. Extremities/Skin: Warm and dry.  No edema.  Neuro: Alert and oriented X 3. Moves all extremities spontaneously. Gait is normal. CNII-XII grossly in tact. Psych:  Responds to questions appropriately with a normal affect.     ASSESSMENT AND PLAN:  55 y.o. year old male with  1. HTN (hypertension) His blood pressure  Is well controlled. Cont curent medications.  2. HLD (hyperlipidemia) 12/09/2014---  increased simvastatin from 40 mg to 80 mg. 03/11/2015--- rechecked FLP and LFTs. LDL was down to 90. Recheck lab now to monitor.  3. DIABETES 11/2013 started Metformin.  Also weight down 9 pounds compared to 4/15 secondary to diet changes at night. 02/2014 A1C went down to 6.1 (from 7.2-- 11/2013) - Hemoglobin A1c -Micro Albumin---12/09/2014  Gave Pneumovax 23 02/2014.  He is on aspirin 81 mg daily He is on ACE inhibitor He  is on statin therapy  Office visit 12/09/2014 discussed diabetic eye  exam. Wears glasses so he has an annual on exam. Next exam is due in August. Told him to make sure to inform them that he has diabetes so that they can check for any diabetic changes.   Cervical spine disease Also, he had 2 office visits with me in May 2016 regarding pain in his left shoulder and pain in his left neck. XRay cervical spine did show significant findings. Patient stated that he knew Dr. Earle Gell personally and wanted to see him for follow-up. Patient tells me that his wife has taken care of Dr. Arnoldo Morale children and takes care of his pets when they are out of town.  Patient knows Dr. Arnoldo Morale and his mother who now lives with Dr. Arnoldo Morale.  He says that he has a great man. Says that his children are still not driving age and thinks that Dr. Arnoldo Morale is in his 56s. Says that because of his insurance deductible they decided to wait to do his MRI once the new "calendar year "starts regarding his insurance deductible. Says this restarts in October.  Says that way if he needs surgery, the MRI and the surgery would all be done within one calendar year and he would just pay the deductible that one time. He states that the prednisone I gave helped A LOT. Says that the paresthesias resolved completely. Says that he has just recently started to get a little bit of tingling into his fingers again but that it is very minimal. Today I discussed with him prednisone and the fact that you do not want to use it more frequently then absolutely necessary but at the same time if it is needed for him to call me and we can prescribe another round of this to hold him over until October. See HPI for update--at [place] 09/15/15 he does plan to schedule follow-up with Dr. Arnoldo Morale to discuss surgery.   Bacterial respiratory infection Office visit 09/15/15 I gave the following prescriptions. To take the Levaquin as directed. If cough does not resolve upon completion of Levaquin and follow-up and will need to obtain chest  x-ray. - levofloxacin (LEVAQUIN) 750 MG tablet; Take 1 tablet (750 mg total) by mouth daily.  Dispense: 7 tablet; Refill: 0 - chlorpheniramine-HYDROcodone (TUSSIONEX PENNKINETIC ER) 10-8 MG/5ML SUER; Take 5 mLs by mouth every 12 (twelve) hours as needed for cough.  Dispense: 140 mL; Refill: 0    Screening for prostate cancer Last PSA was 06/05/2013 Recheck --12/09/2014  Personal history of colonic polyps Colonoscopy 06/24/09 with polypectomy. Repeat 3-5 years. He did have repeat colonoscopy between April 2015 - October 2015 . Patient states that this showed a very small polyp. Was told to repeat 5 years.  Immunizations: Influenza Vaccine--states that he received this at [facility]--Fall 2015 T. dap 03/2011. Pneumovax 23--Gave here-- 03/09/2014             No Other pneumonia vaccine indicated until age 31 Zostavax--not indicated until age 59. Discuss then.  At office visit 12/09/14 I discussed with patient that if labs remain stable he could wait 6 months for visit. He prefers to be seen every 3 months to closely monitor things. Routine Office visit 3 months. F/U  sooner if needed.   Signed, [id number] [place], [place], [facility] 09/15/2015 10:35 AM

## 2015-09-16 ENCOUNTER — Other Ambulatory Visit: Payer: Self-pay | Admitting: Family Medicine

## 2015-09-16 MED ORDER — ATORVASTATIN CALCIUM 80 MG PO TABS: 80.0000 mg | ORAL_TABLET | Freq: Every day | ORAL | Status: DC

## 2015-10-16 ENCOUNTER — Other Ambulatory Visit: Payer: Self-pay | Admitting: Physician Assistant

## 2015-10-18 NOTE — Telephone Encounter (Signed)
Medication refilled per protocol. 

## 2015-11-07 ENCOUNTER — Other Ambulatory Visit: Payer: Self-pay | Admitting: Physician Assistant

## 2015-11-08 NOTE — Telephone Encounter (Signed)
Medication refilled per protocol. 

## 2015-12-16 ENCOUNTER — Encounter: Payer: Self-pay | Admitting: Physician Assistant

## 2015-12-16 ENCOUNTER — Ambulatory Visit (INDEPENDENT_AMBULATORY_CARE_PROVIDER_SITE_OTHER): Payer: 59 | Admitting: Physician Assistant

## 2015-12-16 VITALS — BP 114/80 | HR 64 | Temp 97.8°F | Resp 18 | Wt 237.0 lb

## 2015-12-16 DIAGNOSIS — R200 Anesthesia of skin: Secondary | ICD-10-CM | POA: Diagnosis not present

## 2015-12-16 DIAGNOSIS — Z86010 Personal history of colonic polyps: Secondary | ICD-10-CM | POA: Diagnosis not present

## 2015-12-16 DIAGNOSIS — E1165 Type 2 diabetes mellitus with hyperglycemia: Secondary | ICD-10-CM

## 2015-12-16 DIAGNOSIS — M438X2 Other specified deforming dorsopathies, cervical region: Secondary | ICD-10-CM | POA: Diagnosis not present

## 2015-12-16 DIAGNOSIS — M489 Spondylopathy, unspecified: Secondary | ICD-10-CM

## 2015-12-16 DIAGNOSIS — R202 Paresthesia of skin: Secondary | ICD-10-CM

## 2015-12-16 DIAGNOSIS — I10 Essential (primary) hypertension: Secondary | ICD-10-CM | POA: Diagnosis not present

## 2015-12-16 DIAGNOSIS — R739 Hyperglycemia, unspecified: Secondary | ICD-10-CM | POA: Diagnosis not present

## 2015-12-16 DIAGNOSIS — Z125 Encounter for screening for malignant neoplasm of prostate: Secondary | ICD-10-CM | POA: Diagnosis not present

## 2015-12-16 DIAGNOSIS — E785 Hyperlipidemia, unspecified: Secondary | ICD-10-CM

## 2015-12-16 DIAGNOSIS — M542 Cervicalgia: Secondary | ICD-10-CM

## 2015-12-16 LAB — HEMOGLOBIN A1C
Hgb A1c MFr Bld: 6.6 % — ABNORMAL HIGH (ref <5.7)
Mean Plasma Glucose: 143 mg/dL

## 2015-12-16 NOTE — Progress Notes (Addendum)
Patient ID: Geffery Sculthorpe MRN: [id number], DOB: 11/15/1960, 55 y.o. Date of Encounter: @DATE @  Chief Complaint:  Chief Complaint  Patient presents with  . 3 mth check up    is fasting    HPI: 55 y.o. year old white male  presents for routine followup office visit.  Also, he had 2 office visits with me in May 2016 regarding pain in his left shoulder and pain in his left neck. XRay cervical spine did show significant findings. Patient stated that he knew Dr. Earle Gell personally and wanted to see him for follow-up. Patient tells me that his wife has taken care of Dr. Arnoldo Morale children and takes care of his pets when they are out of town.  Patient knows Dr. Arnoldo Morale and his mother who now lives with Dr. Arnoldo Morale.  He says that he has a great man. Says that his children are still not driving age and thinks that Dr. Arnoldo Morale is in his 57s. Says that because of his insurance deductible they decided to wait to do his MRI once the new "calendar year "starts regarding his insurance deductible. Says this restarts in October.  Says that way if he needs surgery, the MRI and the surgery would all be done within one calendar year and he would just pay the deductible that one time. He states that the prednisone I gave helped A LOT. Says that the paresthesias resolved completely. Says that he has just recently started to get a little bit of tingling into his fingers again but that it is very minimal. Today I discussed with him prednisone and the fact that you do not want to use it more frequently then absolutely necessary but at the same time if it is needed for him to call me and we can prescribe another round of this to hold him over until October.    At his  office visit with me on 03/05/13 he had lost 13 pounds since his prior visit in April. He reported that he was walking every morning for 25 minutes. He was also doing a lot of work on his house.  Also he made a lot of diet changes. For breakfast he  was eating  bar. For lunch he eats with his mother and was eating vegetables which were cooked in a healthy manner. For dinner his wife is following a carbohydrate handout in regards to what she was cooking. Prior to that he had been eating a lot at dinnertime and eating late. He has started eating earlier and decreasing his portions. As well he decrease bread intake a lot. At visit 05/2013 his weight was only up 2 pounds compared to her was in July he is maintain this pretty well.  At [place] 03/09/14 he reports he has lost 9 lbs since LOV here 4/15--says the only thing he has changed is that he has stopped eating at night. Says that per his dinner meal he has tried to decrease his portions. Says he was raised that you eat whatever is on your plate. Therefore he is recently decreased the amount that he is putting on his plate !!  Also, not eating any other snacks after dinner.  At his  office visit 4/15 labs revealed A1c 7.2. LDL 126. At that time recommended starting metformin 500 mg twice a day. Also to increase simvastatin from 20 mg to 40 mg. At f/u 02/2014 he reports that he made both of these changes. Says that he never had any diarrhea or other GI  adverse effects with the metformin. He has noticed no symptoms with taking the metformin and the increase simvastatin.  At [place] 11/2014--says that he has lost 6 pounds since his last visit. Make sure he walks some each day. Also is playing golf 9 holes 2 days a week and walks this 9 holes. All tumor his grafts.  He has a grandson who lives nearby. He is 68 years old. He sees him regularly.  His son who lives in [place]  had another baby --born 05/2014.  He also has a 55-year-old grandchild who lives in [place]. They usually come every August. They did come again this August 2015. However, at the visit in April 2016 patient states that this year they are not coming  during the summer. Instead they are going to come Christmas. This will be the first time in 6 years  ago he has been for Christmas so patient's wife is very excited.   In past, has told me  that his son is a Radio producer and his wife can easily take off a high amount of work. Says that when they come here to visit they are able to come and stay a whole month.   06/2013 he went to the emergency room with episode of chest pain.  had a myocardial perfusion scan on 07/23/13 which was normal. 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 Says that the cardiologist told him he had inhaled mold from the leaves and about was the cause of his symptoms.  09/15/2015: He says that his son, daughter-in-law, and children did come for Christmas from [place]. Says that it was absolutely wonderful. Patient's mother, who has been a patient here, passed away right before Christmas. However she says that all of this actually worked out. Says that he feels that it was "meant to be" that his son did not come in the summer and came now. Says that the family was able to see his mother prior to her passing. In regards to his mother, he knows that she is in a much better place as she had very poor quality of life for the past few years. He does state that his cervical spine and symptoms down the left arm are "annoying" and he is planning to schedule follow-up visit with Dr. Vivianne Spence he can hear again what his surgical plan would be and "get his head wrapped around it" and start mentally preparing to do surgery.  Says that he is concerned about the risk, but that his symptoms are annoying and that it is very difficult for him to sit still and not do anything--- not help his children when they need him to do something, not play golf etc. Also says that right at Christmas we were booked up so he had to go to an urgent care for respiratory infection. Says he was treated with a Z-Pak and 2 shots. Says that  he still has a lingering cough ever since then it just won't quite go away. No other complaints or concerns. Taking metformin with no adverse effects. Still compliant with diet and exercise. Weight is actually down another few pounds since his last visit. Taking blood pressure medicine as directed with no adverse effects. No lightheadedness. Taking cholesterol medication as directed with no adverse effects. No myalgias.  12/16/2015: Regarding his grandchildren--says that family that is local had another  baby---says his wife loves the baby but he prefers to play with the older child!  Says the son in [place] also has had another one--so their whole family will not be coming--just his son and older child will come next time. (? This summer, I think?) Says he scheduled f/u with Dr. Arnoldo Morale but he was booked out so his appt isn't until August. Says he just has a constant achy, nagging discomfort. Not having any more paresthesias etc down arm or hand. Avoids pulling on things. Is able to play golf without increased symptoms.  Taking all meds as directed. No adv effects.  Reviewed that labs performed at [facility]--09/15/15--LDL was 114--recommended he change Zocor 80 to Lipitor 80mg . He says he did make that change and he is fasting today.    ADDENDUM ADDED 03/02/2016: I received office note from Dr. Earle Gell at [facility]. His note dated 03/02/16. At that visit Mr. Graeser told him that his neck was not bothering him much at this point. Was having no radicular symptoms. Had limited his activity somewhat. Was not taking any pain medications. On exam his strength and reflexes were normal. Was felt that he does not need intervention at this point. Will plan to see him back in 6 months.  Past Medical History  Diagnosis Date  . Hypertension   . Hyperlipidemia   . Sessile colonic polyp   . Hyperglycemia      Home Meds:  Outpatient Prescriptions Prior to Visit  Medication Sig Dispense  Refill  . aspirin 81 MG tablet Take 81 mg by mouth daily.    Marland Kitchen atorvastatin (LIPITOR) 80 MG tablet Take 1 tablet (80 mg total) by mouth daily. 90 tablet 0  . benazepril (LOTENSIN) 20 MG tablet TAKE ONE TABLET BY MOUTH DAILY 90 tablet 0  . hydrochlorothiazide (MICROZIDE) 12.5 MG capsule Take 1 capsule (12.5 mg total) by mouth daily. 90 capsule 0  . metFORMIN (GLUCOPHAGE) 1000 MG tablet TAKE ONE TABLET BY MOUTH TWICE DAILY WITH A MEAL 180 tablet 0  . Multiple Vitamins-Minerals (MULTIVITAMIN PO) Take 1 tablet by mouth daily. Over 50 mvi qd    . Omega-3 Fatty Acids (OMEGA-3 FISH OIL PO) Take 600 mg by mouth daily.     . chlorpheniramine-HYDROcodone (TUSSIONEX PENNKINETIC ER) 10-8 MG/5ML SUER Take 5 mLs by mouth every 12 (twelve) hours as needed for cough. (Patient not taking: Reported on 12/16/2015) 140 mL 0  . levofloxacin (LEVAQUIN) 750 MG tablet Take 1 tablet (750 mg total) by mouth daily. 7 tablet 0   No facility-administered medications prior to visit.     Allergies:  Allergies  Allergen Reactions  . Crestor [Rosuvastatin] Other (See Comments)    Severe myalgias    Social History   Social History  . Marital Status: Married    Spouse Name: N/A  . Number of Children: N/A  . Years of Education: N/A   Occupational History  . Not on file.   Social History Main Topics  . Smoking status: Former Smoker    Quit date: 07/17/2003  . Smokeless tobacco: Never Used  . Alcohol Use: No  . Drug Use: No  . Sexual Activity: Not on file   Other Topics Concern  . Not on file   Social History Narrative    No family history on file.   Review of Systems:  See HPI for pertinent ROS. All other ROS negative.    Physical Exam: Blood pressure 114/80, pulse 64, temperature 97.8 F (36.6 C),  temperature source Oral, resp. rate 18, weight 237 lb (107.502 kg)., Body mass index is 34.01 kg/(m^2). General: WNWD WM. Appears in no acute distress. Neck: Supple. No thyromegaly. No lymphadenopathy.  No Carotid bruits Lungs: Clear bilaterally to auscultation without wheezes, rales, or rhonchi. Breathing is unlabored. Heart: RRR with S1 S2. No murmurs, rubs, or gallops. Abdomen: Soft, non-tender, non-distended with normoactive bowel sounds. No hepatomegaly. No rebound/guarding. No obvious abdominal masses. Musculoskeletal:  Strength and tone normal for age. Extremities/Skin: Warm and dry.  No edema.  Neuro: Alert and oriented X 3. Moves all extremities spontaneously. Gait is normal. CNII-XII grossly in tact. Psych:  Responds to questions appropriately with a normal affect.     ASSESSMENT AND PLAN:  55 y.o. year old male with  1. HTN (hypertension) His blood pressure  Is well controlled. Cont curent medications.  2. HLD (hyperlipidemia) 12/09/2014---  increased simvastatin from 40 mg to 80 mg. 03/11/2015--- rechecked FLP and LFTs. LDL was down to 90. 09/15/2015---LDL 114--Changed Zocor 80 to Lipitor 80mg .  12/16/15---Recheck lab on Lipito 80mg   3. DIABETES 11/2013 started Metformin.  Also weight down 9 pounds compared to 4/15 secondary to diet changes at night. 02/2014 A1C went down to 6.1 (from 7.2-- 11/2013) - Hemoglobin A1c -Micro Albumin---12/09/2014  Gave Pneumovax 23 02/2014.  He is on aspirin 81 mg daily He is on ACE inhibitor He is on statin therapy  Office visit 12/09/2014 discussed diabetic eye exam. Wears glasses so he has an annual on exam. Next exam is due in August. Told him to make sure to inform them that he has diabetes so that they can check for any diabetic changes.   Cervical spine disease Also, he had 2 office visits with me in May 2016 regarding pain in his left shoulder and pain in his left neck. XRay cervical spine did show significant findings. Patient stated that he knew Dr. Earle Gell personally and wanted to see him for follow-up. Patient tells me that his wife has taken care of Dr. Arnoldo Morale children and takes care of his pets when they are out of town.   Patient knows Dr. Arnoldo Morale and his mother who now lives with Dr. Arnoldo Morale.  He says that he has a great man. Says that his children are still not driving age and thinks that Dr. Arnoldo Morale is in his 21s. Says that because of his insurance deductible they decided to wait to do his MRI once the new "calendar year "starts regarding his insurance deductible. Says this restarts in October.  Says that way if he needs surgery, the MRI and the surgery would all be done within one calendar year and he would just pay the deductible that one time. He states that the prednisone I gave helped A LOT. Says that the paresthesias resolved completely. Says that he has just recently started to get a little bit of tingling into his fingers again but that it is very minimal. Today I discussed with him prednisone and the fact that you do not want to use it more frequently then absolutely necessary but at the same time if it is needed for him to call me and we can prescribe another round of this to hold him over until October. See HPI for update--at [facility] 09/15/15 and 12/16/2015 he does plan to schedule follow-up with Dr. Arnoldo Morale to discuss surgery.  ADDENDUM ADDED 03/02/2016: I received office note from Dr. Earle Gell at [facility]. His note dated 03/02/16. At that visit Mr. Sunshine told  him that his neck was not bothering him much at this point. Was having no radicular symptoms. Had limited his activity somewhat. Was not taking any pain medications. On exam his strength and reflexes were normal. Was felt that he does not need intervention at this point. Will plan to see him back in 6 months.   Screening for prostate cancer -- PSA was 06/05/2013              Recheck --12/09/2014              Recheck 12/16/15  Personal history of colonic polyps Colonoscopy 06/24/09 with polypectomy. Repeat 3-5 years. He did have repeat colonoscopy between April 2015 - October 2015 . Patient states that this showed a very small  polyp. Was told to repeat 5 years.  Immunizations: Influenza Vaccine--states that he received this at [facility]--Fall 2015 T. dap 03/2011. Pneumovax 23--Gave here-- 03/09/2014             No Other pneumonia vaccine indicated until age 9 Zostavax--not indicated until age 54. Discuss then.  At office visit 12/09/14 I discussed with patient that if labs remain stable he could wait 6 months for visit. He prefers to be seen every 3 months to closely monitor things. Routine Office visit 3 months. F/U  sooner if needed.   Marin Olp [place], [place], [facility] 12/16/2015 8:44 AM

## 2015-12-17 LAB — COMPLETE METABOLIC PANEL WITH GFR
ALT: 70 U/L — ABNORMAL HIGH (ref 9–46)
AST: 32 U/L (ref 10–35)
Albumin: 4.3 g/dL (ref 3.6–5.1)
Alkaline Phosphatase: 78 U/L (ref 40–115)
BUN: 16 mg/dL (ref 7–25)
CO2: 20 mmol/L (ref 20–31)
Calcium: 9.3 mg/dL (ref 8.6–10.3)
Chloride: 105 mmol/L (ref 98–110)
Creat: 0.67 mg/dL — ABNORMAL LOW (ref 0.70–1.33)
GFR, Est African American: >89 mL/min (ref >=60)
GFR, Est Non African American: >89 mL/min (ref >=60)
Glucose, Bld: 103 mg/dL — ABNORMAL HIGH (ref 70–99)
Potassium: 4.1 mmol/L (ref 3.5–5.3)
Sodium: 139 mmol/L (ref 135–146)
Total Bilirubin: 0.5 mg/dL (ref 0.2–1.2)
Total Protein: 6.4 g/dL (ref 6.1–8.1)

## 2015-12-17 LAB — LIPID PANEL
Cholesterol: 131 mg/dL (ref 125–200)
HDL: 34 mg/dL — ABNORMAL LOW (ref >=40)
LDL Cholesterol: 72 mg/dL (ref <130)
Total CHOL/HDL Ratio: 3.9 Ratio (ref <=5.0)
Triglycerides: 126 mg/dL (ref <150)
VLDL: 25 mg/dL (ref <30)

## 2015-12-17 LAB — PSA: PSA: 0.88 ng/mL (ref <=4.00)

## 2015-12-19 ENCOUNTER — Other Ambulatory Visit: Payer: Self-pay | Admitting: Physician Assistant

## 2015-12-20 NOTE — Telephone Encounter (Signed)
Medication refilled per protocol. 

## 2015-12-21 ENCOUNTER — Encounter: Payer: Self-pay | Admitting: Family Medicine

## 2016-01-27 ENCOUNTER — Other Ambulatory Visit: Payer: Self-pay | Admitting: Physician Assistant

## 2016-02-09 ENCOUNTER — Other Ambulatory Visit: Payer: Self-pay | Admitting: Physician Assistant

## 2016-02-09 NOTE — Telephone Encounter (Signed)
Medication refilled per protocol. 

## 2016-02-23 ENCOUNTER — Other Ambulatory Visit: Payer: Self-pay | Admitting: Physician Assistant

## 2016-02-23 NOTE — Telephone Encounter (Signed)
Medication refilled per protocol. 

## 2016-04-03 ENCOUNTER — Encounter: Payer: Self-pay | Admitting: Physician Assistant

## 2016-04-03 ENCOUNTER — Ambulatory Visit (INDEPENDENT_AMBULATORY_CARE_PROVIDER_SITE_OTHER): Payer: 59 | Admitting: Physician Assistant

## 2016-04-03 VITALS — BP 134/90 | HR 88 | Temp 98.6°F | Resp 16 | Ht 70.0 in | Wt 236.0 lb

## 2016-04-03 DIAGNOSIS — R739 Hyperglycemia, unspecified: Secondary | ICD-10-CM | POA: Diagnosis not present

## 2016-04-03 DIAGNOSIS — M438X2 Other specified deforming dorsopathies, cervical region: Secondary | ICD-10-CM

## 2016-04-03 DIAGNOSIS — J988 Other specified respiratory disorders: Secondary | ICD-10-CM | POA: Diagnosis not present

## 2016-04-03 DIAGNOSIS — E785 Hyperlipidemia, unspecified: Secondary | ICD-10-CM

## 2016-04-03 DIAGNOSIS — B9689 Other specified bacterial agents as the cause of diseases classified elsewhere: Principal | ICD-10-CM

## 2016-04-03 DIAGNOSIS — M542 Cervicalgia: Secondary | ICD-10-CM

## 2016-04-03 DIAGNOSIS — Z86010 Personal history of colonic polyps: Secondary | ICD-10-CM

## 2016-04-03 DIAGNOSIS — E1165 Type 2 diabetes mellitus with hyperglycemia: Secondary | ICD-10-CM

## 2016-04-03 DIAGNOSIS — R202 Paresthesia of skin: Secondary | ICD-10-CM

## 2016-04-03 DIAGNOSIS — M489 Spondylopathy, unspecified: Secondary | ICD-10-CM

## 2016-04-03 DIAGNOSIS — Z125 Encounter for screening for malignant neoplasm of prostate: Secondary | ICD-10-CM | POA: Diagnosis not present

## 2016-04-03 DIAGNOSIS — I10 Essential (primary) hypertension: Secondary | ICD-10-CM

## 2016-04-03 MED ORDER — FLUTICASONE PROPIONATE 50 MCG/ACT NA SUSP: 2.0000 | Freq: Every day | NASAL | 6 refills | Status: DC

## 2016-04-03 MED ORDER — AMOXICILLIN-POT CLAVULANATE 875-125 MG PO TABS: 1.0000 | ORAL_TABLET | Freq: Two times a day (BID) | ORAL | 0 refills | Status: DC

## 2016-04-03 NOTE — Progress Notes (Signed)
Patient ID: Ian Leblanc MRN: [id number], DOB: [birth year], 55 y.o. Date of Encounter: @DATE @  Chief Complaint:  Chief Complaint  Patient presents with  . Follow-up    Pt complains of night sweats, no other problems    HPI: 55 y.o. year old white male  presents for routine followup office visit.  Also, he had 2 office visits with me in May 2016 regarding pain in his left shoulder and pain in his left neck. XRay cervical spine did show significant findings. Patient stated that he knew Dr. Earle Gell personally and wanted to see him for follow-up. Patient tells me that his wife has taken care of Dr. Arnoldo Morale children and takes care of his pets when they are out of town.  Patient knows Dr. Arnoldo Morale and his mother who now lives with Dr. Arnoldo Morale.  He says that he has a great man. Says that his children are still not driving age and thinks that Dr. Arnoldo Morale is in his 2s. Says that because of his insurance deductible they decided to wait to do his MRI once the new "calendar year "starts regarding his insurance deductible. Says this restarts in October.  Says that way if he needs surgery, the MRI and the surgery would all be done within one calendar year and he would just pay the deductible that one time. He states that the prednisone I gave helped A LOT. Says that the paresthesias resolved completely. Says that he has just recently started to get a little bit of tingling into his fingers again but that it is very minimal. Today I discussed with him prednisone and the fact that you do not want to use it more frequently then absolutely necessary but at the same time if it is needed for him to call me and we can prescribe another round of this to hold him over until October.    At his  office visit with me on 03/05/13 he had lost 13 pounds since his prior visit in April. He reported that he was walking every morning for 25 minutes. He was also doing a lot of work on his house.  Also he made a lot of  diet changes. For breakfast he was eating  bar. For lunch he eats with his mother and was eating vegetables which were cooked in a healthy manner. For dinner his wife is following a carbohydrate handout in regards to what she was cooking. Prior to that he had been eating a lot at dinnertime and eating late. He has started eating earlier and decreasing his portions. As well he decrease bread intake a lot. At visit 05/2013 his weight was only up 2 pounds compared to her was in July he is maintain this pretty well.  At [place] 03/09/14 he reports he has lost 9 lbs since LOV here 4/15--says the only thing he has changed is that he has stopped eating at night. Says that per his dinner meal he has tried to decrease his portions. Says he was raised that you eat whatever is on your plate. Therefore he is recently decreased the amount that he is putting on his plate !!  Also, not eating any other snacks after dinner.  At his  office visit 4/15 labs revealed A1c 7.2. LDL 126. At that time recommended starting metformin 500 mg twice a day. Also to increase simvastatin from 20 mg to 40 mg. At f/u 02/2014 he reports that he made both of these changes. Says that he never had any diarrhea  or other GI adverse effects with the metformin. He has noticed no symptoms with taking the metformin and the increase simvastatin.  At [place] 11/2014--says that he has lost 6 pounds since his last visit. Make sure he walks some each day. Also is playing golf 9 holes 2 days a week and walks this 9 holes. All tumor his grafts.  He has a grandson who lives nearby. He is 57 years old. He sees him regularly.  His son who lives in [place]  had another baby --born 05/2014.  He also has a 20-year-old grandchild who lives in [place]. They usually come every August. They did come again this August 2015. However, at the visit in April 2016 patient states that this year they are not coming  during the summer. Instead they are going to come Christmas. This  will be the first time in 6 years ago he has been for Christmas so patient's wife is very excited.   In past, has told me  that his son is a Radio producer and his wife can easily take off a high amount of work. Says that when they come here to visit they are able to come and stay a whole month.   06/2013 he went to the emergency room with episode of chest pain.  had a myocardial perfusion scan on 07/23/13 which was normal. 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 Says that the cardiologist told him he had inhaled mold from the leaves and about was the cause of his symptoms.  09/15/2015: He says that his son, daughter-in-law, and children did come for Christmas from [place]. Says that it was absolutely wonderful. Patient's mother, who has been a patient here, passed away right before Christmas. However she says that all of this actually worked out. Says that he feels that it was "meant to be" that his son did not come in the summer and came now. Says that the family was able to see his mother prior to her passing. In regards to his mother, he knows that she is in a much better place as she had very poor quality of life for the past few years. He does state that his cervical spine and symptoms down the left arm are "annoying" and he is planning to schedule follow-up visit with Dr. Vivianne Spence he can hear again what his surgical plan would be and "get his head wrapped around it" and start mentally preparing to do surgery.  Says that he is concerned about the risk, but that his symptoms are annoying and that it is very difficult for him to sit still and not do anything--- not help his children when they need him to do something, not play golf etc. Also says that right at Christmas we were booked up so he had to go to an urgent care for respiratory infection. Says he was treated  with a Z-Pak and 2 shots. Says that he still has a lingering cough ever since then it just won't quite go away. No other complaints or concerns. Taking metformin with no adverse effects. Still compliant with diet and exercise. Weight is actually down another few pounds since his last visit. Taking blood pressure medicine as directed with no adverse effects. No lightheadedness. Taking cholesterol medication as directed with no adverse effects. No myalgias.  12/16/2015: Regarding his grandchildren--says that family that is  local had another baby---says his wife loves the baby but he prefers to play with the older child!  Says the son in [place] also has had another one--so their whole family will not be coming--just his son and older child will come next time. (? This summer, I think?) Says he scheduled f/u with Dr. Arnoldo Morale but he was booked out so his appt isn't until August. Says he just has a constant achy, nagging discomfort. Not having any more paresthesias etc down arm or hand. Avoids pulling on things. Is able to play golf without increased symptoms.  Taking all meds as directed. No adv effects.  Reviewed that labs performed at [facility]--09/15/15--LDL was 114--recommended he change Zocor 80 to Lipitor 80mg . He says he did make that change and he is fasting today.    ADDENDUM ADDED 03/02/2016: I received office note from Dr. Earle Gell at [facility]. His note dated 03/02/16. At that visit Mr. Jacques told him that his neck was not bothering him much at this point. Was having no radicular symptoms. Had limited his activity somewhat. Was not taking any pain medications. On exam his strength and reflexes were normal. Was felt that he does not need intervention at this point. Will plan to see him back in 6 months.   04/03/2016; He says they are getting ready to go to the beach. The grandkids that live here are going with them. Their parents will come 2 days later.  The son in [place]  decided to wait and come at [place]--so the entire family can come.   He says at night when he sleeps on left side--left side gets stopped up. Rolls onto right side--can breathe.  Blows his nose---it is green mucus.  No chest congestion. No cough. No sore throat.   Taking metformin as directed. Being compliant with diet and exercise.  Taking BP meds as directed. No lightheadedness or other adv effects.  Taking Lipitor as directed. No myalgias or other adv effects.     Past Medical History:  Diagnosis Date  . Hyperglycemia   . Hyperlipidemia   . Hypertension   . Sessile colonic polyp      Home Meds:  Outpatient Medications Prior to Visit  Medication Sig Dispense Refill  . aspirin 81 MG tablet Take 81 mg by mouth daily.    Marland Kitchen atorvastatin (LIPITOR) 80 MG tablet Take 1 tablet (80 mg total) by mouth daily at 6 PM. 90 tablet 1  . benazepril (LOTENSIN) 20 MG tablet TAKE ONE TABLET BY MOUTH ONCE DAILY 90 tablet 3  . hydrochlorothiazide (MICROZIDE) 12.5 MG capsule Take 1 capsule (12.5 mg total) by mouth daily. 90 capsule 0  . metFORMIN (GLUCOPHAGE) 1000 MG tablet TAKE ONE TABLET BY MOUTH TWICE DAILY WITH A MEAL 180 tablet 3  . Multiple Vitamins-Minerals (MULTIVITAMIN PO) Take 1 tablet by mouth daily. Over 50 mvi qd    . Omega-3 Fatty Acids (OMEGA-3 FISH OIL PO) Take 600 mg by mouth daily.     . hydrochlorothiazide (MICROZIDE) 12.5 MG capsule TAKE ONE CAPSULE BY MOUTH DAILY. (Patient not taking: Reported on 04/03/2016) 90 capsule 0   No facility-administered medications prior to visit.      Allergies:  Allergies  Allergen Reactions  . Crestor [Rosuvastatin] Other (See Comments)    Severe myalgias    Social History   Social History  . Marital status: Married    Spouse name: N/A  . Number of children: N/A  . Years of education: N/A  Occupational History  . Not on file.   Social History Main Topics  . Smoking status: Former Smoker    Quit date: 07/17/2003  . Smokeless  tobacco: Never Used  . Alcohol use No  . Drug use: No  . Sexual activity: Not on file   Other Topics Concern  . Not on file   Social History Narrative  . No narrative on file    No family history on file.   Review of Systems:  See HPI for pertinent ROS. All other ROS negative.    Physical Exam: Blood pressure 134/90, pulse 88, temperature 98.6 F (37 C), temperature source Oral, resp. rate 16, height 5\' 10"  (1.778 m), weight 236 lb (107 kg), SpO2 97 %., Body mass index is 33.86 kg/m. General: WNWD WM. Appears in no acute distress. HEENT: Ears: Normal. Throat : Normal. Sinuses: No tenderness with percussion.  Neck: Supple. No thyromegaly. No lymphadenopathy. No Carotid bruits Lungs: Clear bilaterally to auscultation without wheezes, rales, or rhonchi. Breathing is unlabored. Heart: RRR with S1 S2. No murmurs, rubs, or gallops. Abdomen: Soft, non-tender, non-distended with normoactive bowel sounds. No hepatomegaly. No rebound/guarding. No obvious abdominal masses. Musculoskeletal:  Strength and tone normal for age. Extremities/Skin: Warm and dry.  No edema.  Neuro: Alert and oriented X 3. Moves all extremities spontaneously. Gait is normal. CNII-XII grossly in tact. Psych:  Responds to questions appropriately with a normal affect.     ASSESSMENT AND PLAN:  55 y.o. year old male with   Bacterial respiratory infection He is to take abx as directed. Use Flonase. F/u if symptoms do not resolve after completion of abx.  - amoxicillin-clavulanate (AUGMENTIN) 875-125 MG tablet; Take 1 tablet by mouth 2 (two) times daily.  Dispense: 20 tablet; Refill: 0 - fluticasone (FLONASE) 50 MCG/ACT nasal spray; Place 2 sprays into both nostrils daily.  Dispense: 16 g; Refill: 6  HTN (hypertension) His blood pressure  Is well controlled. Cont curent medications.  HLD (hyperlipidemia) 12/09/2014---  increased simvastatin from 40 mg to 80 mg. 03/11/2015--- rechecked FLP and LFTs. LDL was down to  90. 09/15/2015---LDL 114--Changed Zocor 80 to Lipitor 80mg .  12/16/15---Recheck lab on Lipito 80mg   DIABETES 11/2013 started Metformin.  Also weight down 9 pounds compared to 4/15 secondary to diet changes at night. 02/2014 A1C went down to 6.1 (from 7.2-- 11/2013) - Hemoglobin A1c -Micro Albumin---12/09/2014  Gave Pneumovax 23 02/2014.  He is on aspirin 81 mg daily He is on ACE inhibitor He is on statin therapy  Office visit 12/09/2014 discussed diabetic eye exam. Wears glasses so he has an annual on exam. Next exam is due in August. Told him to make sure to inform them that he has diabetes so that they can check for any diabetic changes.   Cervical spine disease Also, he had 2 office visits with me in May 2016 regarding pain in his left shoulder and pain in his left neck. XRay cervical spine did show significant findings. Patient stated that he knew Dr. Earle Gell personally and wanted to see him for follow-up. Patient tells me that his wife has taken care of Dr. Arnoldo Morale children and takes care of his pets when they are out of town.  Patient knows Dr. Arnoldo Morale and his mother who now lives with Dr. Arnoldo Morale.  He says that he has a great man. Says that his children are still not driving age and thinks that Dr. Arnoldo Morale is in his 98s. Says that because of his insurance deductible they  decided to wait to do his MRI once the new "calendar year "starts regarding his insurance deductible. Says this restarts in October.  Says that way if he needs surgery, the MRI and the surgery would all be done within one calendar year and he would just pay the deductible that one time. He states that the prednisone I gave helped A LOT. Says that the paresthesias resolved completely. Says that he has just recently started to get a little bit of tingling into his fingers again but that it is very minimal. Today I discussed with him prednisone and the fact that you do not want to use it more frequently then absolutely  necessary but at the same time if it is needed for him to call me and we can prescribe another round of this to hold him over until October. See HPI for update--at [place] 09/15/15 and 12/16/2015 he does plan to schedule follow-up with Dr. Arnoldo Morale to discuss surgery.  ADDENDUM ADDED 03/02/2016: I received office note from Dr. Earle Gell at [facility]. His note dated 03/02/16. At that visit Mr. Southards told him that his neck was not bothering him much at this point. Was having no radicular symptoms. Had limited his activity somewhat. Was not taking any pain medications. On exam his strength and reflexes were normal. Was felt that he does not need intervention at this point. Will plan to see him back in 6 months.   Screening for prostate cancer -- PSA was 06/05/2013              Recheck --12/09/2014              Recheck 12/16/15  Personal history of colonic polyps Colonoscopy 06/24/09 with polypectomy. Repeat 3-5 years. He did have repeat colonoscopy between April 2015 - October 2015 . Patient states that this showed a very small polyp. Was told to repeat 5 years.  Immunizations: Influenza Vaccine--states that he received this at [facility]--Fall 2015 T. dap 03/2011. Pneumovax 23--Gave here-- 03/09/2014             No Other pneumonia vaccine indicated until age 52 Zostavax--not indicated until age 57. Discuss then.  At office visit 12/09/14 I discussed with patient that if labs remain stable he could wait 6 months for visit. He prefers to be seen every 3 months to closely monitor things. Routine Office visit 3 months. F/U  sooner if needed.   Signed, [address] [place], [place], [facility] 04/03/2016 8:40 AM

## 2016-04-04 LAB — HEMOGLOBIN A1C
Hgb A1c MFr Bld: 7.6 % — ABNORMAL HIGH (ref <5.7)
Mean Plasma Glucose: 171 mg/dL

## 2016-04-06 ENCOUNTER — Other Ambulatory Visit: Payer: Self-pay

## 2016-04-06 DIAGNOSIS — E1165 Type 2 diabetes mellitus with hyperglycemia: Secondary | ICD-10-CM

## 2016-04-06 MED ORDER — PIOGLITAZONE HCL 30 MG PO TABS: 30.0000 mg | ORAL_TABLET | Freq: Every day | ORAL | 3 refills | Status: DC

## 2016-05-18 ENCOUNTER — Other Ambulatory Visit: Payer: Self-pay | Admitting: Physician Assistant

## 2016-07-05 ENCOUNTER — Ambulatory Visit (INDEPENDENT_AMBULATORY_CARE_PROVIDER_SITE_OTHER): Payer: 59 | Admitting: Physician Assistant

## 2016-07-05 ENCOUNTER — Encounter: Payer: Self-pay | Admitting: Physician Assistant

## 2016-07-05 VITALS — BP 140/90 | HR 98 | Temp 98.3°F | Resp 18 | Ht 70.0 in | Wt 232.0 lb

## 2016-07-05 DIAGNOSIS — R52 Pain, unspecified: Secondary | ICD-10-CM | POA: Diagnosis not present

## 2016-07-05 DIAGNOSIS — Z86010 Personal history of colonic polyps: Secondary | ICD-10-CM

## 2016-07-05 DIAGNOSIS — M489 Spondylopathy, unspecified: Secondary | ICD-10-CM

## 2016-07-05 DIAGNOSIS — E785 Hyperlipidemia, unspecified: Secondary | ICD-10-CM

## 2016-07-05 DIAGNOSIS — J101 Influenza due to other identified influenza virus with other respiratory manifestations: Secondary | ICD-10-CM | POA: Diagnosis not present

## 2016-07-05 DIAGNOSIS — M542 Cervicalgia: Secondary | ICD-10-CM | POA: Diagnosis not present

## 2016-07-05 DIAGNOSIS — I10 Essential (primary) hypertension: Secondary | ICD-10-CM

## 2016-07-05 DIAGNOSIS — E119 Type 2 diabetes mellitus without complications: Secondary | ICD-10-CM | POA: Diagnosis not present

## 2016-07-05 DIAGNOSIS — J020 Streptococcal pharyngitis: Secondary | ICD-10-CM

## 2016-07-05 DIAGNOSIS — Z125 Encounter for screening for malignant neoplasm of prostate: Secondary | ICD-10-CM | POA: Diagnosis not present

## 2016-07-05 LAB — LIPID PANEL
Cholesterol: 170 mg/dL (ref <200)
HDL: 42 mg/dL (ref >40)
LDL Cholesterol: 108 mg/dL — ABNORMAL HIGH (ref <100)
Total CHOL/HDL Ratio: 4 Ratio (ref <5.0)
Triglycerides: 100 mg/dL (ref <150)
VLDL: 20 mg/dL (ref <30)

## 2016-07-05 LAB — COMPLETE METABOLIC PANEL WITH GFR
ALT: 59 U/L — ABNORMAL HIGH (ref 9–46)
AST: 45 U/L — ABNORMAL HIGH (ref 10–35)
Albumin: 4.6 g/dL (ref 3.6–5.1)
Alkaline Phosphatase: 68 U/L (ref 40–115)
BUN: 18 mg/dL (ref 7–25)
CO2: 26 mmol/L (ref 20–31)
Calcium: 9.7 mg/dL (ref 8.6–10.3)
Chloride: 96 mmol/L — ABNORMAL LOW (ref 98–110)
Creat: 0.84 mg/dL (ref 0.70–1.33)
GFR, Est African American: >89 mL/min (ref >=60)
GFR, Est Non African American: >89 mL/min (ref >=60)
Glucose, Bld: 103 mg/dL — ABNORMAL HIGH (ref 70–99)
Potassium: 4.3 mmol/L (ref 3.5–5.3)
Sodium: 135 mmol/L (ref 135–146)
Total Bilirubin: 0.9 mg/dL (ref 0.2–1.2)
Total Protein: 7.2 g/dL (ref 6.1–8.1)

## 2016-07-05 LAB — INFLUENZA A AND B AG, IMMUNOASSAY
Influenza A Antigen: DETECTED — AB
Influenza B Antigen: NOT DETECTED

## 2016-07-05 LAB — STREP GROUP A AG, W/REFLEX TO CULT: STREGTOCOCCUS GROUP A AG SCREEN: NOT DETECTED

## 2016-07-05 NOTE — Progress Notes (Signed)
Ian Leblanc ID: Ian Leblanc MRN: [id number], DOB: 05-14-1961, 55 y.o. Date of Encounter: @DATE @  Chief Complaint:  Chief Complaint  Ian Leblanc presents with  . Generalized Body Aches  . Fever  . Hypertension  . Blood Sugar Problem  . Sore Throat    HPI: 55 y.o. year old white male  presents for routine followup office visit.  Also, he had 2 office visits with me in May 2016 regarding pain in his left shoulder and pain in his left neck. XRay cervical spine did show significant findings. Ian Leblanc stated that he knew Dr. Earle Gell personally and wanted to see him for follow-up. Ian Leblanc tells me that his wife has taken care of Dr. Arnoldo Morale children and takes care of his pets when they are out of town.  Ian Leblanc knows Dr. Arnoldo Morale and his mother who now lives with Dr. Arnoldo Morale.  He says that he has a great man. Says that his children are still not driving age and thinks that Dr. Arnoldo Morale is in his 54s. Says that because of his insurance deductible they decided to wait to do his MRI once the new "calendar year "starts regarding his insurance deductible. Says this restarts in October.  Says that way if he needs surgery, the MRI and the surgery would all be done within one calendar year and he would just pay the deductible that one time. He states that the prednisone I gave helped A LOT. Says that the paresthesias resolved completely. Says that he has just recently started to get a little bit of tingling into his fingers again but that it is very minimal. Today I discussed with him prednisone and the fact that you do not want to use it more frequently then absolutely necessary but at the same time if it is needed for him to call me and we can prescribe another round of this to hold him over until October.    At his  office visit with me on 03/05/13 he had lost 13 pounds since his prior visit in April. He reported that he was walking every morning for 25 minutes. He was also doing a lot of work on his  house.  Also he made a lot of diet changes. For breakfast he was eating  bar. For lunch he eats with his mother and was eating vegetables which were cooked in a healthy manner. For dinner his wife is following a carbohydrate handout in regards to what she was cooking. Prior to that he had been eating a lot at dinnertime and eating late. He has started eating earlier and decreasing his portions. As well he decrease bread intake a lot. At visit 05/2013 his weight was only up 2 pounds compared to her was in July he is maintain this pretty well.  At [place] 03/09/14 he reports he has lost 9 lbs since LOV here 4/15--says the only thing he has changed is that he has stopped eating at night. Says that per his dinner meal he has tried to decrease his portions. Says he was raised that you eat whatever is on your plate. Therefore he is recently decreased the amount that he is putting on his plate !!  Also, not eating any other snacks after dinner.  At his  office visit 4/15 labs revealed A1c 7.2. LDL 126. At that time recommended starting metformin 500 mg twice a day. Also to increase simvastatin from 20 mg to 40 mg. At f/u 02/2014 he reports that he made both of these changes. Says  that he never had any diarrhea or other GI adverse effects with the metformin. He has noticed no symptoms with taking the metformin and the increase simvastatin.  At [place] 11/2014--says that he has lost 6 pounds since his last visit. Make sure he walks some each day. Also is playing golf 9 holes 2 days a week and walks this 9 holes. All tumor his grafts.  He has a grandson who lives nearby. He is 18 years old. He sees him regularly.  His son who lives in [place]  had another baby --born 05/2014.  He also has a 10-year-old grandchild who lives in [place]. They usually come every August. They did come again this August 2015. However, at the visit in April 2016 Ian Leblanc states that this year they are not coming  during the summer. Instead they are  going to come Christmas. This will be the first time in 6 years ago he has been for Christmas so Ian Leblanc's wife is very excited.   In past, has told me  that his son is a Radio producer and his wife can easily take off a high amount of work. Says that when they come here to visit they are able to come and stay a whole month.   06/2013 he went to the emergency room with episode of chest pain.  had a myocardial perfusion scan on 07/23/13 which was normal. 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 Says that the cardiologist told him he had inhaled mold from the leaves and about was the cause of his symptoms.  09/15/2015: He says that his son, daughter-in-law, and children did come for Christmas from [place]. Says that it was absolutely wonderful. Ian Leblanc's mother, who has been a Ian Leblanc here, passed away right before Christmas. However she says that all of this actually worked out. Says that he feels that it was "meant to be" that his son did not come in the summer and came now. Says that the family was able to see his mother prior to her passing. In regards to his mother, he knows that she is in a much better place as she had very poor quality of life for the past few years. He does state that his cervical spine and symptoms down the left arm are "annoying" and he is planning to schedule follow-up visit with Dr. Vivianne Spence he can hear again what his surgical plan would be and "get his head wrapped around it" and start mentally preparing to do surgery.  Says that he is concerned about the risk, but that his symptoms are annoying and that it is very difficult for him to sit still and not do anything--- not help his children when they need him to do something, not play golf etc. Also says that right at Christmas we were booked up so he had to go to an urgent care for respiratory  infection. Says he was treated with a Z-Pak and 2 shots. Says that he still has a lingering cough ever since then it just won't quite go away. No other complaints or concerns. Taking metformin with no adverse effects. Still compliant with diet and exercise. Weight is actually down another few pounds since his last visit. Taking blood pressure medicine as directed with no adverse effects. No lightheadedness. Taking cholesterol medication as directed with no adverse effects. No myalgias.  12/16/2015: Regarding  his grandchildren--says that family that is local had another baby---says his wife loves the baby but he prefers to play with the older child!  Says the son in [place] also has had another one--so their whole family will not be coming--just his son and older child will come next time. (? This summer, I think?) Says he scheduled f/u with Dr. Arnoldo Morale but he was booked out so his appt isn't until August. Says he just has a constant achy, nagging discomfort. Not having any more paresthesias etc down arm or hand. Avoids pulling on things. Is able to play golf without increased symptoms.  Taking all meds as directed. No adv effects.  Reviewed that labs performed at [facility]--09/15/15--LDL was 114--recommended he change Zocor 80 to Lipitor 80mg . He says he did make that change and he is fasting today.    ADDENDUM ADDED 03/02/2016: I received office note from Dr. Earle Gell at [facility]. His note dated 03/02/16. At that visit Ian Leblanc told him that his neck was not bothering him much at this point. Was having no radicular symptoms. Had limited his activity somewhat. Was not taking any pain medications. On exam his strength and reflexes were normal. Was felt that he does not need intervention at this point. Will plan to see him back in 6 months.   04/03/2016; He says they are getting ready to go to the beach. The grandkids that live here are going with them. Their parents will come 2  days later.  The son in [place] decided to wait and come at [place]--so the entire family can come.   He says at night when he sleeps on left side--left side gets stopped up. Rolls onto right side--can breathe.  Blows his nose---it is green mucus.  No chest congestion. No cough. No sore throat.   07/05/2016: He states that he has been sick for the past several days. On Saturday he worked outside in the yard. Says that his hair was soaking wet with sweat. Says that on Sunday he had sweats aches pains chills. Says that Monday night fever was 101. Says he has had some sore throat but thinks that it is secondary to drainage down his throat. Says he has been getting mucus from his nose and has had some cough. He has taken no antipyretics this morning and temperature is reading 98.3 here.  I reviewed that his A1c at last visit 04/03/16 A1c came back 7.6. At that time I recommended him add Actos 30 mg daily. He reports that he did add the Actos and is taking this daily. Having no adverse effects.  Taking metformin and Actos as directed. Being compliant with diet and exercise.  Taking BP meds as directed. No lightheadedness or other adv effects.  Taking Lipitor as directed. No myalgias or other adv effects.   All of his family will be here for Christmas including all of the family in [place] !!  Past Medical History:  Diagnosis Date  . Hyperglycemia   . Hyperlipidemia   . Hypertension   . Sessile colonic polyp      Home Meds:  Outpatient Medications Prior to Visit  Medication Sig Dispense Refill  . aspirin 81 MG tablet Take 81 mg by mouth daily.    Marland Kitchen atorvastatin (LIPITOR) 80 MG tablet Take 1 tablet (80 mg total) by mouth daily at 6 PM. 90 tablet 1  . benazepril (LOTENSIN) 20 MG tablet TAKE ONE TABLET BY MOUTH ONCE DAILY 90 tablet 3  .  hydrochlorothiazide (MICROZIDE) 12.5 MG capsule Take 1 capsule (12.5 mg total) by mouth daily. 90 capsule 0  . hydrochlorothiazide (MICROZIDE) 12.5 MG capsule  TAKE ONE CAPSULE BY MOUTH ONCE DAILY 90 capsule 0  . metFORMIN (GLUCOPHAGE) 1000 MG tablet TAKE ONE TABLET BY MOUTH TWICE DAILY WITH A MEAL 180 tablet 3  . Multiple Vitamins-Minerals (MULTIVITAMIN PO) Take 1 tablet by mouth daily. Over 50 mvi qd    . Omega-3 Fatty Acids (OMEGA-3 FISH OIL PO) Take 600 mg by mouth daily.     . pioglitazone (ACTOS) 30 MG tablet Take 1 tablet (30 mg total) by mouth daily. 30 tablet 3  . amoxicillin-clavulanate (AUGMENTIN) 875-125 MG tablet Take 1 tablet by mouth 2 (two) times daily. (Ian Leblanc not taking: Reported on 07/05/2016) 20 tablet 0  . fluticasone (FLONASE) 50 MCG/ACT nasal spray Place 2 sprays into both nostrils daily. (Ian Leblanc not taking: Reported on 07/05/2016) 16 g 6   No facility-administered medications prior to visit.      Allergies:  Allergies  Allergen Reactions  . Crestor [Rosuvastatin] Other (See Comments)    Severe myalgias    Social History   Social History  . Marital status: Married    Spouse name: N/A  . Number of children: N/A  . Years of education: N/A   Occupational History  . Not on file.   Social History Main Topics  . Smoking status: Former Smoker    Quit date: 07/17/2003  . Smokeless tobacco: Never Used  . Alcohol use No  . Drug use: No  . Sexual activity: Not on file   Other Topics Concern  . Not on file   Social History Narrative  . No narrative on file    No family history on file.   Review of Systems:  See HPI for pertinent ROS. All other ROS negative.    Physical Exam: Blood pressure 140/90, pulse 98, temperature 98.3 F (36.8 C), temperature source Oral, resp. rate 18, height 5\' 10"  (1.778 m), weight 232 lb (105.2 kg), SpO2 97 %., Body mass index is 33.29 kg/m. General: WNWD WM. Appears in no acute distress. HEENT: Ears: Normal. Throat : Normal. Sinuses: No tenderness with percussion.  Neck: Supple. No thyromegaly. No lymphadenopathy. No Carotid bruits Lungs: Clear bilaterally to auscultation  without wheezes, rales, or rhonchi. Breathing is unlabored. Heart: RRR with S1 S2. No murmurs, rubs, or gallops. Abdomen: Soft, non-tender, non-distended with normoactive bowel sounds. No hepatomegaly. No rebound/guarding. No obvious abdominal masses. Musculoskeletal:  Strength and tone normal for age. Extremities/Skin: Warm and dry.  No edema.  Neuro: Alert and oriented X 3. Moves all extremities spontaneously. Gait is normal. CNII-XII grossly in tact. Psych:  Responds to questions appropriately with a normal affect.   Results for orders placed or performed in visit on 07/05/16  STREP GROUP A AG, W/REFLEX TO CULT  Result Value Ref Range   SOURCE THROAT    STREGTOCOCCUS GROUP A AG SCREEN Not Detected   Influenza A and B Ag, Immunoassay  Result Value Ref Range   Source: NASAL    Influenza A Antigen Detected (A) Not Detected   Influenza B Antigen Not Detected Not Detected     ASSESSMENT AND PLAN:  55 y.o. year old male with   Influenza A Discussed Tamiflu but discussed that this is most effective if started within the first 48 hours of symptom onset. At this point he is just going to use over-the-counter medications for symptom relief. Discussed that this is contagious and  to avoid contact with others.   HTN (hypertension) 07/05/2016: His blood pressure  Is well controlled. Cont curent medications.  HLD (hyperlipidemia) 12/09/2014---  increased simvastatin from 40 mg to 80 mg. 03/11/2015--- rechecked FLP and LFTs. LDL was down to 90. 09/15/2015---LDL 114--Changed Zocor 80 to Lipitor 80mg .  12/16/15---Recheck lab on Lipito 80mg  07/05/2016---recheck labs now. Last FLP--LDL was at goal--was 72  DIABETES 11/2013 started Metformin.  Also weight down 9 pounds compared to 4/15 secondary to diet changes at night. 02/2014 A1C went down to 6.1 (from 7.2-- 11/2013) - Hemoglobin A1c -Micro Albumin---12/09/2014  07/05/2016: Diabetes has been controlled. Will recheck following labs to monitor:  A1C, MicroAlbumin, CMET  Gave Pneumovax 23 02/2014.  He is on aspirin 81 mg daily He is on ACE inhibitor He is on statin therapy  Office visit 12/09/2014 discussed diabetic eye exam. Wears glasses so he has an annual on exam. Next exam is due in August. Told him to make sure to inform them that he has diabetes so that they can check for any diabetic changes.   Cervical spine disease Also, he had 2 office visits with me in May 2016 regarding pain in his left shoulder and pain in his left neck. XRay cervical spine did show significant findings. Ian Leblanc stated that he knew Dr. Earle Gell personally and wanted to see him for follow-up. Ian Leblanc tells me that his wife has taken care of Dr. Arnoldo Morale children and takes care of his pets when they are out of town.  Ian Leblanc knows Dr. Arnoldo Morale and his mother who now lives with Dr. Arnoldo Morale.  He says that he has a great man. Says that his children are still not driving age and thinks that Dr. Arnoldo Morale is in his 68s. Says that because of his insurance deductible they decided to wait to do his MRI once the new "calendar year "starts regarding his insurance deductible. Says this restarts in October.  Says that way if he needs surgery, the MRI and the surgery would all be done within one calendar year and he would just pay the deductible that one time. He states that the prednisone I gave helped A LOT. Says that the paresthesias resolved completely. Says that he has just recently started to get a little bit of tingling into his fingers again but that it is very minimal. Today I discussed with him prednisone and the fact that you do not want to use it more frequently then absolutely necessary but at the same time if it is needed for him to call me and we can prescribe another round of this to hold him over until October. See HPI for update--at [facility] 09/15/15 and 12/16/2015 he does plan to schedule follow-up with Dr. Arnoldo Morale to discuss surgery.  ADDENDUM ADDED  03/02/2016: I received office note from Dr. Earle Gell at [facility]. His note dated 03/02/16. At that visit Ian Leblanc told him that his neck was not bothering him much at this point. Was having no radicular symptoms. Had limited his activity somewhat. Was not taking any pain medications. On exam his strength and reflexes were normal. Was felt that he does not need intervention at this point. Will plan to see him back in 6 months.  07/05/2016: Neck pain is stable/controlled  Screening for prostate cancer -- PSA was 06/05/2013              Recheck --12/09/2014              Recheck 12/16/15 07/05/2016--  this was normal 12/16/15. Can wait to recheck.  Personal history of colonic polyps Colonoscopy 06/24/09 with polypectomy. Repeat 3-5 years. He did have repeat colonoscopy between April 2015 - October 2015 . Ian Leblanc states that this showed a very small polyp. Was told to repeat 5 years.  Immunizations: Influenza Vaccine--states that he received this at [facility] 2015. 06/2016 OV--- Bernadene Bell. He will get this in a couple of weeks after his illness has resolved. T. dap 03/2011. Pneumovax 23--Gave here-- 03/09/2014             No Other pneumonia vaccine indicated until age 49 Zostavax--not indicated until age 56. Discuss then.  At office visit 12/09/14 I discussed with Ian Leblanc that if labs remain stable he could wait 6 months for visit. He prefers to be seen every 3 months to closely monitor things. Routine Office visit 3 months. F/U  sooner if needed.   Marin Olp [place], [place], [facility] 07/05/2016 8:29 AM

## 2016-07-06 LAB — HEMOGLOBIN A1C
Hgb A1c MFr Bld: 6.3 % — ABNORMAL HIGH (ref <5.7)
Mean Plasma Glucose: 134 mg/dL

## 2016-07-06 LAB — MICROALBUMIN, URINE: Microalb, Ur: 3.5 mg/dL

## 2016-07-07 LAB — CULTURE, GROUP A STREP: Organism ID, Bacteria: NORMAL

## 2016-07-10 ENCOUNTER — Telehealth: Payer: Self-pay

## 2016-07-10 MED ORDER — AZITHROMYCIN 250 MG PO TABS: ORAL_TABLET | ORAL | 0 refills | Status: DC

## 2016-07-10 NOTE — Telephone Encounter (Signed)
Pt was in office on 11-15 and states he does not feel any better is congested and has stiffness. Pt is req a rx be called in to help with his symptoms

## 2016-07-10 NOTE — Telephone Encounter (Signed)
Rx sent to pharmacy   

## 2016-07-10 NOTE — Telephone Encounter (Signed)
FYI--- at his [facility] patient had positive flu test. However he was out of the 48 hour window for Tamiflu to be effective so this was not prescribed. This was discussed with patient at the [place]. At this point regarding the congestion it is possible that he is developing a secondary bacterial infection so can treat this with: Azithromycin 250 mg--- Day 1: Take 2 daily Days 2-5: Take 1 daily. # 6 (one pack) + 0 Regarding the stiffness: Take otc anti-inflammatories.  Tell him I am sorry this is lasting so long----hopefully should be coming to an end soon.

## 2016-07-25 ENCOUNTER — Telehealth: Payer: Self-pay

## 2016-07-25 NOTE — Telephone Encounter (Signed)
Pt states he has had diarrhea for about two days and would like a rx called in .  Explained to pt we last treated him for flu like symptoms and he has since developed diarrhea and would need to come in and be seen. Pt states it has gotten better and he will give it another day ,told pt if symptoms did not improve to give office call back and we would sch to have him be seen.

## 2016-07-27 ENCOUNTER — Other Ambulatory Visit: Payer: Self-pay | Admitting: Physician Assistant

## 2016-08-23 ENCOUNTER — Other Ambulatory Visit: Payer: Self-pay | Admitting: Physician Assistant

## 2016-08-28 ENCOUNTER — Other Ambulatory Visit: Payer: Self-pay | Admitting: Physician Assistant

## 2016-08-29 NOTE — Telephone Encounter (Signed)
rx filled per protocol  

## 2016-09-05 ENCOUNTER — Other Ambulatory Visit: Payer: Self-pay | Admitting: Physician Assistant

## 2016-10-05 ENCOUNTER — Ambulatory Visit (INDEPENDENT_AMBULATORY_CARE_PROVIDER_SITE_OTHER): Payer: 59 | Admitting: Physician Assistant

## 2016-10-05 ENCOUNTER — Encounter: Payer: Self-pay | Admitting: Physician Assistant

## 2016-10-05 VITALS — BP 132/70 | HR 68 | Temp 98.2°F | Resp 16 | Wt 246.2 lb

## 2016-10-05 DIAGNOSIS — M489 Spondylopathy, unspecified: Secondary | ICD-10-CM | POA: Diagnosis not present

## 2016-10-05 DIAGNOSIS — E119 Type 2 diabetes mellitus without complications: Secondary | ICD-10-CM | POA: Diagnosis not present

## 2016-10-05 DIAGNOSIS — E785 Hyperlipidemia, unspecified: Secondary | ICD-10-CM

## 2016-10-05 DIAGNOSIS — I10 Essential (primary) hypertension: Secondary | ICD-10-CM | POA: Diagnosis not present

## 2016-10-05 LAB — HEMOGLOBIN A1C, FINGERSTICK: Hgb A1C (fingerstick): 6.4 % — ABNORMAL HIGH (ref <5.7)

## 2016-10-05 NOTE — Progress Notes (Addendum)
Patient ID: Ian Leblanc MRN: [id number], DOB: 31-Oct-1960, 56 y.o. Date of Encounter: @DATE @  Chief Complaint:  Chief Complaint  Patient presents with  . Hypertension  . Diabetes    f/u    HPI: 56 y.o. year old white male  presents for routine followup office visit.  Also, he had 2 office visits with me in May 2016 regarding pain in his left shoulder and pain in his left neck. XRay cervical spine did show significant findings. Patient stated that he knew Dr. Earle Gell personally and wanted to see him for follow-up. Patient tells me that his wife has taken care of Dr. Arnoldo Morale children and takes care of his pets when they are out of town.  Patient knows Dr. Arnoldo Morale and his mother who now lives with Dr. Arnoldo Morale.  He says that he has a great man. Says that his children are still not driving age and thinks that Dr. Arnoldo Morale is in his 73s. Says that because of his insurance deductible they decided to wait to do his MRI once the new "calendar year "starts regarding his insurance deductible. Says this restarts in October.  Says that way if he needs surgery, the MRI and the surgery would all be done within one calendar year and he would just pay the deductible that one time. He states that the prednisone I gave helped A LOT. Says that the paresthesias resolved completely. Says that he has just recently started to get a little bit of tingling into his fingers again but that it is very minimal. Today I discussed with him prednisone and the fact that you do not want to use it more frequently then absolutely necessary but at the same time if it is needed for him to call me and we can prescribe another round of this to hold him over until October.    At his  office visit with me on 03/05/13 he had lost 13 pounds since his prior visit in April. He reported that he was walking every morning for 25 minutes. He was also doing a lot of work on his house.  Also he made a lot of diet changes. For breakfast  he was eating  bar. For lunch he eats with his mother and was eating vegetables which were cooked in a healthy manner. For dinner his wife is following a carbohydrate handout in regards to what she was cooking. Prior to that he had been eating a lot at dinnertime and eating late. He has started eating earlier and decreasing his portions. As well he decrease bread intake a lot. At visit 05/2013 his weight was only up 2 pounds compared to her was in July he is maintain this pretty well.  At [place] 03/09/14 he reports he has lost 9 lbs since LOV here 4/15--says the only thing he has changed is that he has stopped eating at night. Says that per his dinner meal he has tried to decrease his portions. Says he was raised that you eat whatever is on your plate. Therefore he is recently decreased the amount that he is putting on his plate !!  Also, not eating any other snacks after dinner.  At his  office visit 4/15 labs revealed A1c 7.2. LDL 126. At that time recommended starting metformin 500 mg twice a day. Also to increase simvastatin from 20 mg to 40 mg. At f/u 02/2014 he reports that he made both of these changes. Says that he never had any diarrhea or other GI adverse  effects with the metformin. He has noticed no symptoms with taking the metformin and the increase simvastatin.  At [place] 11/2014--says that he has lost 6 pounds since his last visit. Make sure he walks some each day. Also is playing golf 9 holes 2 days a week and walks this 9 holes. All tumor his grafts.  He has a grandson who lives nearby. He is 58 years old. He sees him regularly.  His son who lives in [place]  had another baby --born 05/2014.  He also has a 48-year-old grandchild who lives in [place]. They usually come every August. They did come again this August 2015. However, at the visit in April 2016 patient states that this year they are not coming  during the summer. Instead they are going to come Christmas. This will be the first time in 6  years ago he has been for Christmas so patient's wife is very excited.   In past, has told me  that his son is a Radio producer and his wife can easily take off a high amount of work. Says that when they come here to visit they are able to come and stay a whole month.   06/2013 he went to the emergency room with episode of chest pain.  had a myocardial perfusion scan on 07/23/13 which was normal. 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 Says that the cardiologist told him he had inhaled mold from the leaves and about was the cause of his symptoms.  09/15/2015: He says that his son, daughter-in-law, and children did come for Christmas from [place]. Says that it was absolutely wonderful. Patient's mother, who has been a patient here, passed away right before Christmas. However she says that all of this actually worked out. Says that he feels that it was "meant to be" that his son did not come in the summer and came now. Says that the family was able to see his mother prior to her passing. In regards to his mother, he knows that she is in a much better place as she had very poor quality of life for the past few years. He does state that his cervical spine and symptoms down the left arm are "annoying" and he is planning to schedule follow-up visit with Dr. Vivianne Spence he can hear again what his surgical plan would be and "get his head wrapped around it" and start mentally preparing to do surgery.  Says that he is concerned about the risk, but that his symptoms are annoying and that it is very difficult for him to sit still and not do anything--- not help his children when they need him to do something, not play golf etc. Also says that right at Christmas we were booked up so he had to go to an urgent care for respiratory infection. Says he was treated with a Z-Pak and 2 shots. Says  that he still has a lingering cough ever since then it just won't quite go away. No other complaints or concerns. Taking metformin with no adverse effects. Still compliant with diet and exercise. Weight is actually down another few pounds since his last visit. Taking blood pressure medicine as directed with no adverse effects. No lightheadedness. Taking cholesterol medication as directed with no adverse effects. No myalgias.  12/16/2015: Regarding his grandchildren--says that family that is local had another baby---says  his wife loves the baby but he prefers to play with the older child!  Says the son in [place] also has had another one--so their whole family will not be coming--just his son and older child will come next time. (? This summer, I think?) Says he scheduled f/u with Dr. Arnoldo Morale but he was booked out so his appt isn't until August. Says he just has a constant achy, nagging discomfort. Not having any more paresthesias etc down arm or hand. Avoids pulling on things. Is able to play golf without increased symptoms.  Taking all meds as directed. No adv effects.  Reviewed that labs performed at [facility]--09/15/15--LDL was 114--recommended he change Zocor 80 to Lipitor 80mg . He says he did make that change and he is fasting today.    ADDENDUM ADDED 03/02/2016: I received office note from Dr. Earle Gell at [facility]. His note dated 03/02/16. At that visit Mr. Deadmon told him that his neck was not bothering him much at this point. Was having no radicular symptoms. Had limited his activity somewhat. Was not taking any pain medications. On exam his strength and reflexes were normal. Was felt that he does not need intervention at this point. Will plan to see him back in 6 months.   04/03/2016; He says they are getting ready to go to the beach. The grandkids that live here are going with them. Their parents will come 2 days later.  The son in [place] decided to wait and come at  [place]--so the entire family can come.   He says at night when he sleeps on left side--left side gets stopped up. Rolls onto right side--can breathe.  Blows his nose---it is green mucus.  No chest congestion. No cough. No sore throat.   07/05/2016: He states that he has been sick for the past several days. On Saturday he worked outside in the yard. Says that his hair was soaking wet with sweat. Says that on Sunday he had sweats aches pains chills. Says that Monday night fever was 101. Says he has had some sore throat but thinks that it is secondary to drainage down his throat. Says he has been getting mucus from his nose and has had some cough. He has taken no antipyretics this morning and temperature is reading 98.3 here.  I reviewed that his A1c at last visit 04/03/16 A1c came back 7.6. At that time I recommended him add Actos 30 mg daily. He reports that he did add the Actos and is taking this daily. Having no adverse effects.  Taking metformin and Actos as directed. Being compliant with diet and exercise.  Taking BP meds as directed. No lightheadedness or other adv effects.  Taking Lipitor as directed. No myalgias or other adv effects.   All of his family will be here for Christmas including all of the family in [place] !!   10/05/2016: He states that the family in [place] ended up not being able to come for Christmas. They had just bought a house and so it didn't work out. No other updates today. He has been feeling well and has no concerns to address today. Taking metformin and Actos as directed. Being compliant with diet and exercise.  Taking BP meds as directed. No lightheadedness or other adv effects.  Taking Lipitor as directed. No myalgias or other adv effects.    10/09/2016--Addendum Added--- Received [facility] note from Dr. Earle Gell dated 10/06/2016: "At this point he is neurologically normal and his symptoms are  improving.  Will take a wait and see approach. I'll see him back in a  year to check on his progress. He knows to call sooner if has any more problems. "   Past Medical History:  Diagnosis Date  . Hyperglycemia   . Hyperlipidemia   . Hypertension   . Sessile colonic polyp      Home Meds:  Outpatient Medications Prior to Visit  Medication Sig Dispense Refill  . aspirin 81 MG tablet Take 81 mg by mouth daily.    Marland Kitchen atorvastatin (LIPITOR) 80 MG tablet TAKE ONE TABLET BY MOUTH  DAILY AT 6 PM 90 tablet 1  . benazepril (LOTENSIN) 20 MG tablet TAKE ONE TABLET BY MOUTH ONCE DAILY 90 tablet 3  . hydrochlorothiazide (MICROZIDE) 12.5 MG capsule Take 1 capsule (12.5 mg total) by mouth daily. 90 capsule 0  . hydrochlorothiazide (MICROZIDE) 12.5 MG capsule TAKE ONE CAPSULE BY MOUTH ONCE DAILY 90 capsule 0  . metFORMIN (GLUCOPHAGE) 1000 MG tablet TAKE ONE TABLET BY MOUTH TWICE DAILY WITH A MEAL 180 tablet 3  . Multiple Vitamins-Minerals (MULTIVITAMIN PO) Take 1 tablet by mouth daily. Over 50 mvi qd    . Omega-3 Fatty Acids (OMEGA-3 FISH OIL PO) Take 600 mg by mouth daily.     . pioglitazone (ACTOS) 30 MG tablet TAKE ONE TABLET BY MOUTH ONCE DAILY 30 tablet 3  . amoxicillin-clavulanate (AUGMENTIN) 875-125 MG tablet Take 1 tablet by mouth 2 (two) times daily. (Patient not taking: Reported on 07/05/2016) 20 tablet 0  . azithromycin (ZITHROMAX) 250 MG tablet Day 1 take 2 daily, Days 2-5 take 1 (Patient not taking: Reported on 10/05/2016) 6 tablet 0  . fluticasone (FLONASE) 50 MCG/ACT nasal spray Place 2 sprays into both nostrils daily. (Patient not taking: Reported on 07/05/2016) 16 g 6   No facility-administered medications prior to visit.      Allergies:  Allergies  Allergen Reactions  . Crestor [Rosuvastatin] Other (See Comments)    Severe myalgias    Social History   Social History  . Marital status: Married    Spouse name: N/A  . Number of children: N/A  . Years of education: N/A   Occupational History  . Not on file.   Social History Main Topics  .  Smoking status: Former Smoker    Quit date: 07/17/2003  . Smokeless tobacco: Never Used  . Alcohol use No  . Drug use: No  . Sexual activity: Not on file   Other Topics Concern  . Not on file   Social History Narrative  . No narrative on file    No family history on file.   Review of Systems:  See HPI for pertinent ROS. All other ROS negative.    Physical Exam: Blood pressure 132/70, pulse 68, temperature 98.2 F (36.8 C), temperature source Oral, resp. rate 16, weight 246 lb 3.2 oz (111.7 kg), SpO2 98 %., Body mass index is 35.33 kg/m. General: WNWD WM. Appears in no acute distress. Neck: Supple. No thyromegaly. No lymphadenopathy. No Carotid bruits Lungs: Clear bilaterally to auscultation without wheezes, rales, or rhonchi. Breathing is unlabored. Heart: RRR with S1 S2. No murmurs, rubs, or gallops. Abdomen: Soft, non-tender, non-distended with normoactive bowel sounds. No hepatomegaly. No rebound/guarding. No obvious abdominal masses. Musculoskeletal:  Strength and tone normal for age. Extremities/Skin: Warm and dry.  No edema.  Diabetic Foot Exam: Inspection is normal. He has no calluses or other problematic areas. No wounds. Feet look very good. 2+ dorsalis pedis  and posterior tibial pulses bilaterally. Neuro: Alert and oriented X 3. Moves all extremities spontaneously. Gait is normal. CNII-XII grossly in tact. Psych:  Responds to questions appropriately with a normal affect.      ASSESSMENT AND PLAN:  56 y.o. year old male with     HTN (hypertension) 10/05/2016: His blood pressure  Is well controlled. Cont curent medications.  HLD (hyperlipidemia) 12/09/2014---  increased simvastatin from 40 mg to 80 mg. 03/11/2015--- rechecked FLP and LFTs. LDL was down to 90. 09/15/2015---LDL 114--Changed Zocor 80 to Lipitor 80mg .  12/16/15---Recheck lab on Lipito 80mg  07/05/2016---recheck labs now. Last FLP--LDL was at goal--was 72 10/05/2016-  he had labs at last visit. Continue  current statin.  DIABETES 11/2013 started Metformin.  Also weight down 9 pounds compared to 4/15 secondary to diet changes at night. 02/2014 A1C went down to 6.1 (from 7.2-- 11/2013) - Hemoglobin A1c -Micro Albumin---12/09/2014  07/05/2016: Diabetes has been controlled. Will recheck following labs to monitor: A1C, MicroAlbumin, CMET  Gave Pneumovax 23 02/2014.  He is on aspirin 81 mg daily He is on ACE inhibitor He is on statin therapy  Office visit 12/09/2014 discussed diabetic eye exam. Wears glasses so he has an annual on exam. Next exam is due in August. Told him to make sure to inform them that he has diabetes so that they can check for any diabetic changes.  10/05/2016--- Check A1c now.  Cervical spine disease Also, he had 2 office visits with me in May 2016 regarding pain in his left shoulder and pain in his left neck. XRay cervical spine did show significant findings. Patient stated that he knew Dr. Earle Gell personally and wanted to see him for follow-up. Patient tells me that his wife has taken care of Dr. Arnoldo Morale children and takes care of his pets when they are out of town.  Patient knows Dr. Arnoldo Morale and his mother who now lives with Dr. Arnoldo Morale.  He says that he has a great man. Says that his children are still not driving age and thinks that Dr. Arnoldo Morale is in his 7s. Says that because of his insurance deductible they decided to wait to do his MRI once the new "calendar year "starts regarding his insurance deductible. Says this restarts in October.  Says that way if he needs surgery, the MRI and the surgery would all be done within one calendar year and he would just pay the deductible that one time. He states that the prednisone I gave helped A LOT. Says that the paresthesias resolved completely. Says that he has just recently started to get a little bit of tingling into his fingers again but that it is very minimal. Today I discussed with him prednisone and the fact that you  do not want to use it more frequently then absolutely necessary but at the same time if it is needed for him to call me and we can prescribe another round of this to hold him over until October. See HPI for update--at [facility] 09/15/15 and 12/16/2015 he does plan to schedule follow-up with Dr. Arnoldo Morale to discuss surgery.  ADDENDUM ADDED 03/02/2016: I received office note from Dr. Earle Gell at [facility]. His note dated 03/02/16. At that visit Mr. Alcindor told him that his neck was not bothering him much at this point. Was having no radicular symptoms. Had limited his activity somewhat. Was not taking any pain medications. On exam his strength and reflexes were normal. Was felt that he does not need  intervention at this point. Will plan to see him back in 6 months. 10/09/2016--Addendum Added--- Received OV note from Dr. Earle Gell dated 10/06/2016: "At this point he is neurologically normal and his symptoms are improving.  Will take a wait and see approach. I'll see him back in a year to check on his progress. He knows to call sooner if has any more problems. "  07/05/2016: Neck pain is stable/controlled 10/05/2016---This is stable  Screening for prostate cancer -- PSA was 06/05/2013              Recheck --12/09/2014              Recheck 12/16/15 10/05/2016-- this was normal 12/16/15. Can wait to recheck.  Personal history of colonic polyps Colonoscopy 06/24/09 with polypectomy. Repeat 3-5 years. He did have repeat colonoscopy between April 2015 - October 2015 . Patient states that this showed a very small polyp. Was told to repeat 5 years.  Immunizations: Influenza Vaccine-- T. dap 03/2011. Pneumovax 23--Gave here-- 03/09/2014             No Other pneumonia vaccine indicated until age 58 Zostavax--not indicated until age 72. Discuss then.  At office visit 12/09/14 I discussed with patient that if labs remain stable he could wait 6 months for visit. He prefers to be seen every 3  months to closely monitor things. Routine Office visit 3 months. F/U  sooner if needed.   Marin Olp [place], [place], [facility] 10/05/2016 8:18 AM

## 2016-10-06 DIAGNOSIS — M4722 Other spondylosis with radiculopathy, cervical region: Secondary | ICD-10-CM | POA: Diagnosis not present

## 2017-01-01 ENCOUNTER — Ambulatory Visit (INDEPENDENT_AMBULATORY_CARE_PROVIDER_SITE_OTHER): Payer: 59 | Admitting: Physician Assistant

## 2017-01-01 ENCOUNTER — Encounter: Payer: Self-pay | Admitting: Physician Assistant

## 2017-01-01 VITALS — BP 130/86 | HR 80 | Temp 98.1°F | Resp 16 | Wt 236.2 lb

## 2017-01-01 DIAGNOSIS — J301 Allergic rhinitis due to pollen: Secondary | ICD-10-CM

## 2017-01-01 MED ORDER — METHYLPREDNISOLONE ACETATE 80 MG/ML IJ SUSP
80.0000 mg | Freq: Once | INTRAMUSCULAR | Status: AC
  Administered 2017-01-01: 80 mg via INTRAMUSCULAR

## 2017-01-01 NOTE — Addendum Note (Signed)
Addended by: Vonna Kotyk A on: 01/01/2017 11:38 AM   Modules accepted: Orders

## 2017-01-01 NOTE — Progress Notes (Signed)
Patient ID: Ian Leblanc MRN: [id number], DOB: Mar 07, 1961, 56 y.o. Date of Encounter: 01/01/2017, 11:19 AM    Chief Complaint:  Chief Complaint  Patient presents with  . Headache    x3days  . head congestion     HPI: 56 y.o. year old male presents with above.   He says that his head feels all stopped up and hurts. Says that it "has even gotten to where I have to wear a mask when I mowe ". Says that what he is getting out of his nose is watery clear. Feels like he is constantly snorting because feels like his nose is draining. Has had no chest congestion or cough. No fevers or chills. No sore throat.     Home Meds:   Outpatient Medications Prior to Visit  Medication Sig Dispense Refill  . aspirin 81 MG tablet Take 81 mg by mouth daily.    Marland Kitchen atorvastatin (LIPITOR) 80 MG tablet TAKE ONE TABLET BY MOUTH  DAILY AT 6 PM 90 tablet 1  . benazepril (LOTENSIN) 20 MG tablet TAKE ONE TABLET BY MOUTH ONCE DAILY 90 tablet 3  . fluticasone (FLONASE) 50 MCG/ACT nasal spray Place 2 sprays into both nostrils daily. 16 g 6  . hydrochlorothiazide (MICROZIDE) 12.5 MG capsule Take 1 capsule (12.5 mg total) by mouth daily. 90 capsule 0  . metFORMIN (GLUCOPHAGE) 1000 MG tablet TAKE ONE TABLET BY MOUTH TWICE DAILY WITH A MEAL 180 tablet 3  . Multiple Vitamins-Minerals (MULTIVITAMIN PO) Take 1 tablet by mouth daily. Over 50 mvi qd    . Omega-3 Fatty Acids (OMEGA-3 FISH OIL PO) Take 600 mg by mouth daily.     . pioglitazone (ACTOS) 30 MG tablet TAKE ONE TABLET BY MOUTH ONCE DAILY 30 tablet 3  . amoxicillin-clavulanate (AUGMENTIN) 875-125 MG tablet Take 1 tablet by mouth 2 (two) times daily. (Patient not taking: Reported on 07/05/2016) 20 tablet 0  . azithromycin (ZITHROMAX) 250 MG tablet Day 1 take 2 daily, Days 2-5 take 1 (Patient not taking: Reported on 10/05/2016) 6 tablet 0  . hydrochlorothiazide (MICROZIDE) 12.5 MG capsule TAKE ONE CAPSULE BY MOUTH ONCE DAILY 90 capsule 0   No  facility-administered medications prior to visit.     Allergies:  Allergies  Allergen Reactions  . Crestor [Rosuvastatin] Other (See Comments)    Severe myalgias      Review of Systems: See HPI for pertinent ROS. All other ROS negative.    Physical Exam: Blood pressure 130/86, pulse 80, temperature 98.1 F (36.7 C), temperature source Oral, resp. rate 16, weight 236 lb 3.2 oz (107.1 kg), SpO2 98 %., Body mass index is 33.89 kg/m. General:  WNWD WM. Appears in no acute distress. HEENT: Normocephalic, atraumatic, eyes without discharge, sclera non-icteric, nares are without discharge. Bilateral auditory canals clear, TM's are without perforation, pearly grey and translucent with reflective cone of light bilaterally. Oral cavity moist, posterior pharynx without exudate, erythema, peritonsillar abscess. Positive tenderness with percussion to frontal and maxillary sinuses bilaterally.  Neck: Supple. No thyromegaly. No lymphadenopathy. Lungs: Clear bilaterally to auscultation without wheezes, rales, or rhonchi. Breathing is unlabored. Heart: Regular rhythm. No murmurs, rubs, or gallops. Msk:  Strength and tone normal for age. Extremities/Skin: Warm and dry. Neuro: Alert and oriented X 3. Moves all extremities spontaneously. Gait is normal. CNII-XII grossly in tact. Psych:  Responds to questions appropriately with a normal affect.     ASSESSMENT AND PLAN:  56 y.o. year old male with  1. Seasonal allergic  rhinitis due to pollen Discussed injection of prednisone versus oral prednisone. He prefers injection. Will give Depo-Medrol 80 mg IM now. Discussed that this will make his blood sugars high and to be extra careful with carb intake this week. Also recommend taking Zyrtec 10 mg daily until allergy symptoms resolve. Follow up if starts getting out any thick dark mucus or develops any fever or if symptoms do not resolve with above treatment.   Signed, [address] [place], [place],  [facility] 01/01/2017 11:19 AM

## 2017-01-04 ENCOUNTER — Ambulatory Visit: Payer: 59 | Admitting: Physician Assistant

## 2017-01-05 ENCOUNTER — Other Ambulatory Visit: Payer: Self-pay | Admitting: Physician Assistant

## 2017-01-08 NOTE — Telephone Encounter (Signed)
Refill appropriate 

## 2017-01-11 ENCOUNTER — Other Ambulatory Visit: Payer: Self-pay | Admitting: Physician Assistant

## 2017-01-11 NOTE — Telephone Encounter (Signed)
Refill appropriate 

## 2017-02-03 DIAGNOSIS — H40033 Anatomical narrow angle, bilateral: Secondary | ICD-10-CM | POA: Diagnosis not present

## 2017-02-03 DIAGNOSIS — E119 Type 2 diabetes mellitus without complications: Secondary | ICD-10-CM | POA: Diagnosis not present

## 2017-02-19 ENCOUNTER — Other Ambulatory Visit: Payer: Self-pay | Admitting: Physician Assistant

## 2017-02-20 NOTE — Telephone Encounter (Signed)
Refill appropriate 

## 2017-03-12 ENCOUNTER — Other Ambulatory Visit: Payer: Self-pay | Admitting: Physician Assistant

## 2017-04-18 ENCOUNTER — Other Ambulatory Visit: Payer: Self-pay | Admitting: Physician Assistant

## 2017-05-15 DIAGNOSIS — Z23 Encounter for immunization: Secondary | ICD-10-CM | POA: Diagnosis not present

## 2017-05-21 ENCOUNTER — Other Ambulatory Visit: Payer: Self-pay | Admitting: Physician Assistant

## 2017-05-22 NOTE — Telephone Encounter (Signed)
Refill appropriate and filled per protocol. 

## 2017-05-23 ENCOUNTER — Ambulatory Visit (INDEPENDENT_AMBULATORY_CARE_PROVIDER_SITE_OTHER): Payer: 59 | Admitting: Physician Assistant

## 2017-05-23 ENCOUNTER — Encounter: Payer: Self-pay | Admitting: Physician Assistant

## 2017-05-23 VITALS — BP 138/82 | HR 66 | Temp 97.8°F | Resp 16 | Ht 70.0 in | Wt 246.0 lb

## 2017-05-23 DIAGNOSIS — R0981 Nasal congestion: Secondary | ICD-10-CM

## 2017-05-23 DIAGNOSIS — Z86010 Personal history of colonic polyps: Secondary | ICD-10-CM | POA: Diagnosis not present

## 2017-05-23 DIAGNOSIS — Z1159 Encounter for screening for other viral diseases: Secondary | ICD-10-CM | POA: Diagnosis not present

## 2017-05-23 DIAGNOSIS — J988 Other specified respiratory disorders: Secondary | ICD-10-CM

## 2017-05-23 DIAGNOSIS — Z114 Encounter for screening for human immunodeficiency virus [HIV]: Secondary | ICD-10-CM

## 2017-05-23 DIAGNOSIS — Z125 Encounter for screening for malignant neoplasm of prostate: Secondary | ICD-10-CM | POA: Diagnosis not present

## 2017-05-23 DIAGNOSIS — E785 Hyperlipidemia, unspecified: Secondary | ICD-10-CM

## 2017-05-23 DIAGNOSIS — M489 Spondylopathy, unspecified: Secondary | ICD-10-CM | POA: Diagnosis not present

## 2017-05-23 DIAGNOSIS — E119 Type 2 diabetes mellitus without complications: Secondary | ICD-10-CM | POA: Diagnosis not present

## 2017-05-23 DIAGNOSIS — I10 Essential (primary) hypertension: Secondary | ICD-10-CM

## 2017-05-23 DIAGNOSIS — B9689 Other specified bacterial agents as the cause of diseases classified elsewhere: Secondary | ICD-10-CM

## 2017-05-23 MED ORDER — FLUTICASONE PROPIONATE 50 MCG/ACT NA SUSP: 2.0000 | Freq: Every day | NASAL | 6 refills | Status: DC

## 2017-05-23 MED ORDER — PREDNISONE 20 MG PO TABS: ORAL_TABLET | ORAL | 0 refills | Status: DC

## 2017-05-23 NOTE — Progress Notes (Signed)
Patient ID: Ian Leblanc MRN: [id number], DOB: 19-Mar-1961, 56 y.o. Date of Encounter: @DATE @  Chief Complaint:  Chief Complaint  Patient presents with  . Follow-up    is fasting    HPI: 56 y.o. year old white male  presents for routine followup office visit.   09/15/2015: He says that his son, daughter-in-law, and children did come for Christmas from [place]. Says that it was absolutely wonderful. Patient's mother, who has been a patient here, passed away right before Christmas. However she says that all of this actually worked out. Says that he feels that it was "meant to be" that his son did not come in the summer and came now. Says that the family was able to see his mother prior to her passing. In regards to his mother, he knows that she is in a much better place as she had very poor quality of life for the past few years. He does state that his cervical spine and symptoms down the left arm are "annoying" and he is planning to schedule follow-up visit with Dr. Vivianne Leblanc he can hear again what his surgical plan would be and "get his head wrapped around it" and start mentally preparing to do surgery.  Says that he is concerned about the risk, but that his symptoms are annoying and that it is very difficult for him to sit still and not do anything--- not help his children when they need him to do something, not play golf etc. Also says that right at Christmas we were booked up so he had to go to an urgent care for respiratory infection. Says he was treated with a Z-Pak and 2 shots. Says that he still has a lingering cough ever since then it just won't quite go away. No other complaints or concerns. Taking metformin with no adverse effects. Still compliant with diet and exercise. Weight is actually down another few pounds since his last visit. Taking blood pressure medicine as directed with no adverse effects. No lightheadedness. Taking cholesterol medication as directed with no adverse  effects. No myalgias.  12/16/2015: Regarding his grandchildren--says that family that is local had another baby---says his wife loves the baby but he prefers to play with the older child!  Says the son in [place] also has had another one--so their whole family will not be coming--just his son and older child will come next time. (? This summer, I think?) Says he scheduled f/u with Dr. Arnoldo Leblanc but he was booked out so his appt isn't until August. Says he just has a constant achy, nagging discomfort. Not having any more paresthesias etc down arm or hand. Avoids pulling on things. Is able to play golf without increased symptoms.  Taking all meds as directed. No adv effects.  Reviewed that labs performed at [facility]--09/15/15--LDL was 114--recommended he change Zocor 80 to Lipitor 80mg . He says he did make that change and he is fasting today.    ADDENDUM ADDED 03/02/2016: I received office note from Dr. Earle Leblanc at [facility]. His note dated 03/02/16. At that visit Ian Leblanc told him that his neck was not bothering him much at this point. Was having no radicular symptoms. Had limited his activity somewhat. Was not taking any pain medications. On exam his strength and reflexes were normal. Was felt that he does not need intervention at this point. Will plan to see him back in 6 months.   04/03/2016; He says they are getting ready to go to the beach.  The grandkids that live here are going with them. Their parents will come 2 days later.  The son in [place] decided to wait and come at [place]--so the entire family can come.   He says at night when he sleeps on left side--left side gets stopped up. Rolls onto right side--can breathe.  Blows his nose---it is green mucus.  No chest congestion. No cough. No sore throat.   07/05/2016: He states that he has been sick for the past several days. On Saturday he worked outside in the yard. Says that his hair was soaking wet with sweat. Says  that on Sunday he had sweats aches pains chills. Says that Monday night fever was 101. Says he has had some sore throat but thinks that it is secondary to drainage down his throat. Says he has been getting mucus from his nose and has had some cough. He has taken no antipyretics this morning and temperature is reading 98.3 here.  I reviewed that his A1c at last visit 04/03/16 A1c came back 7.6. At that time I recommended him add Actos 30 mg daily. He reports that he did add the Actos and is taking this daily. Having no adverse effects.  Taking metformin and Actos as directed. Being compliant with diet and exercise.  Taking BP meds as directed. No lightheadedness or other adv effects.  Taking Lipitor as directed. No myalgias or other adv effects.   All of his family will be here for Christmas including all of the family in [place] !!   10/05/2016: He states that the family in [place] ended up not being able to come for Christmas. They had just bought a house and so it didn't work out. No other updates today. He has been feeling well and has no concerns to address today. Taking metformin and Actos as directed. Being compliant with diet and exercise.  Taking BP meds as directed. No lightheadedness or other adv effects.  Taking Lipitor as directed. No myalgias or other adv effects.    10/09/2016--Addendum Added--- Received [facility] note from Dr. Earle Leblanc dated 10/06/2016: "At this point he is neurologically normal and his symptoms are improving.  Will take a wait and see approach. I'll see him back in a year to check on his progress. He knows to call sooner if has any more problems. "   05/23/2017: He reports that his son's family in [place]---is planning to come in for Christmas. He reports that the grandchildren who live close by--one of them is 58 years old the other is 72 months old. He says that the one who is  " 51 months old is very quick and if you turn for 1 second he is gone" Today he reports that  he has been having some sinus congestion and just very mild slight headache. Says that if he sleeps on his left side it is clogged so has to sleep on the right side. Is not blowing anything out of his nose. Is using his Flonase but still having symptoms. No sore throat. No chest congestion. No fevers or chills. Taking metformin and Actos as directed. Being compliant with diet and exercise.  Taking BP meds as directed. No lightheadedness or other adv effects.  Taking Lipitor as directed. No myalgias or other adv effects.  He does walk 15-20 minutes every morning. He is continuing to do pretty good in regards to his diet. Symptoms related to his neck are still stable. Still does not need to proceed with surgery at  this time. States that he just had an eye exam 3 or 4 months ago. Also just got flu shot at [facility] last week.     Past Medical History:  Diagnosis Date  . Hyperglycemia   . Hyperlipidemia   . Hypertension   . Sessile colonic polyp      Home Meds:  Outpatient Medications Prior to Visit  Medication Sig Dispense Refill  . aspirin 81 MG tablet Take 81 mg by mouth daily.    Marland Kitchen atorvastatin (LIPITOR) 80 MG tablet TAKE ONE TABLET BY MOUTH ONCE DAILY AT  6PM 90 tablet 1  . benazepril (LOTENSIN) 20 MG tablet TAKE ONE TABLET BY MOUTH ONCE DAILY 90 tablet 0  . fluticasone (FLONASE) 50 MCG/ACT nasal spray Place 2 sprays into both nostrils daily. 16 g 6  . hydrochlorothiazide (MICROZIDE) 12.5 MG capsule Take 1 capsule (12.5 mg total) by mouth daily. 90 capsule 0  . hydrochlorothiazide (MICROZIDE) 12.5 MG capsule TAKE 1 CAPSULE BY MOUTH ONCE DAILY 90 capsule 0  . metFORMIN (GLUCOPHAGE) 1000 MG tablet TAKE ONE TABLET BY MOUTH TWICE DAILY WITH MEALS 180 tablet 3  . Multiple Vitamins-Minerals (MULTIVITAMIN PO) Take 1 tablet by mouth daily. Over 50 mvi qd    . Omega-3 Fatty Acids (OMEGA-3 FISH OIL PO) Take 600 mg by mouth daily.     . pioglitazone (ACTOS) 30 MG tablet TAKE 1 TABLET BY MOUTH ONCE  DAILY 90 tablet 3   No facility-administered medications prior to visit.      Allergies:  Allergies  Allergen Reactions  . Crestor [Rosuvastatin] Other (See Comments)    Severe myalgias    Social History   Social History  . Marital status: Married    Spouse name: N/A  . Number of children: N/A  . Years of education: N/A   Occupational History  . Not on file.   Social History Main Topics  . Smoking status: Former Smoker    Quit date: 07/17/2003  . Smokeless tobacco: Never Used  . Alcohol use No  . Drug use: No  . Sexual activity: Not on file   Other Topics Concern  . Not on file   Social History Narrative  . No narrative on file    History reviewed. No pertinent family history.   Review of Systems:  See HPI for pertinent ROS. All other ROS negative.    Physical Exam: Blood pressure 138/82, pulse 66, temperature 97.8 F (36.6 C), temperature source Oral, resp. rate 16, height 5\' 10"  (1.778 m), weight 111.6 kg (246 lb), SpO2 97 %., Body mass index is 35.3 kg/m. General: WNWD WM. Appears in no acute distress. Neck: Supple. No thyromegaly. No lymphadenopathy. No Carotid bruits Lungs: Clear bilaterally to auscultation without wheezes, rales, or rhonchi. Breathing is unlabored. Heart: RRR with S1 S2. No murmurs, rubs, or gallops. Abdomen: Soft, non-tender, non-distended with normoactive bowel sounds. No hepatomegaly. No rebound/guarding. No obvious abdominal masses. Musculoskeletal:  Strength and tone normal for age. Extremities/Skin: Warm and dry.  No edema.  Diabetic Foot Exam: Inspection is normal. He has no calluses or other problematic areas. No wounds. Feet look very good. 2+ dorsalis pedis and posterior tibial pulses bilaterally. Neuro: Alert and oriented X 3. Moves all extremities spontaneously. Gait is normal. CNII-XII grossly in tact. Psych:  Responds to questions appropriately with a normal affect.      ASSESSMENT AND PLAN:  56 y.o. year old male  with   Screening for HIV (human immunodeficiency virus) 05/23/2017 - HIV antibody  Need for hepatitis C screening test 05/23/2017  - Hepatitis C antibody  Nasal congestion 05/23/2017 Cont Flonase. Add Prednisone taper. If symptoms do not resolve/controlled with this in follow-up. - predniSONE (DELTASONE) 20 MG tablet; Take 3 daily for 2 days, then 2 daily for 2 days, then 1 daily for 2 days.  Dispense: 12 tablet; Refill: 0   HTN (hypertension) 05/23/2017: His blood pressure  Is well controlled. Cont curent medications.  HLD (hyperlipidemia) 12/09/2014---  increased simvastatin from 40 mg to 80 mg. 03/11/2015--- rechecked FLP and LFTs. LDL was down to 90. 09/15/2015---LDL 114--Changed Zocor 80 to Lipitor 80mg .  12/16/15---Recheck lab on Lipito 80mg  07/05/2016---recheck labs now. Last FLP--LDL was at goal--was 72 10/05/2016-  he had labs at last visit. Continue current statin. 05/23/17: He is on statin. He is fasting. Recheck FLP, LFT.  DIABETES 11/2013 started Metformin.  Also weight down 9 pounds compared to 4/15 secondary to diet changes at night. 02/2014 A1C went down to 6.1 (from 7.2-- 11/2013) - Hemoglobin A1c -Micro Albumin---07/05/2016  05/23/2017: Diabetes has been controlled. Will recheck following labs to monitor: A1C,  CMET  Gave Pneumovax 23 02/2014.  He is on aspirin 81 mg daily He is on ACE inhibitor He is on statin therapy  Office visit 12/09/2014 discussed diabetic eye exam. Wears glasses so he has an annual on exam. Next exam is due in August. Told him to make sure to inform them that he has diabetes so that they can check for any diabetic changes. At [place] 05/23/17 he reports that he just had an eye exam 3-4 months ago.    Cervical spine disease Also, he had 2 office visits with me in May 2016 regarding pain in his left shoulder and pain in his left neck. XRay cervical spine did show significant findings. Patient stated that he knew Dr. Earle Leblanc personally and  wanted to see him for follow-up. Patient tells me that his wife has taken care of Dr. Arnoldo Leblanc children and takes care of his pets when they are out of town.  Patient knows Dr. Arnoldo Leblanc and his mother who now lives with Dr. Arnoldo Leblanc.  He says that he has a great man. Says that his children are still not driving age and thinks that Dr. Arnoldo Leblanc is in his 82s. Says that because of his insurance deductible they decided to wait to do his MRI once the new "calendar year "starts regarding his insurance deductible. Says this restarts in October.  Says that way if he needs surgery, the MRI and the surgery would all be done within one calendar year and he would just pay the deductible that one time. He states that the prednisone I gave helped A LOT. Says that the paresthesias resolved completely. Says that he has just recently started to get a little bit of tingling into his fingers again but that it is very minimal. Today I discussed with him prednisone and the fact that you do not want to use it more frequently then absolutely necessary but at the same time if it is needed for him to call me and we can prescribe another round of this to hold him over until October. See HPI for update--at [facility] 09/15/15 and 12/16/2015 he does plan to schedule follow-up with Dr. Arnoldo Leblanc to discuss surgery.  ADDENDUM ADDED 03/02/2016: I received office note from Dr. Earle Leblanc at [facility]. His note dated 03/02/16. At that visit Mr. Grime told him that his neck was not bothering him much at  this point. Was having no radicular symptoms. Had limited his activity somewhat. Was not taking any pain medications. On exam his strength and reflexes were normal. Was felt that he does not need intervention at this point. Will plan to see him back in 6 months. 10/09/2016--Addendum Added--- Received OV note from Dr. Earle Leblanc dated 10/06/2016: "At this point he is neurologically normal and his symptoms are improving.  Will  take a wait and see approach. I'll see him back in a year to check on his progress. He knows to call sooner if has any more problems. "  07/05/2016: Neck pain is stable/controlled 05/23/2017---This is stable  Screening for prostate cancer -- PSA was 06/05/2013              Recheck --12/09/2014              Recheck 12/16/15 10/05/2016-- this was normal 12/16/15. Can wait to recheck. 05/23/2017 recheck PSA now.  Personal history of colonic polyps Colonoscopy 06/24/09 with polypectomy. Repeat 3-5 years. He did have repeat colonoscopy between April 2015 - October 2015 . Patient states that this showed a very small polyp. Was told to repeat 5 years.  Immunizations: Influenza Vaccine-- 04/2017 T. dap 03/2011. Pneumovax 23--Gave here-- 03/09/2014             No Other pneumonia vaccine indicated until age 21 Zostavax--not indicated until age 29. Discuss then. Shingrix--- 05/23/17----this is currently on back order. Will discuss at future visit.  At office visit 12/09/14 I discussed with patient that if labs remain stable he could wait 6 months for visit. He prefers to be seen every 3 months to closely monitor things. Routine Office visit 3 months. F/U  sooner if needed.   Signed, [address] [place], [place], [facility] 05/23/2017 8:31 AM

## 2017-05-24 ENCOUNTER — Encounter: Payer: Self-pay | Admitting: *Deleted

## 2017-05-24 LAB — COMPLETE METABOLIC PANEL WITH GFR
AG Ratio: 2 (calc) (ref 1.0–2.5)
ALT: 37 U/L (ref 9–46)
AST: 25 U/L (ref 10–35)
Albumin: 4.3 g/dL (ref 3.6–5.1)
Alkaline phosphatase (APISO): 78 U/L (ref 40–115)
BUN: 12 mg/dL (ref 7–25)
CO2: 24 mmol/L (ref 20–32)
Calcium: 9.6 mg/dL (ref 8.6–10.3)
Chloride: 103 mmol/L (ref 98–110)
Creat: 0.73 mg/dL (ref 0.70–1.33)
GFR, Est African American: 119 mL/min/{1.73_m2} (ref >=60)
GFR, Est Non African American: 102 mL/min/{1.73_m2} (ref >=60)
Globulin: 2.2 g/dL (calc) (ref 1.9–3.7)
Glucose, Bld: 97 mg/dL (ref 65–99)
Potassium: 4.2 mmol/L (ref 3.5–5.3)
Sodium: 139 mmol/L (ref 135–146)
Total Bilirubin: 0.5 mg/dL (ref 0.2–1.2)
Total Protein: 6.5 g/dL (ref 6.1–8.1)

## 2017-05-24 LAB — HEMOGLOBIN A1C
Hgb A1c MFr Bld: 5.9 % of total Hgb — ABNORMAL HIGH (ref <5.7)
Mean Plasma Glucose: 123 (calc)
eAG (mmol/L): 6.8 (calc)

## 2017-05-24 LAB — LIPID PANEL
Cholesterol: 159 mg/dL (ref <200)
HDL: 39 mg/dL — ABNORMAL LOW (ref >40)
LDL Cholesterol (Calc): 94 mg/dL (calc)
Non-HDL Cholesterol (Calc): 120 mg/dL (calc) (ref <130)
Total CHOL/HDL Ratio: 4.1 (calc) (ref <5.0)
Triglycerides: 159 mg/dL — ABNORMAL HIGH (ref <150)

## 2017-05-24 LAB — HEPATITIS C ANTIBODY
Hepatitis C Ab: NONREACTIVE
SIGNAL TO CUT-OFF: 0.01 (ref <1.00)

## 2017-05-24 LAB — PSA: PSA: 1 ng/mL (ref <=4.0)

## 2017-05-24 LAB — HIV ANTIBODY (ROUTINE TESTING W REFLEX): HIV 1&2 Ab, 4th Generation: NONREACTIVE

## 2017-06-21 ENCOUNTER — Encounter: Payer: Self-pay | Admitting: Physician Assistant

## 2017-07-18 ENCOUNTER — Other Ambulatory Visit: Payer: Self-pay | Admitting: Physician Assistant

## 2017-07-19 NOTE — Telephone Encounter (Signed)
Medication refilled per protocol. 

## 2017-07-23 ENCOUNTER — Other Ambulatory Visit: Payer: Self-pay | Admitting: Physician Assistant

## 2017-09-05 ENCOUNTER — Encounter: Payer: Self-pay | Admitting: Physician Assistant

## 2017-09-12 ENCOUNTER — Ambulatory Visit: Payer: 59 | Admitting: Physician Assistant

## 2017-09-12 ENCOUNTER — Encounter: Payer: Self-pay | Admitting: Physician Assistant

## 2017-09-12 ENCOUNTER — Other Ambulatory Visit: Payer: Self-pay

## 2017-09-12 ENCOUNTER — Telehealth: Payer: Self-pay

## 2017-09-12 VITALS — BP 134/86 | HR 106 | Temp 97.5°F | Resp 16 | Ht 70.0 in | Wt 245.0 lb

## 2017-09-12 DIAGNOSIS — J988 Other specified respiratory disorders: Secondary | ICD-10-CM | POA: Diagnosis not present

## 2017-09-12 DIAGNOSIS — B9689 Other specified bacterial agents as the cause of diseases classified elsewhere: Principal | ICD-10-CM

## 2017-09-12 MED ORDER — HYDROCODONE-HOMATROPINE 5-1.5 MG/5ML PO SYRP: 5.0000 mL | ORAL_SOLUTION | Freq: Four times a day (QID) | ORAL | 0 refills | Status: DC | PRN

## 2017-09-12 MED ORDER — AZITHROMYCIN 250 MG PO TABS: ORAL_TABLET | ORAL | 0 refills | Status: DC

## 2017-09-12 NOTE — Progress Notes (Signed)
Patient ID: Ian Leblanc MRN: [id number], DOB: [birth year], 59 y.o. Date of Encounter: 09/12/2017, 11:21 AM    Chief Complaint:  Chief Complaint  Patient presents with  . nasal drainage    x2 weeks   . head stopped up    ear ache  . Headache    slight      HPI: 57 y.o. year old male presents with above.  Reports that he has been having congestion in his head and nose.  Has been blowing his nose for 2 weeks now.  States that it is feels like it starting to go into his chest the last few days.  Cough has been keeping him awake at night.  Has had no fevers.     Home Meds:   Outpatient Medications Prior to Visit  Medication Sig Dispense Refill  . aspirin 81 MG tablet Take 81 mg by mouth daily.    Marland Kitchen atorvastatin (LIPITOR) 80 MG tablet TAKE ONE TABLET BY MOUTH ONCE DAILY AT  6PM 90 tablet 1  . benazepril (LOTENSIN) 20 MG tablet TAKE 1 TABLET BY MOUTH ONCE DAILY **PATIENT  DUE  FOR  OFFICE  VISIT** 90 tablet 0  . fluticasone (FLONASE) 50 MCG/ACT nasal spray Place 2 sprays into both nostrils daily. 16 g 6  . hydrochlorothiazide (MICROZIDE) 12.5 MG capsule TAKE 1 CAPSULE BY MOUTH ONCE DAILY **PATIENT DUE FOR OFFICE VIST** 90 capsule 3  . metFORMIN (GLUCOPHAGE) 1000 MG tablet TAKE ONE TABLET BY MOUTH TWICE DAILY WITH MEALS 180 tablet 3  . Multiple Vitamins-Minerals (MULTIVITAMIN PO) Take 1 tablet by mouth daily. Over 50 mvi qd    . Omega-3 Fatty Acids (OMEGA-3 FISH OIL PO) Take 600 mg by mouth daily.     . pioglitazone (ACTOS) 30 MG tablet TAKE 1 TABLET BY MOUTH ONCE DAILY 90 tablet 3  . predniSONE (DELTASONE) 20 MG tablet Take 3 daily for 2 days, then 2 daily for 2 days, then 1 daily for 2 days. 12 tablet 0   No facility-administered medications prior to visit.     Allergies:  Allergies  Allergen Reactions  . Crestor [Rosuvastatin] Other (See Comments)    Severe myalgias      Review of Systems: See HPI for pertinent ROS. All other ROS negative.    Physical Exam: Blood  pressure 134/86, pulse (!) 106, temperature (!) 97.5 F (36.4 C), temperature source Oral, resp. rate 16, height 5\' 10"  (1.778 m), weight 111.1 kg (245 lb), SpO2 98 %., Body mass index is 35.15 kg/m. General:  WNWD WM. Appears in no acute distress. HEENT: Normocephalic, atraumatic, eyes without discharge, sclera non-icteric, nares are without discharge. Bilateral auditory canals clear, TM's are without perforation, pearly grey and translucent with reflective cone of light bilaterally. Oral cavity moist, posterior pharynx without exudate, erythema, peritonsillar abscess.  No tenderness with percussion to frontal or maxillary sinuses bilaterally.  Neck: Supple. No thyromegaly. No lymphadenopathy. Lungs: Clear bilaterally to auscultation without wheezes, rales, or rhonchi. Breathing is unlabored. Heart: Regular rhythm. No murmurs, rubs, or gallops. Msk:  Strength and tone normal for age. Extremities/Skin: Warm and dry. Neuro: Alert and oriented X 3. Moves all extremities spontaneously. Gait is normal. CNII-XII grossly in tact. Psych:  Responds to questions appropriately with a normal affect.     ASSESSMENT AND PLAN:  57 y.o. year old male with  1. Bacterial respiratory infection He is to take the antibiotic as directed.  Follow-up if symptoms do not resolve within 1 week after completion  of antibiotic.  He can use the Hycodan at night as cough suppressant so that he can sleep. - azithromycin (ZITHROMAX) 250 MG tablet; Day 1: Take 2 daily. Days 2 -5: Take 1 daily.  Dispense: 6 tablet; Refill: 0 - HYDROcodone-homatropine (HYCODAN) 5-1.5 MG/5ML syrup; Take 5 mLs by mouth every 6 (six) hours as needed for cough.  Dispense: 120 mL; Refill: 0   Signed, [address] [place], [place], [facility] 09/12/2017 11:21 AM

## 2017-09-12 NOTE — Telephone Encounter (Signed)
-----   Message from Alyson Locket, [place] sent at 09/12/2017  3:35 PM EST ----- [facility] called and states something about his ins not covering his Hycodan but for so many ML's needs you to call them back to ok it. [phone]

## 2017-09-12 NOTE — Telephone Encounter (Signed)
Call placed to pharmacy  and they stated everything had been taking care of

## 2017-09-17 ENCOUNTER — Telehealth: Payer: Self-pay

## 2017-09-17 NOTE — Telephone Encounter (Signed)
Levaquin 750 mg ---1 p.o. daily x 7 days--- dispense  #7  + 0.

## 2017-09-17 NOTE — Telephone Encounter (Signed)
Patient called and states he is still having symptoms, he has a sinus headache as well as drainage, Patient states it is not as bad but is still stopped up and drainage is the main problem  and is requesting another antibiotic be called in.   Pls advise

## 2017-09-18 MED ORDER — LEVOFLOXACIN 750 MG PO TABS: 750.0000 mg | ORAL_TABLET | Freq: Every day | ORAL | 0 refills | Status: DC

## 2017-09-18 NOTE — Telephone Encounter (Signed)
Patient aware of new rx

## 2017-10-04 ENCOUNTER — Other Ambulatory Visit: Payer: Self-pay | Admitting: Physician Assistant

## 2017-10-09 ENCOUNTER — Other Ambulatory Visit: Payer: Self-pay

## 2017-10-09 MED ORDER — ATORVASTATIN CALCIUM 80 MG PO TABS: ORAL_TABLET | ORAL | 1 refills | Status: DC

## 2017-10-15 ENCOUNTER — Encounter: Payer: Self-pay | Admitting: Physician Assistant

## 2017-10-15 ENCOUNTER — Ambulatory Visit: Payer: 59 | Admitting: Physician Assistant

## 2017-10-15 VITALS — BP 130/86 | HR 98 | Temp 97.8°F | Resp 16 | Wt 242.4 lb

## 2017-10-15 DIAGNOSIS — J069 Acute upper respiratory infection, unspecified: Secondary | ICD-10-CM | POA: Diagnosis not present

## 2017-10-15 MED ORDER — PREDNISONE 20 MG PO TABS: ORAL_TABLET | ORAL | 0 refills | Status: DC

## 2017-10-15 NOTE — Progress Notes (Signed)
Patient ID: Ian Leblanc MRN: [id number], DOB: [birth year], 59 y.o. Date of Encounter: 10/15/2017, 12:44 PM    Chief Complaint:  Chief Complaint  Patient presents with  . Otalgia    X3days  . Nasal Congestion     HPI: 57 y.o. year old male presents with above.   I reviewed his office note from 09/12/17.  At that visit he reported that he been having congestion in his head and nose.  Had been blowing his nose for 2 weeks at that point.  [name] it felt like it was starting to go into his chest the last few days prior to that visit.  The cough was keeping him awake at night.  Had been having no fevers.  At that visit prescribed Z-Pak and Hycodan.  He then called on 09/17/17 reported that he was still having symptoms.   Was continuing with sinus headache as well as drainage.  At that time prescribed Levaquin 750 mg daily for 7 days.  Today he reports that ever since then--- if he lays on his left side, then he still feels clogged up in his left maxillary sinus region -- if he lays on his left side.  Also says that for the past 4 days he has been feeling discomfort in his left ear.  Says that he hears a crinkly sound in the left ear.  Says that he does blow out drainage from his nose first thing in the morning but otherwise is not getting any drainage from his nose.  Is having no chest congestion or cough.  Having no fevers or chills.     Home Meds:   Outpatient Medications Prior to Visit  Medication Sig Dispense Refill  . aspirin 81 MG tablet Take 81 mg by mouth daily.    Marland Kitchen atorvastatin (LIPITOR) 80 MG tablet TAKE 1 TABLET BY MOUTH ONCE DAILY AT  6  PM 90 tablet 1  . benazepril (LOTENSIN) 20 MG tablet TAKE 1 TABLET BY MOUTH ONCE DAILY **PATIENT  DUE  FOR  OFFICE  VISIT** 90 tablet 0  . fluticasone (FLONASE) 50 MCG/ACT nasal spray Place 2 sprays into both nostrils daily. 16 g 6  . hydrochlorothiazide (MICROZIDE) 12.5 MG capsule TAKE 1 CAPSULE BY MOUTH ONCE DAILY **PATIENT DUE FOR  OFFICE VIST** 90 capsule 3  . HYDROcodone-homatropine (HYCODAN) 5-1.5 MG/5ML syrup Take 5 mLs by mouth every 6 (six) hours as needed for cough. 120 mL 0  . metFORMIN (GLUCOPHAGE) 1000 MG tablet TAKE ONE TABLET BY MOUTH TWICE DAILY WITH MEALS 180 tablet 3  . Multiple Vitamins-Minerals (MULTIVITAMIN PO) Take 1 tablet by mouth daily. Over 50 mvi qd    . Omega-3 Fatty Acids (OMEGA-3 FISH OIL PO) Take 600 mg by mouth daily.     . pioglitazone (ACTOS) 30 MG tablet TAKE 1 TABLET BY MOUTH ONCE DAILY 90 tablet 3  . azithromycin (ZITHROMAX) 250 MG tablet Day 1: Take 2 daily. Days 2 -5: Take 1 daily. 6 tablet 0  . levofloxacin (LEVAQUIN) 750 MG tablet Take 1 tablet (750 mg total) by mouth daily. 7 tablet 0   No facility-administered medications prior to visit.     Allergies:  Allergies  Allergen Reactions  . Crestor [Rosuvastatin] Other (See Comments)    Severe myalgias      Review of Systems: See HPI for pertinent ROS. All other ROS negative.    Physical Exam: Blood pressure 130/86, pulse 98, temperature 97.8 F (36.6 C), temperature source Oral, resp. rate 16,  weight 110 kg (242 lb 6.4 oz), SpO2 99 %., Body mass index is 34.78 kg/m. General: WNWD WM.  Appears in no acute distress. HEENT: Normocephalic, atraumatic, eyes without discharge, sclera non-icteric, nares are without discharge. Bilateral auditory canals clear, TM's are without perforation, pearly grey and translucent with reflective cone of light bilaterally. Oral cavity moist, posterior pharynx without exudate, erythema, peritonsillar abscess.  No tenderness with percussion to frontal or maxillary sinuses bilaterally.  Neck: Supple. No thyromegaly. No lymphadenopathy. Lungs: Clear bilaterally to auscultation without wheezes, rales, or rhonchi. Breathing is unlabored. Heart: Regular rhythm. No murmurs, rubs, or gallops. Msk:  Strength and tone normal for age. Extremities/Skin: Warm and dry.  Neuro: Alert and oriented X 3. Moves all  extremities spontaneously. Gait is normal. CNII-XII grossly in tact. Psych:  Responds to questions appropriately with a normal affect.     ASSESSMENT AND PLAN:  57 y.o. year old male with  1. Acute upper respiratory infection At this time will add prednisone to treat any persistent inflammation in the sinus region.  Also recommend that he use decongestant.  He does not need another antibiotic at this time but if he does have increasing mucus, he is to call me and then would treat with another round of antibiotic. - predniSONE (DELTASONE) 20 MG tablet; Take 2 daily for 3 days then 1 daily for 3 days.  Dispense: 9 tablet; Refill: 0   Signed, [address] [place], [place], [facility] 10/15/2017 12:44 PM

## 2017-10-28 ENCOUNTER — Other Ambulatory Visit: Payer: Self-pay | Admitting: Physician Assistant

## 2017-11-05 ENCOUNTER — Ambulatory Visit: Payer: 59 | Admitting: Physician Assistant

## 2017-11-05 ENCOUNTER — Encounter: Payer: Self-pay | Admitting: Physician Assistant

## 2017-11-05 VITALS — BP 136/88 | HR 96 | Temp 98.0°F | Resp 16 | Wt 248.6 lb

## 2017-11-05 DIAGNOSIS — B029 Zoster without complications: Secondary | ICD-10-CM

## 2017-11-05 MED ORDER — HYDROCODONE-ACETAMINOPHEN 5-325 MG PO TABS: 1.0000 | ORAL_TABLET | Freq: Four times a day (QID) | ORAL | 0 refills | Status: DC | PRN

## 2017-11-05 MED ORDER — VALACYCLOVIR HCL 1 G PO TABS: 1000.0000 mg | ORAL_TABLET | Freq: Three times a day (TID) | ORAL | 0 refills | Status: AC

## 2017-11-05 MED ORDER — GABAPENTIN 300 MG PO CAPS: ORAL_CAPSULE | ORAL | 2 refills | Status: DC

## 2017-11-05 NOTE — Progress Notes (Signed)
Patient ID: Ian Leblanc MRN: [id number], DOB: 12/09/1960, 57 y.o. Date of Encounter: 11/05/2017, 9:51 AM    Chief Complaint:  Chief Complaint  Patient presents with  . right ear ache  . Rash    on right side of face     HPI: 57 y.o. year old male presents with above.  He reports that during the day this past Friday he felt fine.  However states that that Saturday morning he slept late-- which is a very unusual for him.  States that on Saturday morning he also noticed that the right side of his face felt hypersensitive.  It felt very sore to lay on the right side of his face.  Thought maybe he was getting an ear ache.  He noticed on Sunday that the right side of his face "looked swollen and red. " Today he is continuing with hypersensitivity and pain in the right upper face and some rash is present on the right forehead/ scalp-- into the hairline.  Has noticed no other additional symptoms or areas of rash or other abnormalities.     Home Meds:   Outpatient Medications Prior to Visit  Medication Sig Dispense Refill  . aspirin 81 MG tablet Take 81 mg by mouth daily.    Marland Kitchen atorvastatin (LIPITOR) 80 MG tablet TAKE 1 TABLET BY MOUTH ONCE DAILY AT  6  PM 90 tablet 1  . benazepril (LOTENSIN) 20 MG tablet TAKE 1 TABLET BY MOUTH ONCE DAILY **PATIENT  DUE  FOR  OFFICE  VISIT** 30 tablet 0  . fluticasone (FLONASE) 50 MCG/ACT nasal spray Place 2 sprays into both nostrils daily. 16 g 6  . hydrochlorothiazide (MICROZIDE) 12.5 MG capsule TAKE 1 CAPSULE BY MOUTH ONCE DAILY **PATIENT DUE FOR OFFICE VIST** 90 capsule 3  . HYDROcodone-homatropine (HYCODAN) 5-1.5 MG/5ML syrup Take 5 mLs by mouth every 6 (six) hours as needed for cough. 120 mL 0  . metFORMIN (GLUCOPHAGE) 1000 MG tablet TAKE ONE TABLET BY MOUTH TWICE DAILY WITH MEALS 180 tablet 3  . Multiple Vitamins-Minerals (MULTIVITAMIN PO) Take 1 tablet by mouth daily. Over 50 mvi qd    . Omega-3 Fatty Acids (OMEGA-3 FISH OIL PO) Take 600 mg by  mouth daily.     . pioglitazone (ACTOS) 30 MG tablet TAKE 1 TABLET BY MOUTH ONCE DAILY 90 tablet 3  . predniSONE (DELTASONE) 20 MG tablet Take 2 daily for 3 days then 1 daily for 3 days. 9 tablet 0   No facility-administered medications prior to visit.     Allergies:  Allergies  Allergen Reactions  . Crestor [Rosuvastatin] Other (See Comments)    Severe myalgias      Review of Systems: See HPI for pertinent ROS. All other ROS negative.    Physical Exam: Blood pressure 136/88, pulse 96, temperature 98 F (36.7 C), temperature source Oral, resp. rate 16, weight 112.8 kg (248 lb 9.6 oz), SpO2 94 %., Body mass index is 35.67 kg/m. General:  WNWD WM. Appears in no acute distress. HEENT: Normocephalic, atraumatic, eyes without discharge, sclera non-icteric, nares are without discharge. Bilateral auditory canals clear, TM's are without perforation, pearly grey and translucent with reflective cone of light bilaterally. Oral cavity moist, posterior pharynx without exudate, erythema, peritonsillar abscess.  Neck: Supple. No thyromegaly. No lymphadenopathy. Lungs: Clear bilaterally to auscultation without wheezes, rales, or rhonchi. Breathing is unlabored. Heart: Regular rhythm. No murmurs, rubs, or gallops. Msk:  Strength and tone normal for age. Skin: There is no rash on  nose. Inspection of eyes is normal.  There is erythema, resolving papules/vessicles on right forehead, scalp into hairline.  No other/additonal areas of rash. Neuro: Alert and oriented X 3. Moves all extremities spontaneously. Gait is normal. CNII-XII grossly in tact. Psych:  Responds to questions appropriately with a normal affect.     ASSESSMENT AND PLAN:  57 y.o. year old male with   1. Herpes zoster without complication Valtrex 1 g 3 times daily times 7 days.  He is to take this as directed. Also prescribed gabapentin 300 mg--- Day 1:  he is to take 1 daily.  On day 2:  he is to take 1 twice daily.  Starting on  day 3 he is to start taking one 3 times daily.  Discussed with him that this is for nerve pain.  Discussed that can increase to  higher dose if needed. If he feels that he needs higher dose--  call me for further instructions and we can gradually titrate dose further. Also gave him some hydrocodone 5 mg #30 to have available to use as needed for pain in addition.  Experiences pain mostly at night.  He states that the pain is worst at night because it definitely hurts bad if he tries to lay on his right side but even if he lays on his left side still feels a lot of pain and pressure in the right face.   Signed, [address] [place], [place], [facility] 11/05/2017 9:51 AM

## 2017-11-07 ENCOUNTER — Telehealth: Payer: Self-pay

## 2017-11-07 ENCOUNTER — Other Ambulatory Visit: Payer: Self-pay

## 2017-11-07 DIAGNOSIS — B0239 Other herpes zoster eye disease: Secondary | ICD-10-CM | POA: Diagnosis not present

## 2017-11-07 DIAGNOSIS — H5789 Other specified disorders of eye and adnexa: Secondary | ICD-10-CM

## 2017-11-07 DIAGNOSIS — H2513 Age-related nuclear cataract, bilateral: Secondary | ICD-10-CM | POA: Diagnosis not present

## 2017-11-07 NOTE — Telephone Encounter (Signed)
Call placed to patient he is aware of the referral

## 2017-11-07 NOTE — Telephone Encounter (Signed)
Patient called and states the right side of his  head was swollen this morning and his right eye was swollen shut. Patient states he is having to continue to put ice on his head to help with swelling  His eye has since opened. Patient would like to know what he should do concerning the swelling

## 2017-11-07 NOTE — Telephone Encounter (Signed)
I recommend that he follow-up with an eye doctor. If this virus gets in his eyes this can cause additional issues with his eye. Find out if he already has an eye doctor and if so then call and get appointment with them---or, can refer to a "new" eye doctor--URGENT---NEEDS TO BE SEEN WITHIN 24 HOURS

## 2017-11-15 NOTE — Telephone Encounter (Signed)
Patient was seen on 11/07/17.

## 2017-11-21 ENCOUNTER — Ambulatory Visit: Payer: 59 | Admitting: Physician Assistant

## 2017-12-04 ENCOUNTER — Other Ambulatory Visit: Payer: Self-pay | Admitting: Physician Assistant

## 2018-01-08 ENCOUNTER — Other Ambulatory Visit: Payer: Self-pay | Admitting: Physician Assistant

## 2018-01-11 ENCOUNTER — Other Ambulatory Visit: Payer: Self-pay | Admitting: Physician Assistant

## 2018-02-09 ENCOUNTER — Other Ambulatory Visit: Payer: Self-pay | Admitting: Physician Assistant

## 2018-03-14 ENCOUNTER — Other Ambulatory Visit: Payer: Self-pay | Admitting: Physician Assistant

## 2018-03-28 ENCOUNTER — Ambulatory Visit: Payer: 59 | Admitting: Physician Assistant

## 2018-03-28 ENCOUNTER — Encounter: Payer: Self-pay | Admitting: Physician Assistant

## 2018-03-28 VITALS — BP 130/90 | HR 79 | Temp 97.9°F | Resp 20 | Ht 70.0 in | Wt 215.4 lb

## 2018-03-28 DIAGNOSIS — M489 Spondylopathy, unspecified: Secondary | ICD-10-CM

## 2018-03-28 DIAGNOSIS — I10 Essential (primary) hypertension: Secondary | ICD-10-CM

## 2018-03-28 DIAGNOSIS — B029 Zoster without complications: Secondary | ICD-10-CM

## 2018-03-28 DIAGNOSIS — E119 Type 2 diabetes mellitus without complications: Secondary | ICD-10-CM | POA: Diagnosis not present

## 2018-03-28 DIAGNOSIS — E785 Hyperlipidemia, unspecified: Secondary | ICD-10-CM

## 2018-03-28 MED ORDER — VALACYCLOVIR HCL 1 G PO TABS: 1000.0000 mg | ORAL_TABLET | Freq: Three times a day (TID) | ORAL | 0 refills | Status: AC

## 2018-03-28 MED ORDER — GABAPENTIN 300 MG PO CAPS: ORAL_CAPSULE | ORAL | 2 refills | Status: DC

## 2018-03-28 NOTE — Progress Notes (Signed)
Patient ID: [place] MRN: [id number], DOB: 1961/04/16, 57 y.o. Date of Encounter: @DATE @  Chief Complaint:  Chief Complaint  Patient presents with  . follow up with shingles  . follow up with cholesterol    HPI: 57 y.o. year old white male  presents for routine followup office visit.   09/15/2015: He says that his son, daughter-in-law, and children did come for Christmas from [place]. Says that it was absolutely wonderful. Patient's mother, who has been a patient here, passed away right before Christmas. However she says that all of this actually worked out. Says that he feels that it was "meant to be" that his son did not come in the summer and came now. Says that the family was able to see his mother prior to her passing. In regards to his mother, he knows that she is in a much better place as she had very poor quality of life for the past few years. He does state that his cervical spine and symptoms down the left arm are "annoying" and he is planning to schedule follow-up visit with Dr. Vivianne Spence he can hear again what his surgical plan would be and "get his head wrapped around it" and start mentally preparing to do surgery.  Says that he is concerned about the risk, but that his symptoms are annoying and that it is very difficult for him to sit still and not do anything--- not help his children when they need him to do something, not play golf etc. Also says that right at Christmas we were booked up so he had to go to an urgent care for respiratory infection. Says he was treated with a Z-Pak and 2 shots. Says that he still has a lingering cough ever since then it just won't quite go away. No other complaints or concerns. Taking metformin with no adverse effects. Still compliant with diet and exercise. Weight is actually down another few pounds since his last visit. Taking blood pressure medicine as directed with no adverse effects. No lightheadedness. Taking cholesterol  medication as directed with no adverse effects. No myalgias.  12/16/2015: Regarding his grandchildren--says that family that is local had another baby---says his wife loves the baby but he prefers to play with the older child!  Says the son in [place] also has had another one--so their whole family will not be coming--just his son and older child will come next time. (? This summer, I think?) Says he scheduled f/u with Dr. Arnoldo Morale but he was booked out so his appt isn't until August. Says he just has a constant achy, nagging discomfort. Not having any more paresthesias etc down arm or hand. Avoids pulling on things. Is able to play golf without increased symptoms.  Taking all meds as directed. No adv effects.  Reviewed that labs performed at [facility]--09/15/15--LDL was 114--recommended he change Zocor 80 to Lipitor 80mg . He says he did make that change and he is fasting today.    ADDENDUM ADDED 03/02/2016: I received office note from Dr. Earle Gell at [facility]. His note dated 03/02/16. At that visit [name] told him that his neck was not bothering him much at this point. Was having no radicular symptoms. Had limited his activity somewhat. Was not taking any pain medications. On exam his strength and reflexes were normal. Was felt that he does not need intervention at this point. Will plan to see him back in 6 months.   04/03/2016; He says they are getting ready to  go to the beach. The grandkids that live here are going with them. Their parents will come 2 days later.  The son in [place] decided to wait and come at [place]--so the entire family can come.   He says at night when he sleeps on left side--left side gets stopped up. Rolls onto right side--can breathe.  Blows his nose---it is green mucus.  No chest congestion. No cough. No sore throat.   07/05/2016: He states that he has been sick for the past several days. On Saturday he worked outside in the yard. Says that his  hair was soaking wet with sweat. Says that on Sunday he had sweats aches pains chills. Says that Monday night fever was 101. Says he has had some sore throat but thinks that it is secondary to drainage down his throat. Says he has been getting mucus from his nose and has had some cough. He has taken no antipyretics this morning and temperature is reading 98.3 here.  I reviewed that his A1c at last visit 04/03/16 A1c came back 7.6. At that time I recommended him add Actos 30 mg daily. He reports that he did add the Actos and is taking this daily. Having no adverse effects.  Taking metformin and Actos as directed. Being compliant with diet and exercise.  Taking BP meds as directed. No lightheadedness or other adv effects.  Taking Lipitor as directed. No myalgias or other adv effects.   All of his family will be here for Christmas including all of the family in [place] !!   10/05/2016: He states that the family in [place] ended up not being able to come for Christmas. They had just bought a house and so it didn't work out. No other updates today. He has been feeling well and has no concerns to address today. Taking metformin and Actos as directed. Being compliant with diet and exercise.  Taking BP meds as directed. No lightheadedness or other adv effects.  Taking Lipitor as directed. No myalgias or other adv effects.    10/09/2016--Addendum Added--- Received [facility] note from Dr. Earle Gell dated 10/06/2016: "At this point he is neurologically normal and his symptoms are improving.  Will take a wait and see approach. I'll see him back in a year to check on his progress. He knows to call sooner if has any more problems. "   05/23/2017: He reports that his son's family in [place]---is planning to come in for Christmas. He reports that the grandchildren who live close by--one of them is 59 years old the other is 36 months old. He says that the one who is  " 104 months old is very quick and if you turn for 1  second he is gone" Today he reports that he has been having some sinus congestion and just very mild slight headache. Says that if he sleeps on his left side it is clogged so has to sleep on the right side. Is not blowing anything out of his nose. Is using his Flonase but still having symptoms. No sore throat. No chest congestion. No fevers or chills. Taking metformin and Actos as directed. Being compliant with diet and exercise.  Taking BP meds as directed. No lightheadedness or other adv effects.  Taking Lipitor as directed. No myalgias or other adv effects.  He does walk 15-20 minutes every morning. He is continuing to do pretty good in regards to his diet. Symptoms related to his neck are still stable. Still does not need to  proceed with surgery at this time. States that he just had an eye exam 3 or 4 months ago. Also just got flu shot at [facility] last week.    03/28/2018: Today he reports that his family in [place] did come this past Christmas and they plan to return this upcoming Christmas.  Says that they consider them coming in the summer but that it is a lot less expensive for them to travel at Christmas for some reason.  For some reason the flights are cheaper at that time. Reports that he has started jogging 2 miles every day for the last 6 weeks.  Says that he goes early in the morning and jogs 2.2 miles.  States that this is the only way he can figure out to keep up with his grandson !!!  That his grandson who is 64 years old is his little but he.  Says that he felt like he was dragging when he was trying to keep up with him.  That he started by cutting out bread then started doing some walking.  Ever still did not feel like he was energetic and shape enough to keep up with his grandson.  Then noticed other people were jogging so he decided to join in and now is feeling so much better. Says that he is down 32 pounds !!!! He points to one little area of rash just above his right eyebrow and states  that that is the only area of shingles rash that he still has.  However points to top of his head and states that he thinks he does need to get back on the gabapentin.  Says that he was taking it twice a day but has been out of it for the past week.  Anytime he is outside in the sun is shining on his head that it feels like ants are crawling on his head.  Says that at night in his sleep he is scratching it and does not realize it. Has no other specific concerns to address. Taking metformin and Actos as directed. Being compliant with diet and exercise.  Taking BP meds as directed. No lightheadedness or other adv effects.  Taking Lipitor as directed. No myalgias or other adv effects.   I did review weights.   Visit 05/23/2017----- weight was 246 pound. ------- 11/05/2017 ----was 248 pounds. Today--03/28/2018------- 215 pound.     Past Medical History:  Diagnosis Date  . Hyperglycemia   . Hyperlipidemia   . Hypertension   . Sessile colonic polyp      Home Meds:  Outpatient Medications Prior to Visit  Medication Sig Dispense Refill  . aspirin 81 MG tablet Take 81 mg by mouth daily.    Marland Kitchen atorvastatin (LIPITOR) 80 MG tablet TAKE 1 TABLET BY MOUTH ONCE DAILY AT  6  PM 90 tablet 1  . benazepril (LOTENSIN) 20 MG tablet TAKE 1 TABLET BY MOUTH ONCE DAILY ( PATIENT DUE FOR OFFICE VISIT) 30 tablet 0  . fluticasone (FLONASE) 50 MCG/ACT nasal spray Place 2 sprays into both nostrils daily. 16 g 6  . gabapentin (NEURONTIN) 300 MG capsule Day 1: Take one daily. Day 2: Take one twice a day. Day 3: Take one 3 times a day.Cont. this dose. 90 capsule 2  . hydrochlorothiazide (MICROZIDE) 12.5 MG capsule TAKE 1 CAPSULE BY MOUTH ONCE DAILY **PATIENT DUE FOR OFFICE VIST** 90 capsule 3  . HYDROcodone-acetaminophen (NORCO/VICODIN) 5-325 MG tablet Take 1 tablet by mouth every 6 (six) hours as needed. [place]  tablet 0  . HYDROcodone-homatropine (HYCODAN) 5-1.5 MG/5ML syrup Take 5 mLs by mouth every 6 (six) hours as needed  for cough. 120 mL 0  . metFORMIN (GLUCOPHAGE) 1000 MG tablet TAKE 1 TABLET BY MOUTH TWICE DAILY WITH MEALS 60 tablet 11  . Multiple Vitamins-Minerals (MULTIVITAMIN PO) Take 1 tablet by mouth daily. Over 50 mvi qd    . Omega-3 Fatty Acids (OMEGA-3 FISH OIL PO) Take 600 mg by mouth daily.     . pioglitazone (ACTOS) 30 MG tablet TAKE 1 TABLET BY MOUTH ONCE DAILY 90 tablet 3   No facility-administered medications prior to visit.      Allergies:  Allergies  Allergen Reactions  . Crestor [Rosuvastatin] Other (See Comments)    Severe myalgias    Social History   Socioeconomic History  . Marital status: Married    Spouse name: Not on file  . Number of children: Not on file  . Years of education: Not on file  . Highest education level: Not on file  Occupational History  . Not on file  Social Needs  . Financial resource strain: Not on file  . Food insecurity:    Worry: Not on file    Inability: Not on file  . Transportation needs:    Medical: Not on file    Non-medical: Not on file  Tobacco Use  . Smoking status: Former Smoker    Last attempt to quit: 07/17/2003    Years since quitting: 14.7  . Smokeless tobacco: Never Used  Substance and Sexual Activity  . Alcohol use: No  . Drug use: No  . Sexual activity: Not on file  Lifestyle  . Physical activity:    Days per week: Not on file    Minutes per session: Not on file  . Stress: Not on file  Relationships  . Social connections:    Talks on phone: Not on file    Gets together: Not on file    Attends religious service: Not on file    Active member of club or organization: Not on file    Attends meetings of clubs or organizations: Not on file    Relationship status: Not on file  . Intimate partner violence:    Fear of current or ex partner: Not on file    Emotionally abused: Not on file    Physically abused: Not on file    Forced sexual activity: Not on file  Other Topics Concern  . Not on file  Social History  Narrative  . Not on file    History reviewed. No pertinent family history.   Review of Systems:  See HPI for pertinent ROS. All other ROS negative.     Physical Exam: Blood pressure 130/90, pulse 79, temperature 97.9 F (36.6 C), temperature source Oral, resp. rate 20, height 5\' 10"  (1.778 m), weight 97.7 kg, SpO2 95 %., Body mass index is 30.91 kg/m. General: WNWD WM. Appears in no acute distress. Neck: Supple. No thyromegaly. No lymphadenopathy. No carotid bruits. Lungs: Clear bilaterally to auscultation without wheezes, rales, or rhonchi. Breathing is unlabored. Heart: RRR with S1 S2. No murmurs, rubs, or gallops. Abdomen: Soft, non-tender, non-distended with normoactive bowel sounds. No hepatomegaly. No rebound/guarding. No obvious abdominal masses. Musculoskeletal:  Strength and tone normal for age. Extremities/Skin: Warm and dry.  No edema.  Skin: Just above right eyebrow--there is small "splotch" of light pink erythema. No other area of rash Neuro: Alert and oriented X 3. Moves all extremities spontaneously. Gait  is normal. CNII-XII grossly in tact. Psych:  Responds to questions appropriately with a normal affect.    Diabetic Foot Exam: Inspection is normal. He has no calluses or other problematic areas. No wounds. Feet look very good. 2+ dorsalis pedis and posterior tibial pulses bilaterally.      ASSESSMENT AND PLAN:  57 y.o. year old male with      HTN (hypertension) 03/28/2018: His blood pressure  Is well controlled. Cont curent medications.  HLD (hyperlipidemia) 12/09/2014---  increased simvastatin from 40 mg to 80 mg. 03/11/2015--- rechecked FLP and LFTs. LDL was down to 90. 09/15/2015---LDL 114--Changed Zocor 80 to Lipitor 80mg .  12/16/15---Recheck lab on Lipito 80mg  07/05/2016---recheck labs now. Last FLP--LDL was at goal--was 72 10/05/2016-  he had labs at last visit. Continue current statin. 05/23/17: He is on statin. He is fasting. Recheck FLP, LFT. 03/28/2018:  Has had significant weight loss secondary to diet and exercise.  Will be interesting to see if his lipids are much improved.  He is also on Lipitor and continues this.  He is fasting.  Recheck labs now.  DIABETES 11/2013 started Metformin.  Also weight down 9 pounds compared to 4/15 secondary to diet changes at night. 02/2014 A1C went down to 6.1 (from 7.2-- 11/2013) - Hemoglobin A1c -Micro Albumin---07/05/2016  05/23/2017: Diabetes has been controlled. Will recheck following labs to monitor: A1C,  CMET  Gave Pneumovax 23 02/2014.  He is on aspirin 81 mg daily He is on ACE inhibitor He is on statin therapy  Office visit 12/09/2014 discussed diabetic eye exam. Wears glasses so he has an annual on exam. Next exam is due in August. Told him to make sure to inform them that he has diabetes so that they can check for any diabetic changes. At [place] 05/23/17 he reports that he just had an eye exam 3-4 months ago.  03/28/2018: Today he reports that he has had significant weight loss secondary to improved diet and addition of exercise.  He has also continued diabetic medications.  Will recheck A1c today.  May be able to decrease some medications.   Herpes zoster without complication 10/24/6292: He still has 1 spot of rash above his right eyebrow.  Will treat with another round of Valtrex to see if this can get this controlled/ resolved.  Also will send refill to resume his gabapentin for postherpetic neuralgia.     Cervical spine disease Also, he had 2 office visits with me in May 2016 regarding pain in his left shoulder and pain in his left neck. XRay cervical spine did show significant findings. Patient stated that he knew Dr. Earle Gell personally and wanted to see him for follow-up. Patient tells me that his wife has taken care of Dr. Arnoldo Morale children and takes care of his pets when they are out of town.  Patient knows Dr. Arnoldo Morale and his mother who now lives with Dr. Arnoldo Morale.  He says that he has a  great man. Says that his children are still not driving age and thinks that Dr. Arnoldo Morale is in his 32s. Says that because of his insurance deductible they decided to wait to do his MRI once the new "calendar year "starts regarding his insurance deductible. Says this restarts in October.  Says that way if he needs surgery, the MRI and the surgery would all be done within one calendar year and he would just pay the deductible that one time. He states that the prednisone I gave helped A LOT. Says that the  paresthesias resolved completely. Says that he has just recently started to get a little bit of tingling into his fingers again but that it is very minimal. Today I discussed with him prednisone and the fact that you do not want to use it more frequently then absolutely necessary but at the same time if it is needed for him to call me and we can prescribe another round of this to hold him over until October. See HPI for update--at [facility] 09/15/15 and 12/16/2015 he does plan to schedule follow-up with Dr. Arnoldo Morale to discuss surgery.  ADDENDUM ADDED 03/02/2016: I received office note from Dr. Earle Gell at [facility]. His note dated 03/02/16. At that visit Mr. Oguinn told him that his neck was not bothering him much at this point. Was having no radicular symptoms. Had limited his activity somewhat. Was not taking any pain medications. On exam his strength and reflexes were normal. Was felt that he does not need intervention at this point. Will plan to see him back in 6 months. 10/09/2016--Addendum Added--- Received [facility] note from Dr. Earle Gell dated 10/06/2016: "At this point he is neurologically normal and his symptoms are improving.  Will take a wait and see approach. I'll see him back in a year to check on his progress. He knows to call sooner if has any more problems. "  07/05/2016: Neck pain is stable/controlled 05/23/2017---This is stable  Screening for prostate cancer -- PSA was  06/05/2013              Recheck --12/09/2014              Recheck 12/16/15 10/05/2016-- this was normal 12/16/15. Can wait to recheck. 05/23/2017 recheck PSA now.  Personal history of colonic polyps Colonoscopy 06/24/09 with polypectomy. Repeat 3-5 years. He did have repeat colonoscopy between April 2015 - October 2015 . Patient states that this showed a very small polyp. Was told to repeat 5 years.  Immunizations: Influenza Vaccine-- 04/2017 T. dap 03/2011. Pneumovax 23--Gave here-- 03/09/2014             No Other pneumonia vaccine indicated until age 89 Zostavax--not indicated until age 98. Discuss then. Shingrix--- 05/23/17----this is currently on back order. Will discuss at future visit.  At office visit 12/09/14 I discussed with patient that if labs remain stable he could wait 6 months for visit. He prefers to be seen every 3 months to closely monitor things. Routine Office visit 3 months. F/U  sooner if needed.   Marin Olp Blanchard, [place], [facility] 03/28/2018 9:24 AM

## 2018-03-29 LAB — COMPLETE METABOLIC PANEL WITH GFR
AG Ratio: 1.9 (calc) (ref 1.0–2.5)
ALT: 32 U/L (ref 9–46)
AST: 27 U/L (ref 10–35)
Albumin: 4.3 g/dL (ref 3.6–5.1)
Alkaline phosphatase (APISO): 71 U/L (ref 40–115)
BUN: 18 mg/dL (ref 7–25)
CO2: 25 mmol/L (ref 20–32)
Calcium: 9.4 mg/dL (ref 8.6–10.3)
Chloride: 105 mmol/L (ref 98–110)
Creat: 0.83 mg/dL (ref 0.70–1.33)
GFR, Est African American: 112 mL/min/{1.73_m2} (ref >=60)
GFR, Est Non African American: 97 mL/min/{1.73_m2} (ref >=60)
Globulin: 2.3 g/dL (calc) (ref 1.9–3.7)
Glucose, Bld: 95 mg/dL (ref 65–99)
Potassium: 4.3 mmol/L (ref 3.5–5.3)
Sodium: 139 mmol/L (ref 135–146)
Total Bilirubin: 0.5 mg/dL (ref 0.2–1.2)
Total Protein: 6.6 g/dL (ref 6.1–8.1)

## 2018-03-29 LAB — LIPID PANEL
Cholesterol: 129 mg/dL (ref <200)
HDL: 49 mg/dL (ref >40)
LDL Cholesterol (Calc): 67 mg/dL (calc)
Non-HDL Cholesterol (Calc): 80 mg/dL (calc) (ref <130)
Total CHOL/HDL Ratio: 2.6 (calc) (ref <5.0)
Triglycerides: 47 mg/dL (ref <150)

## 2018-03-29 LAB — MICROALBUMIN, URINE: Microalb, Ur: 1.8 mg/dL

## 2018-03-29 LAB — HEMOGLOBIN A1C
Hgb A1c MFr Bld: 5.4 % of total Hgb (ref <5.7)
Mean Plasma Glucose: 108 (calc)
eAG (mmol/L): 6 (calc)

## 2018-04-03 ENCOUNTER — Other Ambulatory Visit: Payer: Self-pay

## 2018-04-10 DIAGNOSIS — H35033 Hypertensive retinopathy, bilateral: Secondary | ICD-10-CM | POA: Diagnosis not present

## 2018-04-10 DIAGNOSIS — B0239 Other herpes zoster eye disease: Secondary | ICD-10-CM | POA: Diagnosis not present

## 2018-04-10 DIAGNOSIS — H2513 Age-related nuclear cataract, bilateral: Secondary | ICD-10-CM | POA: Diagnosis not present

## 2018-04-19 ENCOUNTER — Other Ambulatory Visit: Payer: Self-pay | Admitting: Physician Assistant

## 2018-05-07 ENCOUNTER — Ambulatory Visit: Payer: 59 | Admitting: Family Medicine

## 2018-05-07 ENCOUNTER — Other Ambulatory Visit: Payer: Self-pay

## 2018-05-07 ENCOUNTER — Encounter: Payer: Self-pay | Admitting: Family Medicine

## 2018-05-07 VITALS — BP 118/68 | HR 62 | Temp 98.5°F | Resp 14 | Ht 70.0 in | Wt 214.0 lb

## 2018-05-07 DIAGNOSIS — H01004 Unspecified blepharitis left upper eyelid: Secondary | ICD-10-CM

## 2018-05-07 DIAGNOSIS — J069 Acute upper respiratory infection, unspecified: Secondary | ICD-10-CM | POA: Diagnosis not present

## 2018-05-07 DIAGNOSIS — H6091 Unspecified otitis externa, right ear: Secondary | ICD-10-CM | POA: Diagnosis not present

## 2018-05-07 MED ORDER — NEOMYCIN-POLYMYXIN-HC 3.5-10000-1 OT SOLN: 4.0000 [drp] | Freq: Four times a day (QID) | OTIC | 0 refills | Status: AC

## 2018-05-07 MED ORDER — ERYTHROMYCIN 5 MG/GM OP OINT: 1.0000 "application " | TOPICAL_OINTMENT | Freq: Every day | OPHTHALMIC | 0 refills | Status: DC

## 2018-05-07 MED ORDER — MOMETASONE FUROATE 50 MCG/ACT NA SUSP: 2.0000 | Freq: Every day | NASAL | 12 refills | Status: DC

## 2018-05-07 MED ORDER — CETIRIZINE HCL 10 MG PO TABS: 10.0000 mg | ORAL_TABLET | Freq: Every day | ORAL | 11 refills | Status: DC

## 2018-05-07 NOTE — Progress Notes (Signed)
Patient ID: Ian Leblanc, male    DOB: 1961/05/01, 57 y.o.   MRN: [id number]  PCP: Orlena Sheldon, PA-C  Chief Complaint  Patient presents with  . Ear Pain    R ear pain  . Eye Irritation    L upper eye lid swollen    Subjective:   Ian Leblanc is a 57 y.o. male, presents to clinic with CC of right ear pain and left upper eyelid swelling Otalgia   There is pain in the right ear. This is a new problem. Episode onset: two weeks. The problem occurs constantly. The problem has been unchanged. There has been no fever. The pain is moderate (pain mild-moderate, described as very sore to ear and around ear, much worse at night when laying on his ear.). Associated symptoms include rhinorrhea. Pertinent negatives include no abdominal pain, coughing, diarrhea, ear discharge, headaches, hearing loss, neck pain, rash, sore throat or vomiting. He has tried nothing ( cleans ear every day with q-tips, made more tender when cleaning a few days ago) for the symptoms. The treatment provided no relief. There is no history of a chronic ear infection, hearing loss or a tympanostomy tube.  Eye Problem   The left (left upper eyelid) eye is affected. This is a new problem. Episode onset: two weeks. The problem occurs constantly. The problem has been gradually improving. There was no injury mechanism. The pain is mild (mild, constant, improving, conjunctiva was mildly red but improved with eye drops, mild itching associated, swelling and redness have gradually decreased). There is no known exposure to pink eye. He does not wear contacts. Associated symptoms include an eye discharge (a little crusting, no purulent discharge), eye redness and itching. Pertinent negatives include no blurred vision, double vision, fever, foreign body sensation, nausea, photophobia, recent URI, vomiting or weakness. He has tried eye drops for the symptoms. The treatment provided mild relief.   Pt is concerned that right ear pain may be  shingles, he has shingles 6 months ago and it started with ear pain prior to rash.  He has no rash.  All sx with eye and ear are associated with sinus congestion and discharge with post-nasal drip.  No sinus pain or pressure.  Right ear no drainage, redness, swelling, warmth.  No change to hearing.  No tx tried.  Pain "aching and sore"     Patient Active Problem List   Diagnosis Date Noted  . Cervical spine disease 02/04/2015  . Neck pain on left side 02/04/2015  . Paresthesia of left arm 02/04/2015  . Numbness of fingers 02/04/2015  . Screening for prostate cancer 12/09/2014  . Diabetes mellitus type 2, uncomplicated ([place]) 47/[id number]/4650  . Chest pain 07/16/2013  . Abnormal EKG 07/16/2013  . Hyperglycemia   . HTN (hypertension) 10/10/2012  . HLD (hyperlipidemia) 10/10/2012  . History of colonic polyps 10/10/2012     Prior to Admission medications   Medication Sig Start Date End Date Taking? Authorizing Provider  aspirin 81 MG tablet Take 81 mg by mouth daily.   Yes [provider]  atorvastatin (LIPITOR) 80 MG tablet TAKE 1 TABLET BY MOUTH ONCE DAILY AT  6  PM 10/09/17  Yes Dixon, Mary B, PA-C  benazepril (LOTENSIN) 20 MG tablet TAKE 1 TABLET BY MOUTH ONCE DAILY (PATIENT  DUE  FOR  OFFICE  VISIT) 04/19/18  Yes Dena Billet B, PA-C  fluticasone (FLONASE) 50 MCG/ACT nasal spray Place 2 sprays into both nostrils daily. 05/23/17  Yes Dixon,  Mary B, PA-C  gabapentin (NEURONTIN) 300 MG capsule Day 1: Take one daily. Day 2: Take one twice a day. Day 3: Take one 3 times a day.Cont. this dose. 03/28/18  Yes Dixon, Mary B, PA-C  hydrochlorothiazide (MICROZIDE) 12.5 MG capsule TAKE 1 CAPSULE BY MOUTH ONCE DAILY **PATIENT DUE FOR OFFICE VIST** 07/19/17  Yes Orlena Sheldon, PA-C  HYDROcodone-acetaminophen (NORCO/VICODIN) 5-325 MG tablet Take 1 tablet by mouth every 6 (six) hours as needed. 11/05/17  Yes Dena Billet B, PA-C  metFORMIN (GLUCOPHAGE) 1000 MG tablet TAKE 1 TABLET BY MOUTH TWICE DAILY WITH  MEALS 03/14/18  Yes Orlena Sheldon, PA-C  Multiple Vitamins-Minerals (MULTIVITAMIN PO) Take 1 tablet by mouth daily. Over 50 mvi qd   Yes [provider]  Omega-3 Fatty Acids (OMEGA-3 FISH OIL PO) Take 600 mg by mouth daily.    Yes [provider]     Allergies  Allergen Reactions  . Crestor [Rosuvastatin] Other (See Comments)    Severe myalgias     History reviewed. No pertinent family history.  Review of Systems  Constitutional: Negative.  Negative for activity change, appetite change, chills, diaphoresis, fatigue, fever and unexpected weight change.  HENT: Positive for congestion, ear pain, postnasal drip and rhinorrhea. Negative for dental problem, drooling, ear discharge, facial swelling, hearing loss, mouth sores, nosebleeds, sinus pressure, sinus pain, sneezing, sore throat, tinnitus, trouble swallowing and voice change.   Eyes: Positive for pain, discharge (a little crusting, no purulent discharge), redness and itching. Negative for blurred vision, double vision, photophobia and visual disturbance.  Respiratory: Negative.  Negative for apnea, cough, choking, chest tightness, shortness of breath, wheezing and stridor.   Cardiovascular: Negative.  Negative for chest pain.  Gastrointestinal: Negative.  Negative for abdominal pain, diarrhea, nausea and vomiting.  Endocrine: Negative.   Genitourinary: Negative.   Musculoskeletal: Negative.  Negative for neck pain.  Skin: Negative.  Negative for color change, pallor and rash.  Allergic/Immunologic: Positive for environmental allergies. Negative for immunocompromised state.  Neurological: Negative.  Negative for dizziness, syncope, facial asymmetry, weakness, light-headedness and headaches.  Hematological: Negative.   Psychiatric/Behavioral: Negative.        Objective:    Vitals:   05/07/18 0805  BP: 118/68  Pulse: 62  Resp: 14  Temp: 98.5 F (36.9 C)  TempSrc: Oral  SpO2: 96%  Weight: 214 lb (97.1 kg)    Height: 5\' 10"  (1.778 m)    Physical Exam  Constitutional: He appears well-developed and well-nourished. No distress.  HENT:  Head: Normocephalic and atraumatic. Head is without right periorbital erythema and without left periorbital erythema.  Right Ear: Hearing and tympanic membrane normal. There is swelling and tenderness. No drainage. No mastoid tenderness. Tympanic membrane is not injected. No middle ear effusion. No decreased hearing is noted.  Left Ear: Hearing, tympanic membrane, external ear and ear canal normal. No drainage, swelling or tenderness. No mastoid tenderness. Tympanic membrane is not injected.  No middle ear effusion. No decreased hearing is noted.  Nose: Mucosal edema present. Right sinus exhibits no maxillary sinus tenderness and no frontal sinus tenderness. Left sinus exhibits no maxillary sinus tenderness and no frontal sinus tenderness.  Mouth/Throat: Uvula is midline, oropharynx is clear and moist and mucous membranes are normal.  Right external ear mild tragus edema when compared to left, mild ttp to tragus and with manipulation of pinna.  Scattered normal appearing cerumen to right EAC with mild canal edema and moderate erythema. No rash to face Mild nasal edema  and erythema   Eyes: Pupils are equal, round, and reactive to light. Conjunctivae and EOM are normal. Right eye exhibits no chemosis, no discharge and no exudate. No foreign body present in the right eye. Left eye exhibits hordeolum. Left eye exhibits no chemosis, no discharge and no exudate. No foreign body present in the left eye. Right conjunctiva is not injected. Right conjunctiva has no hemorrhage. Left conjunctiva is not injected. Left conjunctiva has no hemorrhage. No scleral icterus. Right eye exhibits normal extraocular motion and no nystagmus. Left eye exhibits normal extraocular motion and no nystagmus. Right pupil is round and reactive. Left pupil is round and reactive. Pupils are equal.    Left  upper eyelid with 1 cm area of mild erythema and edema, no fluctuance, no visible pustule or drainage, mildly ttp  Neck: No tracheal deviation present.  Cardiovascular: Normal rate and regular rhythm.  Pulmonary/Chest: Effort normal. No stridor. No respiratory distress.  Musculoskeletal: Normal range of motion.  Neurological: He is alert. He exhibits normal muscle tone. Coordination normal.  Skin: Skin is warm and dry. No rash noted. He is not diaphoretic.  Psychiatric: He has a normal mood and affect. His behavior is normal.  Nursing note and vitals reviewed.         Assessment & Plan:      ICD-10-CM   1. Otitis externa of right ear, unspecified chronicity, unspecified type H60.91    likely secondary to Q-tip use - D/C q-tips, cortisporin drops  2. Blepharitis of left upper eyelid, unspecified type H01.004    area of swelling and redness - blepharitis vs hordeolum vs allergies?  Warm soaks, if no improvement erythromycin ointment to lash line, allergy meds to help intermittent itching - f/up if not improving.  F/up ASAP with any spreading redness, new pain, visual disturbances - pt agrees to plan and verb understanding of f/up precautions.  3. Upper respiratory tract infection, unspecified type J06.9    suspect mild viral URI, supportive and sx tx       Delsa Grana, PA-C 05/07/18 8:16 AM

## 2018-05-07 NOTE — Patient Instructions (Addendum)
Ear drops to right ear - do not use q-tips  To remove wax try debrox drops - over the counter - and gentle irrigation with warm water  Left upper eyelid - blepharitis vs stye  do warm compresses several times a day and gentle exfoliation.   Can use erytheromycin ophthalmic ointment applying once daily to upper lash line  If you have itching and symptoms do not improve with allergy meds, let us know, we can try a prescription eye drop.  But you can also try over the counter allergy eye drops  Please come to be rechecked with any swelling, redness spreading around eyes, with severe pain, fever or change to vision.    Stye A stye is a bump on your eyelid caused by a bacterial infection. A stye can form inside the eyelid (internal stye) or outside the eyelid (external stye). An internal stye may be caused by an infected oil-producing gland inside your eyelid. An external stye may be caused by an infection at the base of your eyelash (hair follicle). Styes are very common. Anyone can get them at any age. They usually occur in just one eye, but you may have more than one in either eye. What are the causes? The infection is almost always caused by bacteria called Staphylococcus aureus. This is a common type of bacteria that lives on your skin. What increases the risk? You may be at higher risk for a stye if you have had one before. You may also be at higher risk if you have:  Diabetes.  Long-term illness.  Long-term eye redness.  A skin condition called seborrhea.  High fat levels in your blood (lipids).  What are the signs or symptoms? Eyelid pain is the most common symptom of a stye. Internal styes are more painful than external styes. Other signs and symptoms may include:  Painful swelling of your eyelid.  A scratchy feeling in your eye.  Tearing and redness of your eye.  Pus draining from the stye.  How is this diagnosed? Your health care provider may be able to diagnose a stye  just by examining your eye. The health care provider may also check to make sure:  You do not have a fever or other signs of a more serious infection.  The infection has not spread to other parts of your eye or areas around your eye.  How is this treated? Most styes will clear up in a few days without treatment. In some cases, you may need to use antibiotic drops or ointment to prevent infection. Your health care provider may have to drain the stye surgically if your stye is:  Large.  Causing a lot of pain.  Interfering with your vision.  This can be done using a thin blade or a needle. Follow these instructions at home:  Take medicines only as directed by your health care provider.  Apply a clean, warm compress to your eye for 10 minutes, 4 times a day.  Do not wear contact lenses or eye makeup until your stye has healed.  Do not try to pop or drain the stye. Contact a health care provider if:  You have chills or a fever.  Your stye does not go away after several days.  Your stye affects your vision.  Your eyeball becomes swollen, red, or painful. This information is not intended to replace advice given to you by your health care provider. Make sure you discuss any questions you have with your health care  provider. Document Released: 05/17/2005 Document Revised: 04/02/2016 Document Reviewed: 11/21/2013 Elsevier Interactive Patient Education  2018 [place].  Blepharitis Blepharitis means swollen eyelids. Follow these instructions at home: Pay attention to any changes in how you look or feel. Follow these instructions to help with your condition: Keeping Clean  Wash your hands often.  Wash your eyelids with warm water, or wash them with warm water that is mixed with little bit of baby shampoo. Do this 2 or more times per day.  Wash your face and eyebrows at least once a day.  Use a clean towel each time you dry your eyelids. Do not use the towel to clean or dry  other areas of your body. Do not share your towel with anyone. General instructions  Avoid wearing makeup until you get better. Do not share makeup with anyone.  Avoid rubbing your eyes.  Put a warm compress on your eyes 2 times per day for 10 minutes at a time or as told by your doctor.  If you were told to use an medicated cream or eye drops, use the medicine as told by your doctor. Do not stop using the medicine even if you feel better.  Keep all follow-up visits as told by your doctor. This is important. Contact a doctor if:  Your eyelids feel hot.  You have blisters on your eyelids.  You have a rash on your eyelids.  The swelling does not go away in 2-4 days.  The swelling gets worse. Get help right away if:  You have pain that gets worse.  You have pain that spreads to other parts of your face.  You have redness that gets worse.  You have redness that spreads to other parts of your face.  Your vision changes.  You have pain when you look at lights or things that move.  You have a fever. This information is not intended to replace advice given to you by your health care provider. Make sure you discuss any questions you have with your health care provider. Document Released: 05/16/2008 Document Revised: 01/13/2016 Document Reviewed: 11/30/2014 Elsevier Interactive Patient Education  2018 [employer].   Otitis Externa Otitis externa is an infection of the outer ear canal. The outer ear canal is the area between the outside of the ear and the eardrum. Otitis externa is sometimes called "swimmer's ear." Follow these instructions at home:  If you were given antibiotic ear drops, use them as told by your doctor. Do not stop using them even if your condition gets better.  Take over-the-counter and prescription medicines only as told by your doctor.  Keep all follow-up visits as told by your doctor. This is important. How is this prevented?  Keep your ear dry. Use  the corner of a towel to dry your ear after you swim or bathe.  Try not to scratch or put things in your ear. Doing these things makes it easier for germs to grow in your ear.  Avoid swimming in lakes, dirty water, or pools that may not have the right amount of a chemical called chlorine.  Consider making ear drops and putting 3 or 4 drops in each ear after you swim. Ask your doctor about how you can make ear drops. Contact a doctor if:  You have a fever.  After 3 days your ear is still red, swollen, or painful.  After 3 days you still have pus coming from your ear.  Your redness, swelling, or pain gets worse.  You have a really bad headache.  You have redness, swelling, pain, or tenderness behind your ear. This information is not intended to replace advice given to you by your health care provider. Make sure you discuss any questions you have with your health care provider. Document Released: 01/24/2008 Document Revised: 09/02/2015 Document Reviewed: 05/17/2015 Elsevier Interactive Patient Education  [employer].

## 2018-05-27 ENCOUNTER — Other Ambulatory Visit: Payer: Self-pay | Admitting: Physician Assistant

## 2018-06-05 DIAGNOSIS — Z23 Encounter for immunization: Secondary | ICD-10-CM | POA: Diagnosis not present

## 2018-06-26 ENCOUNTER — Ambulatory Visit: Payer: 59 | Admitting: Family Medicine

## 2018-06-26 ENCOUNTER — Ambulatory Visit: Payer: 59 | Admitting: Physician Assistant

## 2018-06-26 ENCOUNTER — Encounter: Payer: Self-pay | Admitting: Family Medicine

## 2018-06-26 ENCOUNTER — Other Ambulatory Visit: Payer: Self-pay

## 2018-06-26 VITALS — BP 124/76 | HR 68 | Temp 98.2°F | Resp 16 | Ht 70.0 in | Wt 202.0 lb

## 2018-06-26 DIAGNOSIS — I10 Essential (primary) hypertension: Secondary | ICD-10-CM

## 2018-06-26 DIAGNOSIS — E663 Overweight: Secondary | ICD-10-CM | POA: Diagnosis not present

## 2018-06-26 DIAGNOSIS — E119 Type 2 diabetes mellitus without complications: Secondary | ICD-10-CM

## 2018-06-26 DIAGNOSIS — E782 Mixed hyperlipidemia: Secondary | ICD-10-CM

## 2018-06-26 DIAGNOSIS — R399 Unspecified symptoms and signs involving the genitourinary system: Secondary | ICD-10-CM

## 2018-06-26 NOTE — Assessment & Plan Note (Signed)
Doing well with medication, no side effects, no glycemia, check CMP A1C, consider decreasing meds again if able

## 2018-06-26 NOTE — Progress Notes (Signed)
Patient ID: Ian Leblanc, male    DOB: 1961-06-22, 57 y.o.   MRN: [id number]  PCP: Delsa Grana, PA-C  Chief Complaint  Patient presents with  . Follow-up    is fasting- HLD/HTN/DM    Subjective:   Ian Leblanc is a 57 y.o. male, presents to clinic with CC of DM, HLD, HTN and obesity - now with weight loss is not obese, BMI 29.0 -overweight.  He is here for routine follow up.  Diabetes Mellitus Type II, Follow-up:   Lab Results  Component Value Date   HGBA1C 5.4 03/28/2018   HGBA1C 5.9 (H) 05/23/2017   HGBA1C 6.4 (H) 10/05/2016   Last seen for diabetes 3 months ago.  Management since then includes metformin 100 mg BID, stopped taking actos. He reports excellent compliance with treatment. He is not having side effects.  Current symptoms include none  Episodes of hypoglycemia? no Weight trend: decreasing steadily - see below Current diet: healthy, grilled foods, cut out some carbs Current exercise: running/ jogging   Hypertension, follow-up:  BP Readings from Last 3 Encounters:  06/26/18 124/76  05/07/18 118/68  03/28/18 130/90    He was last seen for hypertension 3 months ago.  BP at that visit was 118/68. Management since that visit includes lotensin and HCTZ.He reports excellent compliance with treatment. He is not having side effects.  He is exercising. Outside blood pressures are not monitored He is experiencing none.  Patient denies chest pain, chest pressure/discomfort, claudication, dyspnea, exertional chest pressure/discomfort, fatigue, irregular heart beat, lower extremity edema, near-syncope, orthopnea, palpitations, paroxysmal nocturnal dyspnea, syncope and tachypnea.   Cardiovascular risk factors include advanced age (older than 67 for men, 63 for women), diabetes mellitus, dyslipidemia, hypertension and male gender.  Use of agents associated with hypertension: none.    Lipid/Cholesterol, Follow-up:   Last seen for this 3 months ago.    Management since that visit includes lipitor 80mg .  Last Lipid Panel:    Component Value Date/Time   CHOL 129 03/28/2018 0951   TRIG 47 03/28/2018 0951   HDL 49 03/28/2018 0951   CHOLHDL 2.6 03/28/2018 0951   VLDL 20 07/05/2016 0843   LDLCALC 67 03/28/2018 0951    He reports excellent compliance with treatment. He is not having side effects. No myalgias  Wt Readings from Last 3 Encounters:  06/26/18 202 lb (91.6 kg)  05/07/18 214 lb (97.1 kg)  03/28/18 215 lb 6.4 oz (97.7 kg)   Pt wants to decrease meds if possible.  He can monitor his BP at home and at work if we change meds  He has some mild groin soreness with trying to increase his speed with running and jogging He complains about ear pain at night, outer ear gets sore, no swelling, redness, no hearing change, no drainage He has something on the bottom of his left foot that bothers him sometimes  He also mentions that since making diet changes and increasing exercise, he is getting up more frequently at night to urinate since very small amounts, does not achieve a good urine stream he denies any dysuria, hematuria.  He states he is drinking a lot more water.  His last PSA was done with his physical almost 3 months ago and was normal.     Patient Active Problem List   Diagnosis Date Noted  . Cervical spine disease 02/04/2015  . Neck pain on left side 02/04/2015  . Paresthesia of left arm 02/04/2015  . Numbness of fingers 02/04/2015  .  Screening for prostate cancer 12/09/2014  . Diabetes mellitus type 2, uncomplicated ([place]) 53/61/4431  . Chest pain 07/16/2013  . Abnormal EKG 07/16/2013  . Hyperglycemia   . HTN (hypertension) 10/10/2012  . HLD (hyperlipidemia) 10/10/2012  . History of colonic polyps 10/10/2012    Current Meds  Medication Sig  . aspirin 81 MG tablet Take 81 mg by mouth daily.  Marland Kitchen atorvastatin (LIPITOR) 80 MG tablet TAKE 1 TABLET BY MOUTH ONCE DAILY AT  6  PM  . benazepril (LOTENSIN) 20 MG tablet  TAKE 1 TABLET BY MOUTH ONCE DAILY *PATIENT  DUE  FOR  OFFICE  VISIT*  . cetirizine (ZYRTEC) 10 MG tablet Take 1 tablet (10 mg total) by mouth daily.  . fluticasone (FLONASE) 50 MCG/ACT nasal spray Place 2 sprays into both nostrils daily.  Marland Kitchen gabapentin (NEURONTIN) 300 MG capsule Day 1: Take one daily. Day 2: Take one twice a day. Day 3: Take one 3 times a day.Cont. this dose.  . hydrochlorothiazide (MICROZIDE) 12.5 MG capsule TAKE 1 CAPSULE BY MOUTH ONCE DAILY **PATIENT DUE FOR OFFICE VIST**  . HYDROcodone-acetaminophen (NORCO/VICODIN) 5-325 MG tablet Take 1 tablet by mouth every 6 (six) hours as needed.  . metFORMIN (GLUCOPHAGE) 1000 MG tablet TAKE 1 TABLET BY MOUTH TWICE DAILY WITH MEALS  . Multiple Vitamins-Minerals (MULTIVITAMIN PO) Take 1 tablet by mouth daily. Over 50 mvi qd  . Omega-3 Fatty Acids (OMEGA-3 FISH OIL PO) Take 600 mg by mouth daily.      Review of Systems  Constitutional: Negative.  Negative for activity change, appetite change, fatigue and unexpected weight change.  HENT: Negative.   Eyes: Negative.   Respiratory: Negative.  Negative for shortness of breath.   Cardiovascular: Negative.  Negative for chest pain, palpitations and leg swelling.  Gastrointestinal: Negative.  Negative for abdominal pain and blood in stool.  Endocrine: Negative.   Genitourinary: Negative.  Negative for decreased urine volume, difficulty urinating, testicular pain and urgency.  Skin: Negative.  Negative for color change and pallor.  Allergic/Immunologic: Negative.   Neurological: Negative.  Negative for syncope, weakness, light-headedness and numbness.  Psychiatric/Behavioral: Negative.  Negative for confusion, dysphoric mood, self-injury and suicidal ideas. The patient is not nervous/anxious.   All other systems reviewed and are negative.      Objective:    Vitals:   06/26/18 0818  BP: 124/76  Pulse: 68  Resp: 16  Temp: 98.2 F (36.8 C)  TempSrc: Oral  SpO2: 96%  Weight: 202 lb  (91.6 kg)  Height: 5\' 10"  (1.778 m)      Physical Exam  Constitutional: He is oriented to person, place, and time. He appears well-developed and well-nourished.  Non-toxic appearance. He does not appear ill. No distress.  HENT:  Head: Normocephalic and atraumatic.  Right Ear: Hearing, tympanic membrane, external ear and ear canal normal.  Left Ear: Hearing, tympanic membrane, external ear and ear canal normal.  Nose: Nose normal. No mucosal edema or rhinorrhea. Right sinus exhibits no maxillary sinus tenderness and no frontal sinus tenderness. Left sinus exhibits no maxillary sinus tenderness and no frontal sinus tenderness.  Mouth/Throat: Uvula is midline and oropharynx is clear and moist. No trismus in the jaw. No uvula swelling. No oropharyngeal exudate, posterior oropharyngeal edema or posterior oropharyngeal erythema.  Eyes: Pupils are equal, round, and reactive to light. Conjunctivae, EOM and lids are normal. No scleral icterus.  Neck: Trachea normal, normal range of motion and phonation normal. Neck supple. No tracheal deviation present.  Cardiovascular: Normal  rate, regular rhythm, normal heart sounds, intact distal pulses and normal pulses. PMI is not displaced. Exam reveals no gallop and no friction rub.  No murmur heard. Pulses:      Radial pulses are 2+ on the right side, and 2+ on the left side.       Posterior tibial pulses are 2+ on the right side, and 2+ on the left side.  No LE edema  Pulmonary/Chest: Effort normal and breath sounds normal. No stridor. No respiratory distress. He has no wheezes. He has no rhonchi. He has no rales.  Abdominal: Soft. Normal appearance and bowel sounds are normal. He exhibits no distension and no mass. There is no tenderness. There is no rebound and no guarding.  Musculoskeletal: Normal range of motion. He exhibits no edema.  Neurological: He is alert and oriented to person, place, and time. He exhibits normal muscle tone. Coordination and gait  normal.  Skin: Skin is warm, dry and intact. Capillary refill takes less than 2 seconds. No rash noted. He is not diaphoretic.  Psychiatric: He has a normal mood and affect. His speech is normal and behavior is normal. Judgment and thought content normal.  Nursing note and vitals reviewed.         Assessment & Plan:   Problem List Items Addressed This Visit      Cardiovascular and Mediastinum   HTN (hypertension) (Chronic)    At goal today, well controlled Recheck CMP/rernal function Continue current meds however with weight changes in lifestyle changes may be able to d/c hydrochlorthiazide, will wait for labs      Relevant Orders   COMPLETE METABOLIC PANEL WITH GFR     Endocrine   Diabetes mellitus type 2, uncomplicated ([place]) - Primary    Doing well with medication, no side effects, no glycemia, check CMP A1C, consider decreasing meds again if able      Relevant Orders   COMPLETE METABOLIC PANEL WITH GFR   Hemoglobin A1c   Lipid panel   Ambulatory referral to Urology     Other   HLD (hyperlipidemia) (Chronic)    Tolerating statin without side effects, recheck liver function and fasting lipid panel, con't meds and lifestyle changes      Relevant Orders   COMPLETE METABOLIC PANEL WITH GFR   Hemoglobin A1c   Lipid panel    Other Visit Diagnoses    Overweight (BMI 25.0-29.9)       Commended on weight loss, continue lifestyle changes and exercise   Relevant Orders   COMPLETE METABOLIC PANEL WITH GFR   Hemoglobin A1c   Lipid panel   Lower urinary tract symptoms (LUTS)       Relevant Orders   Ambulatory referral to Urology      Referral to urology for LUTS, do not suspect its from DM/hyperglycemia and autodiuresing, his labs have drastically improved this year.   PSA normal a few months ago.  Feel urology referral is appropriate  Patient would like to get off meds as much as possible, will recheck labs and see what is feasible      Delsa Grana, PA-C 06/26/18  8:33 AM

## 2018-06-26 NOTE — Assessment & Plan Note (Signed)
Tolerating statin without side effects, recheck liver function and fasting lipid panel, con't meds and lifestyle changes

## 2018-06-26 NOTE — Assessment & Plan Note (Signed)
At goal today, well controlled Recheck CMP/rernal function Continue current meds however with weight changes in lifestyle changes may be able to d/c hydrochlorthiazide, will wait for labs

## 2018-06-27 ENCOUNTER — Other Ambulatory Visit: Payer: Self-pay | Admitting: Physician Assistant

## 2018-06-27 LAB — COMPLETE METABOLIC PANEL WITH GFR
AG Ratio: 2 (calc) (ref 1.0–2.5)
ALT: 35 U/L (ref 9–46)
AST: 28 U/L (ref 10–35)
Albumin: 4.4 g/dL (ref 3.6–5.1)
Alkaline phosphatase (APISO): 79 U/L (ref 40–115)
BUN: 16 mg/dL (ref 7–25)
CO2: 28 mmol/L (ref 20–32)
Calcium: 9.5 mg/dL (ref 8.6–10.3)
Chloride: 101 mmol/L (ref 98–110)
Creat: 0.78 mg/dL (ref 0.70–1.33)
GFR, Est African American: 115 mL/min/{1.73_m2} (ref >=60)
GFR, Est Non African American: 99 mL/min/{1.73_m2} (ref >=60)
Globulin: 2.2 g/dL (calc) (ref 1.9–3.7)
Glucose, Bld: 89 mg/dL (ref 65–99)
Potassium: 4.6 mmol/L (ref 3.5–5.3)
Sodium: 138 mmol/L (ref 135–146)
Total Bilirubin: 0.5 mg/dL (ref 0.2–1.2)
Total Protein: 6.6 g/dL (ref 6.1–8.1)

## 2018-06-27 LAB — LIPID PANEL
Cholesterol: 128 mg/dL (ref <200)
HDL: 40 mg/dL — ABNORMAL LOW (ref >40)
LDL Cholesterol (Calc): 74 mg/dL (calc)
Non-HDL Cholesterol (Calc): 88 mg/dL (calc) (ref <130)
Total CHOL/HDL Ratio: 3.2 (calc) (ref <5.0)
Triglycerides: 64 mg/dL (ref <150)

## 2018-06-27 LAB — HEMOGLOBIN A1C
Hgb A1c MFr Bld: 5.5 % of total Hgb (ref <5.7)
Mean Plasma Glucose: 111 (calc)
eAG (mmol/L): 6.2 (calc)

## 2018-06-28 ENCOUNTER — Other Ambulatory Visit: Payer: Self-pay | Admitting: *Deleted

## 2018-06-28 DIAGNOSIS — I10 Essential (primary) hypertension: Secondary | ICD-10-CM

## 2018-06-28 DIAGNOSIS — E782 Mixed hyperlipidemia: Secondary | ICD-10-CM

## 2018-06-28 DIAGNOSIS — E119 Type 2 diabetes mellitus without complications: Secondary | ICD-10-CM

## 2018-06-28 MED ORDER — METFORMIN HCL 1000 MG PO TABS: 1000.0000 mg | ORAL_TABLET | Freq: Every day | ORAL | 11 refills | Status: DC

## 2018-07-01 ENCOUNTER — Ambulatory Visit: Payer: 59 | Admitting: Physician Assistant

## 2018-07-08 DIAGNOSIS — Z1211 Encounter for screening for malignant neoplasm of colon: Secondary | ICD-10-CM | POA: Diagnosis not present

## 2018-08-01 DIAGNOSIS — K5730 Diverticulosis of large intestine without perforation or abscess without bleeding: Secondary | ICD-10-CM | POA: Diagnosis not present

## 2018-08-01 DIAGNOSIS — Z1211 Encounter for screening for malignant neoplasm of colon: Secondary | ICD-10-CM | POA: Diagnosis not present

## 2018-08-01 LAB — HM COLONOSCOPY

## 2018-08-03 ENCOUNTER — Other Ambulatory Visit: Payer: Self-pay | Admitting: Family Medicine

## 2018-08-22 ENCOUNTER — Encounter: Payer: Self-pay | Admitting: *Deleted

## 2018-08-30 DIAGNOSIS — N400 Enlarged prostate without lower urinary tract symptoms: Secondary | ICD-10-CM | POA: Diagnosis not present

## 2018-09-30 ENCOUNTER — Other Ambulatory Visit: Payer: 59

## 2018-09-30 DIAGNOSIS — E782 Mixed hyperlipidemia: Secondary | ICD-10-CM | POA: Diagnosis not present

## 2018-09-30 DIAGNOSIS — E119 Type 2 diabetes mellitus without complications: Secondary | ICD-10-CM | POA: Diagnosis not present

## 2018-09-30 DIAGNOSIS — I10 Essential (primary) hypertension: Secondary | ICD-10-CM

## 2018-10-01 LAB — LIPID PANEL
Cholesterol: 123 mg/dL (ref <200)
HDL: 45 mg/dL (ref >=40)
LDL Cholesterol (Calc): 64 mg/dL (calc)
Non-HDL Cholesterol (Calc): 78 mg/dL (calc) (ref <130)
Total CHOL/HDL Ratio: 2.7 (calc) (ref <5.0)
Triglycerides: 64 mg/dL (ref <150)

## 2018-10-01 LAB — HEMOGLOBIN A1C
Hgb A1c MFr Bld: 5.3 % of total Hgb (ref <5.7)
Mean Plasma Glucose: 105 (calc)
eAG (mmol/L): 5.8 (calc)

## 2018-10-01 LAB — COMPLETE METABOLIC PANEL WITH GFR
AG Ratio: 1.9 (calc) (ref 1.0–2.5)
ALT: 30 U/L (ref 9–46)
AST: 27 U/L (ref 10–35)
Albumin: 4.3 g/dL (ref 3.6–5.1)
Alkaline phosphatase (APISO): 72 U/L (ref 35–144)
BUN: 15 mg/dL (ref 7–25)
CO2: 25 mmol/L (ref 20–32)
Calcium: 9.5 mg/dL (ref 8.6–10.3)
Chloride: 104 mmol/L (ref 98–110)
Creat: 0.72 mg/dL (ref 0.70–1.33)
GFR, Est African American: 118 mL/min/{1.73_m2} (ref >=60)
GFR, Est Non African American: 102 mL/min/{1.73_m2} (ref >=60)
Globulin: 2.3 g/dL (calc) (ref 1.9–3.7)
Glucose, Bld: 88 mg/dL (ref 65–99)
Potassium: 4.6 mmol/L (ref 3.5–5.3)
Sodium: 139 mmol/L (ref 135–146)
Total Bilirubin: 0.5 mg/dL (ref 0.2–1.2)
Total Protein: 6.6 g/dL (ref 6.1–8.1)

## 2018-10-01 LAB — CBC WITH DIFFERENTIAL/PLATELET
Absolute Monocytes: 462 cells/uL (ref 200–950)
Basophils Absolute: 72 cells/uL (ref 0–200)
Basophils Relative: 1.1 %
Eosinophils Absolute: 286 cells/uL (ref 15–500)
Eosinophils Relative: 4.4 %
HCT: 42 % (ref 38.5–50.0)
Hemoglobin: 14 g/dL (ref 13.2–17.1)
Lymphs Abs: 1489 cells/uL (ref 850–3900)
MCH: 28.9 pg (ref 27.0–33.0)
MCHC: 33.3 g/dL (ref 32.0–36.0)
MCV: 86.6 fL (ref 80.0–100.0)
MPV: 12.4 fL (ref 7.5–12.5)
Monocytes Relative: 7.1 %
Neutro Abs: 4193 cells/uL (ref 1500–7800)
Neutrophils Relative %: 64.5 %
Platelets: 171 10*3/uL (ref 140–400)
RBC: 4.85 10*6/uL (ref 4.20–5.80)
RDW: 12.6 % (ref 11.0–15.0)
Total Lymphocyte: 22.9 %
WBC: 6.5 10*3/uL (ref 3.8–10.8)

## 2018-10-16 DIAGNOSIS — H2513 Age-related nuclear cataract, bilateral: Secondary | ICD-10-CM | POA: Diagnosis not present

## 2018-10-16 DIAGNOSIS — B0239 Other herpes zoster eye disease: Secondary | ICD-10-CM | POA: Diagnosis not present

## 2018-10-16 DIAGNOSIS — H35033 Hypertensive retinopathy, bilateral: Secondary | ICD-10-CM | POA: Diagnosis not present

## 2018-10-21 ENCOUNTER — Other Ambulatory Visit: Payer: Self-pay

## 2018-10-21 MED ORDER — ATORVASTATIN CALCIUM 80 MG PO TABS: ORAL_TABLET | ORAL | 1 refills | Status: DC

## 2019-01-28 ENCOUNTER — Other Ambulatory Visit: Payer: Self-pay

## 2019-01-28 ENCOUNTER — Encounter: Payer: Self-pay | Admitting: Family Medicine

## 2019-01-28 ENCOUNTER — Ambulatory Visit: Payer: 59 | Admitting: Family Medicine

## 2019-01-28 VITALS — BP 120/62 | HR 61 | Temp 98.4°F | Resp 18 | Ht 70.0 in | Wt 203.0 lb

## 2019-01-28 DIAGNOSIS — M542 Cervicalgia: Secondary | ICD-10-CM | POA: Diagnosis not present

## 2019-01-28 DIAGNOSIS — S46812A Strain of other muscles, fascia and tendons at shoulder and upper arm level, left arm, initial encounter: Secondary | ICD-10-CM | POA: Diagnosis not present

## 2019-01-28 MED ORDER — NAPROXEN 500 MG PO TABS: 500.0000 mg | ORAL_TABLET | Freq: Two times a day (BID) | ORAL | 0 refills | Status: DC

## 2019-01-28 MED ORDER — CYCLOBENZAPRINE HCL 5 MG PO TABS: 5.0000 mg | ORAL_TABLET | Freq: Three times a day (TID) | ORAL | 1 refills | Status: DC | PRN

## 2019-01-28 NOTE — Progress Notes (Signed)
Patient ID: Ian Leblanc, male    DOB: 30-Jul-1961, 58 y.o.   MRN: [id number]  PCP: Ian Grana, PA-C  Chief Complaint  Patient presents with  . 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 male, presents to clinic with CC of neck pain and stiffness for a few weeks.  He believes he "tweeked it" doing something minor, he denies trauma, injury, specific strain.   Limited rotation to the left, pain worse at night or when at rest, feels better when he's jogging.  He's tried 3 different topical creams, ibuprofen and heating pad with no relief.  Hes working from home with different set up but he's used a head set and recently tried to put his computer screen up higher so he's not looking down. Described as tight, stiff, achy, moderate severity, fairly constant pain. He denies any radiation of pain down arm.  Mostly in posterior left neck and in left upper trapezius.   Patient Active Problem List   Diagnosis Date Noted  . Cervical spine disease 02/04/2015  . Neck pain on left side 02/04/2015  . Paresthesia of left arm 02/04/2015  . Numbness of fingers 02/04/2015  . Screening for prostate cancer 12/09/2014  . Diabetes mellitus type 2, uncomplicated ([place]) 85/09/7739  . Chest pain 07/16/2013  . Abnormal EKG 07/16/2013  . Hyperglycemia   . HTN (hypertension) 10/10/2012  . HLD (hyperlipidemia) 10/10/2012  . History of colonic polyps 10/10/2012     Prior to Admission medications   Medication Sig Start Date End Date Taking? Authorizing Provider  aspirin 81 MG tablet Take 81 mg by mouth daily.   Yes [provider]  atorvastatin (LIPITOR) 80 MG tablet TAKE 1 TABLET BY MOUTH ONCE DAILY AT  6  PM 10/21/18  Yes Ian Grana, PA-C  benazepril (LOTENSIN) 20 MG tablet Take 1 tablet (20 mg total) by mouth daily. 08/05/18  Yes Ian Grana, PA-C  cetirizine (ZYRTEC) 10 MG tablet Take 1 tablet (10 mg  total) by mouth daily. 05/07/18  Yes Ian Grana, PA-C  fluticasone (FLONASE) 50 MCG/ACT nasal spray Place 2 sprays into both nostrils daily. 05/23/17  Yes Dena Billet B, PA-C  gabapentin (NEURONTIN) 300 MG capsule Day 1: Take one daily. Day 2: Take one twice a day. Day 3: Take one 3 times a day.Cont. this dose. 03/28/18  Yes Orlena Sheldon, PA-C  metFORMIN (GLUCOPHAGE) 1000 MG tablet Take 1 tablet (1,000 mg total) by mouth daily with breakfast. 06/28/18  Yes Ian Grana, PA-C  Multiple Vitamins-Minerals (MULTIVITAMIN PO) Take 1 tablet by mouth daily. Over 50 mvi qd   Yes [provider]  Omega-3 Fatty Acids (OMEGA-3 FISH OIL PO) Take 600 mg by mouth daily.    Yes [provider]     Allergies  Allergen Reactions  . Crestor [Rosuvastatin] Other (See Comments)    Severe myalgias     No family history on file.   Social History   Socioeconomic History  . Marital status: Married    Spouse name: Not on file  . Number of children: Not on file  . Years of education: Not on file  . Highest education level: Not on file  Occupational History  . Not on file  Social Needs  . Financial resource strain: Not on file  . Food insecurity:    Worry: Not on file  Inability: Not on file  . Transportation needs:    Medical: Not on file    Non-medical: Not on file  Tobacco Use  . Smoking status: Former Smoker    Last attempt to quit: 07/17/2003    Years since quitting: 15.5  . Smokeless tobacco: Never Used  Substance and Sexual Activity  . Alcohol use: No  . Drug use: No  . Sexual activity: Not on file  Lifestyle  . Physical activity:    Days per week: Not on file    Minutes per session: Not on file  . Stress: Not on file  Relationships  . Social connections:    Talks on phone: Not on file    Gets together: Not on file    Attends religious service: Not on file    Active member of club or organization: Not on file    Attends meetings of clubs or organizations: Not on  file    Relationship status: Not on file  . Intimate partner violence:    Fear of current or ex partner: Not on file    Emotionally abused: Not on file    Physically abused: Not on file    Forced sexual activity: Not on file  Other Topics Concern  . Not on file  Social History Narrative  . Not on file     Review of Systems  Constitutional: Negative.   HENT: Negative.   Eyes: Negative.   Respiratory: Negative.   Cardiovascular: Negative.   Gastrointestinal: Negative.   Endocrine: Negative.   Genitourinary: Negative.   Musculoskeletal: Positive for neck pain and neck stiffness. Negative for arthralgias, back pain, gait problem and joint swelling.  Skin: Negative.  Negative for color change, rash and wound.  Allergic/Immunologic: Negative.   Neurological: Negative.  Negative for dizziness, weakness, numbness and headaches.  Hematological: Negative.  Negative for adenopathy.  Psychiatric/Behavioral: Negative.   All other systems reviewed and are negative.      Objective:    Vitals:   01/28/19 1602  BP: 120/62  Pulse: 61  Resp: 18  Temp: 98.4 F (36.9 C)  SpO2: 96%  Weight: 203 lb (92.1 kg)  Height: 5\' 10"  (1.778 m)      Physical Exam Vitals signs and nursing note reviewed.  Constitutional:      Appearance: He is well-developed.  HENT:     Head: Normocephalic and atraumatic.     Nose: Nose normal.  Eyes:     General:        Right eye: No discharge.        Left eye: No discharge.     Conjunctiva/sclera: Conjunctivae normal.  Neck:     Musculoskeletal: Decreased range of motion. No crepitus, pain with movement, spinous process tenderness or muscular tenderness.     Trachea: No tracheal deviation.     Comments: Decreased neck extension and rotation to the left (about 45 degrees) normal flexion and rotation to the right Cardiovascular:     Rate and Rhythm: Normal rate and regular rhythm.  Pulmonary:     Effort: Pulmonary effort is normal. No respiratory  distress.     Breath sounds: No stridor.  Musculoskeletal:     Left shoulder: He exhibits normal range of motion, no tenderness, no bony tenderness, no swelling, no effusion, no crepitus, no deformity, no pain, normal pulse and normal strength.     Cervical back: He exhibits no tenderness, no bony tenderness, no swelling, no edema, no deformity, no laceration, no spasm and  normal pulse.     Comments: Left shoulder and scapula at rest higher than right, scapula more prominent on left than right. No muscle ttp to trapezius b/l, to cervical and thoracic paraspinal muscles, no midline tenderness or stepoff Around 5th to 6th thoracic spine just left of midline 3 cm indented scar  Skin:    General: Skin is warm and dry.     Findings: No rash.  Neurological:     Mental Status: He is alert.     Motor: No abnormal muscle tone.     Coordination: Coordination normal.     Comments: 5/5 symmetrical grip strength to b/l upper extremities Normal sensation to light touch b/l to all nerve distributions in arm/hand  Psychiatric:        Behavior: Behavior normal.           Assessment & Plan:      ICD-10-CM   1. Acute neck pain M54.2 naproxen (NAPROSYN) 500 MG tablet    cyclobenzaprine (FLEXERIL) 5 MG tablet    Ambulatory referral to Physical Therapy   acute muscle strain with some underlying chronic cervical spine arthritis? or DDD, limited ROM, but not all new, PT, NSAIDS, muscle relaxers, rest 2 wks  2. Trapezius strain, left, initial encounter S46.812A naproxen (NAPROSYN) 500 MG tablet    cyclobenzaprine (FLEXERIL) 5 MG tablet    Ambulatory referral to Physical Therapy  + heat therapy for muscle tension  Feel he would benefit greatly from PT, possibly TENS unit or massage? Seems to have underlying ROM issues that he states he saw spine surgeon for before, but this is not the same.  Discussed positioning of computer, screens and avoiding any heavy lifting or strenuous activity with upper body  for a few weeks.  F/up if not better in 1-2 months F/up sooner if worse     Ian Grana, PA-C 01/28/19 4:09 PM

## 2019-01-28 NOTE — Patient Instructions (Addendum)
Heat Therapy Heat therapy can help ease sore, stiff, injured, and tight muscles and joints. Heat relaxes your muscles, which may help ease your pain and muscle spasms. Do not use heat therapy unless your doctor tells you to use it. How to use heat therapy There are several different kinds of heat therapy, including:  Moist heat pack.  Hot water bottle.  Electric heating pad.  Heated gel pack.  Heated wrap.  Warm water bath. Your doctor will tell you how to use heat therapy. In general, you should: 1. Place a towel between your skin and the heat source. 2. Leave the heat on for 20-30 minutes. Your skin may turn pink. 3. Remove the heat if your skin turns bright red. You should remove the heat source if you are unable to feel pain, heat, or cold. You are more likely to get burned if you leave it on the skin for too long. Your doctor may also tell you to take a warm water bath. To do this: 1. Put a non-slip pad in the bathtub to prevent a fall. 2. Fill the bathtub with warm water. 3. Check the water temperature. 4. Soak in the water for 15-20 minutes, or as told by your doctor. 5. Be careful when you stand up after the bath. You may feel dizzy. 6. Pat yourself dry after the bath. Do not rub your skin to dry it. General recommendations for heat therapy  Do not sleep while using heat therapy. Only use heat therapy while you are awake.  Your skin may turn pink while using heat therapy. Do not use heat therapy if your skin turns red.  Do not use heat therapy if you have a new injury.  High heat or using heat for a long time can cause burns. Be careful not to burn your skin when using heat therapy.  Do not use heat therapy on areas of your skin that are already irritated, such as with a rash or sunburn. Get help if you have:  Blisters, redness, swelling (puffiness), or numbness.  New pain.  Pain that is getting worse. Summary  Heat therapy is the use of heat to help ease sore,  stiff, injured, and tight muscles and joints.  There are different types of heat therapy. Your doctor will tell you which one to use.  Only use heat therapy while you are awake.  Watch your skin to make sure you do not get burned while using heat therapy. This information is not intended to replace advice given to you by your health care provider. Make sure you discuss any questions you have with your health care provider. Document Released: 10/30/2011 Document Revised: 08/18/2017 Document Reviewed: 08/18/2017 Elsevier Interactive Patient Education  2019 [employer]   Neck Exercises Neck exercises can be important for many reasons:  They can help you to improve and maintain flexibility in your neck. This can be especially important as you age.  They can help to make your neck stronger. This can make movement easier.  They can reduce or prevent neck pain.  They may help your upper back. Ask your health care provider which neck exercises would be best for you. Exercises to improve neck flexibility Neck stretch Repeat this exercise 3-5 times. 1. Do this exercise while standing or while sitting in a chair. 2. Place your feet flat on the floor, shoulder-width apart. 3. Slowly turn your head to the right. Turn it all the way to the right so you can look over  your right shoulder. Do not tilt or tip your head. 4. Hold this position for 10-30 seconds. 5. Slowly turn your head to the left, to look over your left shoulder. 6. Hold this position for 10-30 seconds.  Neck retraction Repeat this exercise 8-10 times. Do this 3-4 times a day or as told by your health care provider. 1. Do this exercise while standing or while sitting in a sturdy chair. 2. Look straight ahead. Do not bend your neck. 3. Use your fingers to push your chin backward. Do not bend your neck for this movement. Continue to face straight ahead. If you are doing the exercise properly, you will feel a slight sensation in  your throat and a stretch at the back of your neck. 4. Hold the stretch for 1-2 seconds. Relax and repeat. Exercises to improve neck strength Neck press Repeat this exercise 10 times. Do it first thing in the morning and right before bed or as told by your health care provider. 1. Lie on your back on a firm bed or on the floor with a pillow under your head. 2. Use your neck muscles to push your head down on the pillow and straighten your spine. 3. Hold the position as well as you can. Keep your head facing up and your chin tucked. 4. Slowly count to 5 while holding this position. 5. Relax for a few seconds. Then repeat. Isometric strengthening Do a full set of these exercises 2 times a day or as told by your health care provider. 1. Sit in a supportive chair and place your hand on your forehead. 2. Push forward with your head and neck while pushing back with your hand. Hold for 10 seconds. 3. Relax. Then repeat the exercise 3 times. 4. Next, do thesequence again, this time putting your hand against the back of your head. Use your head and neck to push backward against the hand pressure. 5. Finally, do the same exercise on either side of your head, pushing sideways against the pressure of your hand. Prone head lifts Repeat this exercise 5 times. Do this 2 times a day or as told by your health care provider. 1. Lie face-down, resting on your elbows so that your chest and upper back are raised. 2. Start with your head facing downward, near your chest. Position your chin either on or near your chest. 3. Slowly lift your head upward. Lift until you are looking straight ahead. Then continue lifting your head as far back as you can stretch. 4. Hold your head up for 5 seconds. Then slowly lower it to your starting position. Supine head lifts Repeat this exercise 8-10 times. Do this 2 times a day or as told by your health care provider. 1. Lie on your back, bending your knees to point to the ceiling  and keeping your feet flat on the floor. 2. Lift your head slowly off the floor, raising your chin toward your chest. 3. Hold for 5 seconds. 4. Relax and repeat. Scapular retraction Repeat this exercise 5 times. Do this 2 times a day or as told by your health care provider. 1. Stand with your arms at your sides. Look straight ahead. 2. Slowly pull both shoulders backward and downward until you feel a stretch between your shoulder blades in your upper back. 3. Hold for 10-30 seconds. 4. Relax and repeat. Contact a health care provider if:  Your neck pain or discomfort gets much worse when you do an exercise.  Your neck  pain or discomfort does not improve within 2 hours after you exercise. If you have any of these problems, stop exercising right away. Do not do the exercises again unless your health care provider says that you can. Get help right away if:  You develop sudden, severe neck pain. If this happens, stop exercising right away. Do not do the exercises again unless your health care provider says that you can. This information is not intended to replace advice given to you by your health care provider. Make sure you discuss any questions you have with your health care provider. Document Released: 07/19/2015 Document Revised: 12/11/2017 Document Reviewed: 02/15/2015 Elsevier Interactive Patient Education  2019 [employer].

## 2019-01-31 ENCOUNTER — Encounter: Payer: Self-pay | Admitting: Family Medicine

## 2019-01-31 ENCOUNTER — Ambulatory Visit: Payer: 59 | Admitting: Family Medicine

## 2019-01-31 ENCOUNTER — Telehealth: Payer: Self-pay

## 2019-01-31 ENCOUNTER — Other Ambulatory Visit: Payer: Self-pay

## 2019-01-31 VITALS — BP 120/80 | HR 68 | Temp 97.6°F | Resp 17 | Ht 70.0 in | Wt 203.0 lb

## 2019-01-31 DIAGNOSIS — R319 Hematuria, unspecified: Secondary | ICD-10-CM

## 2019-01-31 LAB — URINALYSIS, ROUTINE W REFLEX MICROSCOPIC
Bacteria, UA: NONE SEEN /HPF
Bilirubin Urine: NEGATIVE
Glucose, UA: NEGATIVE
Hyaline Cast: NONE SEEN /LPF
Ketones, ur: NEGATIVE
Leukocytes,Ua: NEGATIVE
Nitrite: NEGATIVE
Protein, ur: NEGATIVE
Specific Gravity, Urine: 1.01 (ref 1.001–1.03)
Squamous Epithelial / HPF: NONE SEEN /HPF (ref <=5)
WBC, UA: NONE SEEN /HPF (ref 0–5)
pH: 6 (ref 5.0–8.0)

## 2019-01-31 LAB — MICROSCOPIC MESSAGE

## 2019-01-31 NOTE — Telephone Encounter (Signed)
He should be taking the naproxen and stop the ibuprofen.  He is on baby aspirin and NSAIDs so he may bleed easier, but usually a lot of NSAIDs causes stomach upset and bleeding/ulcers.  I have not seen urinary bleeding with NSAIDs, that's not to say it can't happen.  Usually its other anticoagulants or ASA that causes blood in urine and it usually needs to be rechecked regardless of if a med is causing it.  Have him make an appt to check urine or if he sees blood again.  thanks

## 2019-01-31 NOTE — Progress Notes (Signed)
Patient ID: Ian Leblanc, male    DOB: June 06, 1961, 59 y.o.   MRN: [id number]  PCP: Delsa Grana, PA-C  Chief Complaint  Patient presents with  . Hematuria    Had blood in urine x 1 this morning. Had hx of same symptom a few years ago    Subjective:   Ian Leblanc is a 58 y.o. male, presents to clinic with CC of hematuria. Ian Leblanc was recently seen here for neck strain and stiffness that radiated into his back.  Last week we talked about starting physical therapy, using muscle relaxers and changing from ibuprofen to naproxen twice daily.  He notes now in the exam room that he forgot to stop taking the ibuprofen like he was previously instructed, and he has started taking naproxen so he is taking 2 NSAIDs and aspirin.  He states that he did not drink very much water this morning, went for a run and when he got home he had some blood in his urine.  A similar incident had happened several years ago.  At that time he was referred to urology and worked up and he says they could not find any reason for it.  Pt denies any associated urinary frequency, urgency, dysuria, abdominal pain, flank pain. HPI    Patient Active Problem List   Diagnosis Date Noted  . Cervical spine disease 02/04/2015  . Neck pain on left side 02/04/2015  . Paresthesia of left arm 02/04/2015  . Numbness of fingers 02/04/2015  . Screening for prostate cancer 12/09/2014  . Diabetes mellitus type 2, uncomplicated ([place]) 70/26/3785  . Chest pain 07/16/2013  . Abnormal EKG 07/16/2013  . Hyperglycemia   . HTN (hypertension) 10/10/2012  . HLD (hyperlipidemia) 10/10/2012  . History of colonic polyps 10/10/2012     Prior to Admission medications   Medication Sig Start Date End Date Taking? Authorizing Provider  aspirin 81 MG tablet Take 81 mg by mouth daily.   Yes [provider]  atorvastatin (LIPITOR) 80 MG tablet TAKE 1 TABLET BY MOUTH ONCE DAILY AT  6  PM 10/21/18  Yes Delsa Grana, PA-C  benazepril  (LOTENSIN) 20 MG tablet Take 1 tablet (20 mg total) by mouth daily. 08/05/18  Yes Delsa Grana, PA-C  cetirizine (ZYRTEC) 10 MG tablet Take 1 tablet (10 mg total) by mouth daily. 05/07/18  Yes Delsa Grana, PA-C  cyclobenzaprine (FLEXERIL) 5 MG tablet Take 1-2 tablets (5-10 mg total) by mouth 3 (three) times daily as needed for muscle spasms. 01/28/19  Yes Delsa Grana, PA-C  fluticasone (FLONASE) 50 MCG/ACT nasal spray Place 2 sprays into both nostrils daily. 05/23/17  Yes Dena Billet B, PA-C  gabapentin (NEURONTIN) 300 MG capsule Day 1: Take one daily. Day 2: Take one twice a day. Day 3: Take one 3 times a day.Cont. this dose. 03/28/18  Yes Orlena Sheldon, PA-C  metFORMIN (GLUCOPHAGE) 1000 MG tablet Take 1 tablet (1,000 mg total) by mouth daily with breakfast. 06/28/18  Yes Delsa Grana, PA-C  Multiple Vitamins-Minerals (MULTIVITAMIN PO) Take 1 tablet by mouth daily. Over 50 mvi qd   Yes [provider]  naproxen (NAPROSYN) 500 MG tablet Take 1 tablet (500 mg total) by mouth 2 (two) times daily with a meal. 01/28/19  Yes Delsa Grana, PA-C  Omega-3 Fatty Acids (OMEGA-3 FISH OIL PO) Take 600 mg by mouth daily.    Yes [provider]     Allergies  Allergen Reactions  . Crestor [Rosuvastatin] Other (See Comments)  Severe myalgias     No family history on file.   Social History   Socioeconomic History  . Marital status: Married    Spouse name: Not on file  . Number of children: Not on file  . Years of education: Not on file  . Highest education level: Not on file  Occupational History  . Not on file  Social Needs  . Financial resource strain: Not on file  . Food insecurity    Worry: Not on file    Inability: Not on file  . Transportation needs    Medical: Not on file    Non-medical: Not on file  Tobacco Use  . Smoking status: Former Smoker    Quit date: 07/17/2003    Years since quitting: 15.5  . Smokeless tobacco: Never Used  Substance and Sexual Activity  .  Alcohol use: No  . Drug use: No  . Sexual activity: Not on file  Lifestyle  . Physical activity    Days per week: Not on file    Minutes per session: Not on file  . Stress: Not on file  Relationships  . Social Herbalist on phone: Not on file    Gets together: Not on file    Attends religious service: Not on file    Active member of club or organization: Not on file    Attends meetings of clubs or organizations: Not on file    Relationship status: Not on file  . Intimate partner violence    Fear of current or ex partner: Not on file    Emotionally abused: Not on file    Physically abused: Not on file    Forced sexual activity: Not on file  Other Topics Concern  . Not on file  Social History Narrative  . Not on file     Review of Systems  Constitutional: Negative.   HENT: Negative.   Eyes: Negative.   Respiratory: Negative.   Cardiovascular: Negative.   Gastrointestinal: Negative.   Endocrine: Negative.   Genitourinary: Negative.   Musculoskeletal: Negative.   Skin: Negative.   Allergic/Immunologic: Negative.   Neurological: Negative.   Hematological: Negative.   Psychiatric/Behavioral: Negative.   All other systems reviewed and are negative.      Objective:    Vitals:   01/31/19 1435  BP: 120/80  Pulse: 68  Resp: 17  Temp: 97.6 F (36.4 C)  TempSrc: Oral  SpO2: 99%  Weight: 203 lb (92.1 kg)  Height: 5\' 10"  (1.778 m)      Physical Exam Vitals signs and nursing note reviewed.  Constitutional:      Appearance: He is well-developed.  HENT:     Head: Normocephalic and atraumatic.     Nose: Nose normal.  Eyes:     General:        Right eye: No discharge.        Left eye: No discharge.     Conjunctiva/sclera: Conjunctivae normal.  Neck:     Trachea: No tracheal deviation.  Cardiovascular:     Rate and Rhythm: Normal rate and regular rhythm.  Pulmonary:     Effort: Pulmonary effort is normal. No respiratory distress.     Breath  sounds: No stridor.  Musculoskeletal: Normal range of motion.  Skin:    General: Skin is warm and dry.     Findings: No rash.  Neurological:     Mental Status: He is alert.     Motor: No abnormal  muscle tone.     Coordination: Coordination normal.  Psychiatric:        Behavior: Behavior normal.      Results for orders placed or performed in visit on 01/31/19  Urinalysis, Routine w reflex microscopic  Result Value Ref Range   Color, Urine YELLOW YELLOW   APPearance CLEAR CLEAR   Specific Gravity, Urine 1.010 1.001 - 1.03   pH 6.0 5.0 - 8.0   Glucose, UA NEGATIVE NEGATIVE   Bilirubin Urine NEGATIVE NEGATIVE   Ketones, ur NEGATIVE NEGATIVE   Hgb urine dipstick TRACE (A) NEGATIVE   Protein, ur NEGATIVE NEGATIVE   Nitrite NEGATIVE NEGATIVE   Leukocytes,Ua NEGATIVE NEGATIVE   WBC, UA NONE SEEN 0 - 5 /HPF   RBC / HPF 0-2 0 - 2 /HPF   Squamous Epithelial / LPF NONE SEEN < OR = 5 /HPF   Bacteria, UA NONE SEEN NONE SEEN /HPF   Hyaline Cast NONE SEEN NONE SEEN /LPF  Microscopic Message  Result Value Ref Range   Note          Assessment & Plan:      ICD-10-CM   1. Hematuria, unspecified type  R31.9 Urinalysis, Routine w reflex microscopic    Accidentally taking double NSAIDs, did not drink this am, went for a run, has some trace blood in urine, micro normal today.  Had similar occurrence several years ago worked up by urology w/o any cause found.    He will d/c ibuprofen (as previously instructed), pay careful attention to hydration, come back in 1-2 weeks for repeat UA to test for clearance of blood.  If it persists will likely obtain labs and add culture and refer back to urology only if necessary.     Delsa Grana, PA-C 01/31/19 2:40 PM

## 2019-01-31 NOTE — Telephone Encounter (Signed)
Pt called stating that he was taking a lot of ibuprofen for his neck pain and this morning, he had a lot of blood in his urine. Pt states he has urinated twice after that and no blood. Pt would like to know if the ibuprofen can cause that? Please advise.

## 2019-01-31 NOTE — Progress Notes (Signed)
Will you please call Ian Leblanc and let him know that under the microscope there was very little blood found (not out of normal range).  He does not HAVE to come back to repeat if he doesn't want to.   thanks

## 2019-01-31 NOTE — Telephone Encounter (Signed)
Pt notified. Pt states he would like to come in today.

## 2019-02-06 ENCOUNTER — Encounter: Payer: Self-pay | Admitting: Physical Therapy

## 2019-02-06 ENCOUNTER — Ambulatory Visit: Payer: 59 | Attending: Family Medicine | Admitting: Physical Therapy

## 2019-02-06 ENCOUNTER — Other Ambulatory Visit: Payer: Self-pay

## 2019-02-06 DIAGNOSIS — M25512 Pain in left shoulder: Secondary | ICD-10-CM | POA: Diagnosis present

## 2019-02-06 DIAGNOSIS — M542 Cervicalgia: Secondary | ICD-10-CM

## 2019-02-06 DIAGNOSIS — R293 Abnormal posture: Secondary | ICD-10-CM | POA: Insufficient documentation

## 2019-02-07 NOTE — Therapy (Signed)
[address] [place], [place], [address] Phone: [phone]   Fax:  [phone]  Physical Therapy Evaluation  Patient Details  Name: Ian Leblanc MRN: [id number] Date of Birth: [birth year] Referring Provider (PT): Delsa Grana , [place]   Encounter Date: 02/06/2019  PT End of Session - 02/06/19 1548    Visit Number  1    Number of Visits  12    Date for PT Re-Evaluation  03/20/19    PT Start Time  1500    PT Stop Time  1550    PT Time Calculation (min)  50 min    Activity Tolerance  Patient tolerated treatment well    Behavior During Therapy  [facility] for tasks assessed/performed       Past Medical History:  Diagnosis Date  . Hyperglycemia   . Hyperlipidemia   . Hypertension   . Sessile colonic polyp     Past Surgical History:  Procedure Laterality Date  . BACK SURGERY     cyst removal    There were no vitals filed for this visit.   Subjective Assessment - 02/06/19 1507    Subjective  Pt presents with pain in L side of his neck, shoulder. His pain began about 2 weeks ago.  He feels pain mostly at night when he lays down.  Is working from home now and he is sitting at the kitchen table to work.  He adjusted his monitor and that has helped. Pain radiates to posterior shoulder, does not go into LL UE.  Denies weakness.  He runs 2 miles most days.  He also lifts some weights has since stopped.    Pertinent History  has lost 50 lbs in the past yr, HTN, DM    Limitations  Sitting;Reading;Lifting;Other (comment)   sleep   How long can you sit comfortably?  takes breaks frequently, less than an hour    Diagnostic tests  none    Patient Stated Goals  Patient would like to have less pain    Currently in Pain?  Yes    Pain Score  8     Pain Location  Shoulder    Pain Orientation  Left    Pain Descriptors / Indicators  Aching;Tightness;Throbbing    Pain Type  Chronic pain;Acute pain    Pain Radiating Towards  neck to  superior aspect of shoulder    Pain Onset  1 to 4 weeks ago    Pain Frequency  Intermittent    Aggravating Factors   sitting, PM , the way I move it    Pain Relieving Factors  heating pad, icy hot, pillows    Effect of Pain on Daily Activities  limits productivity at work, can't golf as he wants to (L handed golfer)    Multiple Pain Sites  No         [facility] PT Assessment - 02/07/19 0001      Assessment   Medical Diagnosis  L trapezius strain     Referring Provider (PT)  Delsa Grana , PA-C    Onset Date/Surgical Date  --   2 weeks    Hand Dominance  Right    Prior Therapy  None recent       Precautions   Precautions  None      Restrictions   Weight Bearing Restrictions  No      Balance Screen   Has the patient fallen in the past 6 months  No  Home Environment   Living Environment  Private residence    Living Arrangements  Spouse/significant other    Type of [place]      Prior Function   Level of [place]  Full time employment    Materials engineer.  parts     Leisure  golf, kids       Cognition   Overall Cognitive Status  Within Functional Limits for tasks assessed      Observation/Other Assessments   Focus on Therapeutic Outcomes (FOTO)   55%      Sensation   Light Touch  Appears Intact    Additional Comments  has had numbness in L hand with previous problem       Posture/Postural Control   Posture/Postural Control  Postural limitations    Postural Limitations  Rounded Shoulders;Forward head    Posture Comments  L scapula protracted, upwardly rotated       AROM   Cervical Flexion  60   pain end range   Cervical Extension  30     Cervical - Right Side Bend  30    Cervical - Left Side Bend  25    Cervical - Right Rotation  65    Cervical - Left Rotation  60       Strength   Overall Strength Comments  WFL except L triceps 4/5       Palpation   Spinal mobility  hypomobile throughout cervical and  thoracic spine     Palpation comment  tension, tightness noted along superior aspect of L shoulder (upper trap, supraspinatus and teres minor), tender throughout.  Pt with divet in upper thoracic spine due to removal of tissue many yrs ago                Objective measurements completed on examination: See above findings.      [facility] Adult PT Treatment/Exercise - 02/07/19 0001      Self-Care   Posture  throughout session, seated and standing, implications for neck pain     Heat/Ice Application  MHP     Other Self-Care Comments   HEP , POC       Shoulder Exercises: Seated   Retraction  Strengthening;Both;10 reps      Neck Exercises: Stretches   Upper Trapezius Stretch  2 reps;30 seconds    Levator Stretch  2 reps;30 seconds    Corner Stretch  3 reps;30 seconds    Corner Stretch Limitations  arms at varying heights              PT Education - 02/06/19 1545    Education Details  PT.POC, cervical radiculopathy, posture    Person(s) Educated  Patient    Methods  Explanation    Comprehension  Verbalized understanding;Returned demonstration;Need further instruction          PT Long Term Goals - 02/07/19 0824      PT LONG TERM GOAL #1   Title  Pt will be I with HEP for posture, flexibility and strength    Time  6    Period  Weeks    Status  New    Target Date  03/20/19      PT LONG TERM GOAL #2   Title  Pt will be able to sit with corrected posture without cues for optimal alignment    Time  6    Period  Weeks    Status  New    Target Date  03/20/19      PT LONG TERM GOAL #3   Title  Pt will be able to improve cervical AROM to be painfree and WFL in all planes.    Time  6    Period  Weeks    Status  New    Target Date  03/20/19      PT LONG TERM GOAL #4   Title  Pt will be able to continue with upper body resistance training with good form and knowledge about stability.    Time  6    Period  Weeks    Status  New    Target Date  03/20/19       PT LONG TERM GOAL #5   Title  Pt will have no more than minimal pain at the end of the day (3/10, relieved with rest and stretching)    Time  6    Period  Weeks    Status  New    Target Date  03/20/19             Plan - 02/07/19 0827    Clinical Impression Statement  Pt presetns with low complexity eval of pain in L neck/shoulder.  His symptoms are consistent with cervical strain due to abnormal posture, poor ergonomics due to new home office setup.  He has a history of cervical stenosis and this may be an aggravation of that condition.  His pain was 8/10 today, did not test for radiculopathy.  LUE is weak in C6/C7.  Needed cues for posture throughout ROM testing.  He is limited in all planes of cervical AROM. He should do well with PT to educate on posture, ensure safety with exercise and modalities for pain, cervical tension.    Personal Factors and Comorbidities  Comorbidity 1;Past/Current Experience;Profession    Comorbidities  HTN, DM (well controlled)    Examination-Activity Limitations  Sleep;Lift;Reach Overhead;Bed Mobility    Examination-Participation Restrictions  Yard Work;Community Activity    Stability/Clinical Decision Making  Stable/Uncomplicated    Clinical Decision Making  Low    Rehab Potential  Excellent    PT Frequency  2x / week    PT Duration  6 weeks    PT Treatment/Interventions  ADLs/Self Care Home Management;Cryotherapy;Ultrasound;Dry needling;Taping;Therapeutic activities;Electrical Stimulation;Moist Heat;Traction;Therapeutic exercise;Patient/family education;Manual techniques;Passive range of motion;Neuromuscular re-education;Functional mobility training    PT Next Visit Plan  check HEP, DN, UBE, manual    PT Home Exercise Plan  chin tuck, scap squeeze, corner stretch, upper trap and levator scap    Consulted and Agree with Plan of Care  Patient       Patient will benefit from skilled therapeutic intervention in order to improve the following deficits and  impairments:  Decreased mobility, Hypomobility, Increased muscle spasms, Postural dysfunction, Pain, Impaired UE functional use, Impaired flexibility, Increased fascial restricitons, Decreased strength, Decreased range of motion  Visit Diagnosis: 1. Cervicalgia   2. Acute pain of left shoulder   3. Abnormal posture        Problem List Patient Active Problem List   Diagnosis Date Noted  . Cervical spine disease 02/04/2015  . Neck pain on left side 02/04/2015  . Paresthesia of left arm 02/04/2015  . Numbness of fingers 02/04/2015  . Screening for prostate cancer 12/09/2014  . Diabetes mellitus type 2, uncomplicated ([place]) 81/82/9937  . Chest pain 07/16/2013  . Abnormal EKG 07/16/2013  . Hyperglycemia   . HTN (hypertension) 10/10/2012  . HLD (  hyperlipidemia) 10/10/2012  . History of colonic polyps 10/10/2012    Ian Leblanc 02/07/2019, 8:38 AM  [facility] [address] [place], [place], [address] Phone: [phone]   Fax:  [phone]  Name: Ian Leblanc MRN: [id number] Date of Birth: April 11, 1961  Raeford Razor, PT 02/07/19 8:38 AM Phone: [phone] Fax: [phone]

## 2019-02-14 ENCOUNTER — Other Ambulatory Visit: Payer: Self-pay

## 2019-02-14 ENCOUNTER — Ambulatory Visit: Payer: 59 | Admitting: Physical Therapy

## 2019-02-14 DIAGNOSIS — M542 Cervicalgia: Secondary | ICD-10-CM | POA: Diagnosis not present

## 2019-02-14 DIAGNOSIS — M25512 Pain in left shoulder: Secondary | ICD-10-CM

## 2019-02-14 DIAGNOSIS — R293 Abnormal posture: Secondary | ICD-10-CM

## 2019-02-14 MED ORDER — BENAZEPRIL HCL 20 MG PO TABS: 20.0000 mg | ORAL_TABLET | Freq: Every day | ORAL | 1 refills | Status: DC

## 2019-02-14 NOTE — Therapy (Signed)
[place], [place], [address] Phone: [phone]   Fax:  [phone]  Physical Therapy Treatment  Patient Details  Name: Monnie Anspach MRN: [id number] Date of Birth: Apr 21, 1961 Referring Provider (PT): Delsa Grana , [place]   Encounter Date: 02/14/2019  PT End of Session - 02/14/19 1025    Visit Number  2    Number of Visits  12    Date for PT Re-Evaluation  03/20/19    PT Start Time  0811    PT Stop Time  0859    PT Time Calculation (min)  48 min    Activity Tolerance  Patient tolerated treatment well    Behavior During Therapy  [facility] for tasks assessed/performed       Past Medical History:  Diagnosis Date  . Hyperglycemia   . Hyperlipidemia   . Hypertension   . Sessile colonic polyp     Past Surgical History:  Procedure Laterality Date  . BACK SURGERY     cyst removal    There were no vitals filed for this visit.  Subjective Assessment - 02/14/19 0811    Subjective  "I am still feeling some stiffness in the neck, I ran 2 miles this morning. Monday I was having issues and I had to go home early"    Currently in Pain?  Yes         [facility] PT Assessment - 02/14/19 0001      Assessment   Medical Diagnosis  L trapezius strain     Referring Provider (PT)  Delsa Grana , PA-C                   [facility] Adult PT Treatment/Exercise - 02/14/19 0001      Exercises   Exercises  Shoulder      Neck Exercises: Supine   Capital Flexion  10 reps;10 secs   chin tuck head lift      Shoulder Exercises: Supine   Other Supine Exercises  scapular retraction       Shoulder Exercises: Standing   Other Standing Exercises  lower trap wall y lift 2 x10      Manual Therapy   Manual Therapy  Joint mobilization;Soft tissue mobilization;Other (comment)    Manual therapy comments  skilled palpation and monitoring of pt throughout TPDN    Joint Mobilization  C3-C7 PA, and T1-T6 PA, bil first rib  grade III mobs    Soft tissue mobilization  IASTM along the the L upper trap/ sub-occipital    Other Manual Therapy  upper trap inhibition taping      Neck Exercises: Stretches   Upper Trapezius Stretch  2 reps;30 seconds    Corner Stretch  2 reps;30 seconds       Trigger Point Dry Needling - 02/14/19 0001    Consent Given?  Yes    Education Handout Provided  Yes    Muscles Treated Head and Neck  Suboccipitals;Upper trapezius    Other Dry Needling  L sub-occipitals    Upper Trapezius Response  Twitch reponse elicited;Palpable increased muscle length           PT Education - 02/14/19 8527    Education Details  muscle anatomy and referral patterns. What TPDN is/ what to expect, and benefits    Person(s) Educated  Patient    Methods  Explanation;Verbal cues    Comprehension  Verbalized understanding;Verbal cues required  PT Long Term Goals - 02/07/19 0824      PT LONG TERM GOAL #1   Title  Pt will be I with HEP for posture, flexibility and strength    Time  6    Period  Weeks    Status  New    Target Date  03/20/19      PT LONG TERM GOAL #2   Title  Pt will be able to sit with corrected posture without cues for optimal alignment    Time  6    Period  Weeks    Status  New    Target Date  03/20/19      PT LONG TERM GOAL #3   Title  Pt will be able to improve cervical AROM to be painfree and WFL in all planes.    Time  6    Period  Weeks    Status  New    Target Date  03/20/19      PT LONG TERM GOAL #4   Title  Pt will be able to continue with upper body resistance training with good form and knowledge about stability.    Time  6    Period  Weeks    Status  New    Target Date  03/20/19      PT LONG TERM GOAL #5   Title  Pt will have no more than minimal pain at the end of the day (3/10, relieved with rest and stretching)    Time  6    Period  Weeks    Status  New    Target Date  03/20/19            Plan - 02/14/19 0901    Clinical  Impression Statement  pt reports continued soreness inthe L upper trap. educated and consent was provided for TPDN focusing on L sub-occipitals and upper trap. STW and cervicothoracic mobs to maximize mobility. He did well with stretching and exercise reporting no significant soreness. trialed inhibition taping to calm down upper trap activation which he reported improvement.    PT Next Visit Plan  update HEP for shoulder stabilizers, response to DN, UBE, manual, how was inhibition taping    PT Home Exercise Plan  chin tuck, scap squeeze, corner stretch, upper trap and levator scap, lower trap wall y's    Consulted and Agree with Plan of Care  Patient       Patient will benefit from skilled therapeutic intervention in order to improve the following deficits and impairments:     Visit Diagnosis: 1. Cervicalgia   2. Acute pain of left shoulder   3. Abnormal posture        Problem List Patient Active Problem List   Diagnosis Date Noted  . Cervical spine disease 02/04/2015  . Neck pain on left side 02/04/2015  . Paresthesia of left arm 02/04/2015  . Numbness of fingers 02/04/2015  . Screening for prostate cancer 12/09/2014  . Diabetes mellitus type 2, uncomplicated ([place]) 90/24/0973  . Chest pain 07/16/2013  . Abnormal EKG 07/16/2013  . Hyperglycemia   . HTN (hypertension) 10/10/2012  . HLD (hyperlipidemia) 10/10/2012  . History of colonic polyps 10/10/2012   Starr [place] PT, DPT, LAT, ATC  02/14/19  9:05 AM      [facility] [address] [place], [place], [address] Phone: [phone]   Fax:  [phone]  Name: Keithen Capo MRN: [id number] Date of Birth: May 19, 1961

## 2019-02-17 ENCOUNTER — Encounter: Payer: Self-pay | Admitting: Physical Therapy

## 2019-02-17 ENCOUNTER — Ambulatory Visit: Payer: 59 | Admitting: Physical Therapy

## 2019-02-17 ENCOUNTER — Other Ambulatory Visit: Payer: Self-pay

## 2019-02-17 DIAGNOSIS — M542 Cervicalgia: Secondary | ICD-10-CM | POA: Diagnosis not present

## 2019-02-17 DIAGNOSIS — R293 Abnormal posture: Secondary | ICD-10-CM

## 2019-02-17 DIAGNOSIS — M25512 Pain in left shoulder: Secondary | ICD-10-CM

## 2019-02-17 NOTE — Therapy (Signed)
[place], [place], [address] Phone: [phone]   Fax:  [phone]  Physical Therapy Treatment  Patient Details  Name: Ian Leblanc MRN: [id number] Date of Birth: [birth year] Referring Provider (PT): Delsa Grana , [place]   Encounter Date: 02/17/2019  PT End of Session - 02/17/19 1432    Visit Number  3    Number of Visits  12    Date for PT Re-Evaluation  03/20/19    PT Start Time  1430    PT Stop Time  1513    PT Time Calculation (min)  43 min    Activity Tolerance  Patient tolerated treatment well    Behavior During Therapy  [facility] for tasks assessed/performed       Past Medical History:  Diagnosis Date  . Hyperglycemia   . Hyperlipidemia   . Hypertension   . Sessile colonic polyp     Past Surgical History:  Procedure Laterality Date  . BACK SURGERY     cyst removal    There were no vitals filed for this visit.  Subjective Assessment - 02/17/19 1430    Subjective  That really helped last time.  And I could tell a difference when i took that tape off.  I always hurt.    Currently in Pain?  Yes    Pain Score  4     Pain Location  Shoulder    Pain Orientation  Left    Pain Descriptors / Indicators  Aching;Tightness    Pain Type  Acute pain    Pain Onset  More than a month ago    Pain Frequency  Intermittent    Aggravating Factors   sitting back against the chair    Pain Relieving Factors  heating pad, pillows    Multiple Pain Sites  No        [facility] Adult PT Treatment/Exercise - 02/17/19 0001      Shoulder Exercises: Supine   Other Supine Exercises  supine scapular stabilization x 10 green band: overhead lift, horizontal pull , external rotation x 10       Shoulder Exercises: Standing   Other Standing Exercises  lower trap wall set x 10       Shoulder Exercises: ROM/Strengthening   UBE (Upper Arm Bike)  6 min, 3 min each direction, L1       Shoulder Exercises: Stretch   Corner  Stretch  3 reps;30 seconds      Manual Therapy   Joint Mobilization  scapular mobs    Soft tissue mobilization  bilateral upper traps, levator scap, cervicals    Other Manual Therapy  upper trap inhibition taping   1 strip crossing superior aspect L shoulder ([place])     Neck Exercises: Stretches   Upper Trapezius Stretch  2 reps;30 seconds    Levator Stretch  3 reps;30 seconds         PT Education - 02/17/19 1432    Education Details  scapular stabilization, HEP    Person(s) Educated  Patient    Methods  Explanation;Demonstration    Comprehension  Verbalized understanding;Returned demonstration          PT Long Term Goals - 02/07/19 0824      PT LONG TERM GOAL #1   Title  Pt will be I with HEP for posture, flexibility and strength    Time  6    Period  Weeks    Status  New  Target Date  03/20/19      PT LONG TERM GOAL #2   Title  Pt will be able to sit with corrected posture without cues for optimal alignment    Time  6    Period  Weeks    Status  New    Target Date  03/20/19      PT LONG TERM GOAL #3   Title  Pt will be able to improve cervical AROM to be painfree and WFL in all planes.    Time  6    Period  Weeks    Status  New    Target Date  03/20/19      PT LONG TERM GOAL #4   Title  Pt will be able to continue with upper body resistance training with good form and knowledge about stability.    Time  6    Period  Weeks    Status  New    Target Date  03/20/19      PT LONG TERM GOAL #5   Title  Pt will have no more than minimal pain at the end of the day (3/10, relieved with rest and stretching)    Time  6    Period  Weeks    Status  New    Target Date  03/20/19            Plan - 02/17/19 1427    Clinical Impression Statement  Pt with less pain today and report of decreased neck pain, tension in L scapula.  Pain post session about the same.  Utilized tape to recreate inhibition of L upper trap.    PT Treatment/Interventions  ADLs/Self  Care Home Management;Cryotherapy;Ultrasound;Dry needling;Taping;Therapeutic activities;Electrical Stimulation;Moist Heat;Traction;Therapeutic exercise;Patient/family education;Manual techniques;Passive range of motion;Neuromuscular re-education;Functional mobility training    PT Next Visit Plan  update HEP for shoulder stabilizers, response to DN, UBE, manual, how was inhibition taping    PT Home Exercise Plan  chin tuck, scap squeeze, corner stretch, upper trap and levator scap, lower trap wall y's, supine scapular stabilization    Consulted and Agree with Plan of Care  Patient       Patient will benefit from skilled therapeutic intervention in order to improve the following deficits and impairments:  Decreased mobility, Hypomobility, Increased muscle spasms, Postural dysfunction, Pain, Impaired UE functional use, Impaired flexibility, Increased fascial restricitons, Decreased strength, Decreased range of motion  Visit Diagnosis: 1. Cervicalgia   2. Acute pain of left shoulder   3. Abnormal posture        Problem List Patient Active Problem List   Diagnosis Date Noted  . Cervical spine disease 02/04/2015  . Neck pain on left side 02/04/2015  . Paresthesia of left arm 02/04/2015  . Numbness of fingers 02/04/2015  . Screening for prostate cancer 12/09/2014  . Diabetes mellitus type 2, uncomplicated ([place]) 97/09/6376  . Chest pain 07/16/2013  . Abnormal EKG 07/16/2013  . Hyperglycemia   . HTN (hypertension) 10/10/2012  . HLD (hyperlipidemia) 10/10/2012  . History of colonic polyps 10/10/2012    Brisia Schuermann 02/17/2019, 3:27 PM  [facility] [address] [place], [place], [address] Phone: [phone]   Fax:  [phone]  Name: Ian Leblanc MRN: [id number] Date of Birth: 31-Jan-1961  Raeford Razor, PT 02/17/19 3:27 PM Phone: [phone] Fax: [phone]

## 2019-02-17 NOTE — Patient Instructions (Signed)
Over Head Pull: Narrow Grip       On back, knees bent, feet flat, band across thighs, elbows straight but relaxed. Pull hands apart (start). Keeping elbows straight, bring arms up and over head, hands toward floor. Keep pull steady on band. Hold momentarily. Return slowly, keeping pull steady, back to start. Repeat ___ times. Band color ______   Side Pull: Double Arm   On back, knees bent, feet flat. Arms perpendicular to body, shoulder level, elbows straight but relaxed. Pull arms out to sides, elbows straight. Resistance band comes across collarbones, hands toward floor. Hold momentarily. Slowly return to starting position. Repeat ___ times. Band color _____   Sash   On back, knees bent, feet flat, left hand on left hip, right hand above left. Pull right arm DIAGONALLY (hip to shoulder) across chest. Bring right arm along head toward floor. Hold momentarily. Slowly return to starting position. Repeat ___ times. Do with left arm. Band color ______   Shoulder Rotation: Double Arm   On back, knees bent, feet flat, elbows tucked at sides, bent 90, hands palms up. Pull hands apart and down toward floor, keeping elbows near sides. Hold momentarily. Slowly return to starting position. Repeat ___ times. Band color ______   

## 2019-02-19 ENCOUNTER — Other Ambulatory Visit: Payer: Self-pay

## 2019-02-19 ENCOUNTER — Encounter: Payer: Self-pay | Admitting: Physical Therapy

## 2019-02-19 ENCOUNTER — Ambulatory Visit: Payer: 59 | Attending: Family Medicine | Admitting: Physical Therapy

## 2019-02-19 DIAGNOSIS — R293 Abnormal posture: Secondary | ICD-10-CM | POA: Diagnosis present

## 2019-02-19 DIAGNOSIS — M25512 Pain in left shoulder: Secondary | ICD-10-CM | POA: Insufficient documentation

## 2019-02-19 DIAGNOSIS — M542 Cervicalgia: Secondary | ICD-10-CM | POA: Diagnosis not present

## 2019-02-19 NOTE — Therapy (Signed)
[place], [place], [address] Phone: [phone]   Fax:  [phone]  Physical Therapy Treatment  Patient Details  Name: Ian Leblanc MRN: [id number] Date of Birth: May 16, 1961 Referring Provider (PT): Delsa Grana , [place]   Encounter Date: 02/19/2019  PT End of Session - 02/19/19 1626    Visit Number  4    Number of Visits  12    Date for PT Re-Evaluation  03/20/19    PT Start Time  1626    PT Stop Time  1720    PT Time Calculation (min)  54 min    Activity Tolerance  Patient tolerated treatment well    Behavior During Therapy  [facility] for tasks assessed/performed       Past Medical History:  Diagnosis Date  . Hyperglycemia   . Hyperlipidemia   . Hypertension   . Sessile colonic polyp     Past Surgical History:  Procedure Laterality Date  . BACK SURGERY     cyst removal    There were no vitals filed for this visit.  Subjective Assessment - 02/19/19 1627    Subjective  " I am doing pretty good, but I am feeling some soreness in the L side of the"    Currently in Pain?  Yes    Pain Score  3     Pain Location  Neck    Pain Orientation  Left    Pain Descriptors / Indicators  Aching;Sore    Pain Type  Chronic pain    Pain Onset  More than a month ago    Pain Frequency  Intermittent                       [facility] Adult PT Treatment/Exercise - 02/19/19 0001      Neck Exercises: Standing   Neck Retraction  5 reps;10 secs   with pillow behind the head to promote proper form     Shoulder Exercises: Standing   Horizontal ABduction  Strengthening;15 reps;Theraband   x 2 sets   Theraband Level (Shoulder Horizontal ABduction)  Level 3 (Green)    Horizontal ABduction Limitations  tactile cues to promote scapular retraction     Other Standing Exercises  lower trap wall set x 10       Shoulder Exercises: ROM/Strengthening   UBE (Upper Arm Bike)  L4 x 4 min    changing direction at 2  min      Manual Therapy   Manual therapy comments  skilled palpation and monitoring of pt throughout TPDN    Joint Mobilization  L first rib mob grade III,     Soft tissue mobilization  IASTM along sub-occipitals, upper trap / levator scapulae.    Other Manual Therapy  upper trap inhibition taping   pulling from posterior to anterior 3 overlaping "I" strips     Neck Exercises: Stretches   Upper Trapezius Stretch  2 reps;Left;30 seconds    Levator Stretch  1 rep;Left;30 seconds    Corner Stretch  3 reps;30 seconds   using door frame upper, middle and low pec      Trigger Point Dry Needling - 02/19/19 0001    Consent Given?  Yes    Education Handout Provided  Previously provided    Muscles Treated Head and Neck  Cervical multifidi;Levator scapulae    Other Dry Needling  L sub-occipitals    Upper Trapezius Response  Twitch reponse elicited;Palpable increased  muscle length   L side only   Levator Scapulae Response  Twitch response elicited;Palpable increased muscle length   L only   Cervical multifidi Response  Twitch reponse elicited;Palpable increased muscle length   L C3-C6          PT Education - 02/19/19 1703    Education Details  updated HEP for shoulder strengthening and rib mobs.    Person(s) Educated  Patient    Methods  Explanation;Verbal cues    Comprehension  Verbalized understanding;Verbal cues required          PT Long Term Goals - 02/07/19 0824      PT LONG TERM GOAL #1   Title  Pt will be I with HEP for posture, flexibility and strength    Time  6    Period  Weeks    Status  New    Target Date  03/20/19      PT LONG TERM GOAL #2   Title  Pt will be able to sit with corrected posture without cues for optimal alignment    Time  6    Period  Weeks    Status  New    Target Date  03/20/19      PT LONG TERM GOAL #3   Title  Pt will be able to improve cervical AROM to be painfree and WFL in all planes.    Time  6    Period  Weeks    Status  New     Target Date  03/20/19      PT LONG TERM GOAL #4   Title  Pt will be able to continue with upper body resistance training with good form and knowledge about stability.    Time  6    Period  Weeks    Status  New    Target Date  03/20/19      PT LONG TERM GOAL #5   Title  Pt will have no more than minimal pain at the end of the day (3/10, relieved with rest and stretching)    Time  6    Period  Weeks    Status  New    Target Date  03/20/19            Plan - 02/19/19 1724    Clinical Impression Statement  pt reports improvement but does note conitnued intermittent soreness along the L upper trap / levator scapulae. Contiued TPDN today followed with IASTM and inhibition taping. focused on pec stretching and posterior shoulder strengthening to promote proper posture. reviewed chin tuck HEP and modified it to standing with pillow behind head.    PT Treatment/Interventions  ADLs/Self Care Home Management;Cryotherapy;Ultrasound;Dry needling;Taping;Therapeutic activities;Electrical Stimulation;Moist Heat;Traction;Therapeutic exercise;Patient/family education;Manual techniques;Passive range of motion;Neuromuscular re-education;Functional mobility training    PT Next Visit Plan  update HEP for shoulder stabilizers, DN PRN, UBE, manual, continued inhibition taping,    PT Home Exercise Plan  chin tuck, scap squeeze, corner stretch, upper trap and levator scap, lower trap wall y's, supine scapular stabilization    Consulted and Agree with Plan of Care  Patient       Patient will benefit from skilled therapeutic intervention in order to improve the following deficits and impairments:  Decreased mobility, Hypomobility, Increased muscle spasms, Postural dysfunction, Pain, Impaired UE functional use, Impaired flexibility, Increased fascial restricitons, Decreased strength, Decreased range of motion  Visit Diagnosis: 1. Cervicalgia   2. Acute pain of left shoulder   3. Abnormal posture  Problem List Patient Active Problem List   Diagnosis Date Noted  . Cervical spine disease 02/04/2015  . Neck pain on left side 02/04/2015  . Paresthesia of left arm 02/04/2015  . Numbness of fingers 02/04/2015  . Screening for prostate cancer 12/09/2014  . Diabetes mellitus type 2, uncomplicated ([place]) 39/35/9409  . Chest pain 07/16/2013  . Abnormal EKG 07/16/2013  . Hyperglycemia   . HTN (hypertension) 10/10/2012  . HLD (hyperlipidemia) 10/10/2012  . History of colonic polyps 10/10/2012   Starr Lake PT, DPT, LAT, ATC  02/19/19  5:27 PM      [facility] [address] [place], [place], [address] Phone: [phone]   Fax:  [phone]  Name: Ian Leblanc MRN: [id number] Date of Birth: August 27, 1960

## 2019-02-24 ENCOUNTER — Other Ambulatory Visit: Payer: Self-pay

## 2019-02-24 ENCOUNTER — Encounter: Payer: Self-pay | Admitting: Physical Therapy

## 2019-02-24 ENCOUNTER — Ambulatory Visit: Payer: 59 | Admitting: Physical Therapy

## 2019-02-24 DIAGNOSIS — M542 Cervicalgia: Secondary | ICD-10-CM | POA: Diagnosis not present

## 2019-02-24 DIAGNOSIS — R293 Abnormal posture: Secondary | ICD-10-CM

## 2019-02-24 DIAGNOSIS — M25512 Pain in left shoulder: Secondary | ICD-10-CM

## 2019-02-24 NOTE — Therapy (Signed)
[place], [place], [address] Phone: [phone]   Fax:  [phone]  Physical Therapy Treatment  Patient Details  Name: Ian Leblanc MRN: [id number] Date of Birth: [birth year] Referring Provider (PT): Delsa Grana , [place]   Encounter Date: 02/24/2019  PT End of Session - 02/24/19 1537    Visit Number  5    Number of Visits  12    Date for PT Re-Evaluation  03/20/19    PT Start Time  5364    PT Stop Time  1530    PT Time Calculation (min)  45 min    Activity Tolerance  Patient tolerated treatment well    Behavior During Therapy  [facility] for tasks assessed/performed       Past Medical History:  Diagnosis Date  . Hyperglycemia   . Hyperlipidemia   . Hypertension   . Sessile colonic polyp     Past Surgical History:  Procedure Laterality Date  . BACK SURGERY     cyst removal    There were no vitals filed for this visit.  Subjective Assessment - 02/24/19 1447    Subjective  Ya'll are getting me better.  I know its there but its not throbbing. Did not do much this weekend.         [facility] Adult PT Treatment/Exercise - 02/24/19 0001      Shoulder Exercises: Supine   Protraction  AROM;Both;10 reps    Horizontal ABduction  Strengthening;Both;15 reps    Theraband Level (Shoulder Horizontal ABduction)  Level 3 (Green)    External Rotation  Strengthening;Both;15 reps    Theraband Level (Shoulder External Rotation)  Level 3 (Green)    Flexion  Strengthening;Both;15 reps    Shoulder Flexion Weight (lbs)  narrow grip     Other Supine Exercises  all above ex done on foam roller       Shoulder Exercises: Standing   Other Standing Exercises  bent over row x 15 lbs KB min cues       Shoulder Exercises: ROM/Strengthening   Lat Pull Limitations  35 lbs x 10, cues for form     Pec Fly Limitations  reverse fly standing, 8 lbs x 8, 5 lbs x 10 , mod cues for flat back     Cybex Row Limitations  35 lbs x 15              PT Education - 02/24/19 1538    Education Details  form with lifting, working posterior chani, no pec /chest press for now    [employer]) Educated  Patient    Methods  Explanation    Comprehension  Verbalized understanding;Returned demonstration;Tactile cues required          PT Long Term Goals - 02/24/19 1448      PT LONG TERM GOAL #1   Title  Pt will be I with HEP for posture, flexibility and strength    Status  On-going      PT LONG TERM GOAL #2   Title  Pt will be able to sit with corrected posture without cues for optimal alignment      PT LONG TERM GOAL #3   Title  Pt will be able to improve cervical AROM to be painfree and WFL in all planes.    Baseline  lacks  end range R sidebending, L rotation but WFL      PT LONG TERM GOAL #4   Title  Pt will be  able to continue with upper body resistance training with good form and knowledge about stability.    Status  On-going      PT LONG TERM GOAL #5   Title  Pt will have no more than minimal pain at the end of the day (3/10, relieved with rest and stretching)    Status  On-going            Plan - 02/24/19 1535    Clinical Impression Statement  Pt notes improvement with functional mobility as well as comfort with work, rest and sleep.  He cont to have mild discomfort in L side of lower cervial spine.  Worked today on core, maintaining form with weights and proper form to prevent reinjury.  No pain post.    PT Treatment/Interventions  ADLs/Self Care Home Management;Cryotherapy;Ultrasound;Dry needling;Taping;Therapeutic activities;Electrical Stimulation;Moist Heat;Traction;Therapeutic exercise;Patient/family education;Manual techniques;Passive range of motion;Neuromuscular re-education;Functional mobility training    PT Next Visit Plan  update HEP for shoulder stabilizers, DN PRN, UBE, manual, continued inhibition taping,    PT Home Exercise Plan  chin tuck, scap squeeze, corner stretch, upper trap and levator  scap, lower trap wall y's, supine scapular stabilization, dumbbells reverse fly and 1 arm bent over row    Consulted and Agree with Plan of Care  Patient       Patient will benefit from skilled therapeutic intervention in order to improve the following deficits and impairments:  Decreased mobility, Hypomobility, Increased muscle spasms, Postural dysfunction, Pain, Impaired UE functional use, Impaired flexibility, Increased fascial restricitons, Decreased strength, Decreased range of motion  Visit Diagnosis: 1. Cervicalgia   2. Acute pain of left shoulder   3. Abnormal posture        Problem List Patient Active Problem List   Diagnosis Date Noted  . Cervical spine disease 02/04/2015  . Neck pain on left side 02/04/2015  . Paresthesia of left arm 02/04/2015  . Numbness of fingers 02/04/2015  . Screening for prostate cancer 12/09/2014  . Diabetes mellitus type 2, uncomplicated ([place]) 23/55/7322  . Chest pain 07/16/2013  . Abnormal EKG 07/16/2013  . Hyperglycemia   . HTN (hypertension) 10/10/2012  . HLD (hyperlipidemia) 10/10/2012  . History of colonic polyps 10/10/2012    , 02/24/2019, 3:39 PM  [facility] [address] [place], [place], [address] Phone: [phone]   Fax:  [phone]  Name: Ian Leblanc MRN: [id number] Date of Birth: 1961/02/22    Raeford Razor, PT 02/24/19 3:39 PM Phone: [phone] Fax: [phone]

## 2019-02-27 ENCOUNTER — Encounter: Payer: Self-pay | Admitting: Physical Therapy

## 2019-02-27 ENCOUNTER — Ambulatory Visit: Payer: 59 | Admitting: Physical Therapy

## 2019-02-27 ENCOUNTER — Other Ambulatory Visit: Payer: Self-pay

## 2019-02-27 DIAGNOSIS — R293 Abnormal posture: Secondary | ICD-10-CM

## 2019-02-27 DIAGNOSIS — M25512 Pain in left shoulder: Secondary | ICD-10-CM

## 2019-02-27 DIAGNOSIS — M542 Cervicalgia: Secondary | ICD-10-CM | POA: Diagnosis not present

## 2019-02-27 NOTE — Therapy (Signed)
[place], [place], [address] Phone: [phone]   Fax:  [phone]  Physical Therapy Treatment  Patient Details  Name: Ian Leblanc MRN: [id number] Date of Birth: [birth year] Referring Provider (PT): Delsa Grana , [place]   Encounter Date: 02/27/2019  PT End of Session - 02/27/19 1130    Visit Number  6    Number of Visits  12    Date for PT Re-Evaluation  03/20/19    PT Start Time  1128    PT Stop Time  1216    PT Time Calculation (min)  48 min    Activity Tolerance  Patient tolerated treatment well    Behavior During Therapy  [facility] for tasks assessed/performed       Past Medical History:  Diagnosis Date  . Hyperglycemia   . Hyperlipidemia   . Hypertension   . Sessile colonic polyp     Past Surgical History:  Procedure Laterality Date  . BACK SURGERY     cyst removal    There were no vitals filed for this visit.  Subjective Assessment - 02/27/19 1128    Subjective  It s light ache today, nothing like it was.  I getting a shooting pain once in awhile.    Currently in Pain?  No/denies         [facility] Adult PT Treatment/Exercise - 02/27/19 0001      Neck Exercises: Standing   Upper Extremity D1  Flexion;Extension;10 reps    UE D1 Weights (lbs)  1 plate [employer]     Other Standing Exercises  2 plates for D2 extension x 15 each , min cues     Other Standing Exercises  Flexion 2 plates each UE x 10       Shoulder Exercises: Standing   Horizontal ABduction  Strengthening;Both;10 reps    Horizontal ABduction Weight (lbs)  2 plates     Extension  Strengthening;Both;15 reps    Extension Weight (lbs)  2 plates     Other Standing Exercises  reverse fly blue band in doorway x 10     Other Standing Exercises  TRX low row x 10 and triceps press x 10       Shoulder Exercises: ROM/Strengthening   UBE (Upper Arm Bike)  L3  for 6 min (3 min each direction)       Moist Heat Therapy   Number  Minutes Moist Heat  10 Minutes    Moist Heat Location  Cervical      Neck Exercises: Stretches   Levator Stretch  2 reps;20 seconds    Corner Stretch  3 reps;20 seconds                  PT Long Term Goals - 02/24/19 1448      PT LONG TERM GOAL #1   Title  Pt will be I with HEP for posture, flexibility and strength    Status  On-going      PT LONG TERM GOAL #2   Title  Pt will be able to sit with corrected posture without cues for optimal alignment      PT LONG TERM GOAL #3   Title  Pt will be able to improve cervical AROM to be painfree and WFL in all planes.    Baseline  lacks  end range R sidebending, L rotation but WFL      PT LONG TERM GOAL #4   Title  Pt will  be able to continue with upper body resistance training with good form and knowledge about stability.    Status  On-going      PT LONG TERM GOAL #5   Title  Pt will have no more than minimal pain at the end of the day (3/10, relieved with rest and stretching)    Status  On-going            Plan - 02/27/19 1130    PT Treatment/Interventions  ADLs/Self Care Home Management;Cryotherapy;Ultrasound;Dry needling;Taping;Therapeutic activities;Electrical Stimulation;Moist Heat;Traction;Therapeutic exercise;Patient/family education;Manual techniques;Passive range of motion;Neuromuscular re-education;Functional mobility training    PT Next Visit Plan  continue post chain strengt , DN PRN, UBE, manual, continued inhibition taping,    PT Home Exercise Plan  chin tuck, scap squeeze, corner stretch, upper trap and levator scap, lower trap wall y's, supine scapular stabilization, dumbbells reverse fly and 1 arm bent over row    Consulted and Agree with Plan of Care  Patient       Patient will benefit from skilled therapeutic intervention in order to improve the following deficits and impairments:  Decreased mobility, Hypomobility, Increased muscle spasms, Postural dysfunction, Pain, Impaired UE functional use,  Impaired flexibility, Increased fascial restricitons, Decreased strength, Decreased range of motion  Visit Diagnosis: 1. Cervicalgia   2. Acute pain of left shoulder   3. Abnormal posture        Problem List Patient Active Problem List   Diagnosis Date Noted  . Cervical spine disease 02/04/2015  . Neck pain on left side 02/04/2015  . Paresthesia of left arm 02/04/2015  . Numbness of fingers 02/04/2015  . Screening for prostate cancer 12/09/2014  . Diabetes mellitus type 2, uncomplicated ([place]) 31/54/0086  . Chest pain 07/16/2013  . Abnormal EKG 07/16/2013  . Hyperglycemia   . HTN (hypertension) 10/10/2012  . HLD (hyperlipidemia) 10/10/2012  . History of colonic polyps 10/10/2012    Oshae Simmering 02/27/2019, 12:09 PM  [facility] [address] [place], [place], [address] Phone: [phone]   Fax:  [phone]  Name: Ian Leblanc MRN: [id number] Date of Birth: Jun 04, 1961  Raeford Razor, PT 02/27/19 12:09 PM Phone: [phone] Fax: [phone]

## 2019-03-03 ENCOUNTER — Encounter: Payer: 59 | Admitting: Physical Therapy

## 2019-03-05 ENCOUNTER — Encounter: Payer: 59 | Admitting: Physical Therapy

## 2019-03-10 ENCOUNTER — Other Ambulatory Visit: Payer: Self-pay

## 2019-03-10 ENCOUNTER — Encounter: Payer: Self-pay | Admitting: Physical Therapy

## 2019-03-10 ENCOUNTER — Ambulatory Visit: Payer: 59 | Admitting: Physical Therapy

## 2019-03-10 DIAGNOSIS — M25512 Pain in left shoulder: Secondary | ICD-10-CM

## 2019-03-10 DIAGNOSIS — M542 Cervicalgia: Secondary | ICD-10-CM

## 2019-03-10 DIAGNOSIS — R293 Abnormal posture: Secondary | ICD-10-CM

## 2019-03-10 NOTE — Therapy (Signed)
[place], [place], [address] Phone: [phone]   Fax:  [phone]  Physical Therapy Treatment  Patient Details  Name: Ian Leblanc MRN: [id number] Date of Birth: 25-Oct-1960 Referring Provider (PT): Delsa Grana , [place]   Encounter Date: 03/10/2019  PT End of Session - 03/10/19 1713    Visit Number  7    Number of Visits  12    Date for PT Re-Evaluation  03/20/19    PT Start Time  6962    PT Stop Time  1615    PT Time Calculation (min)  45 min    Activity Tolerance  Patient tolerated treatment well    Behavior During Therapy  [facility] for tasks assessed/performed       Past Medical History:  Diagnosis Date  . Hyperglycemia   . Hyperlipidemia   . Hypertension   . Sessile colonic polyp     Past Surgical History:  Procedure Laterality Date  . BACK SURGERY     cyst removal    There were no vitals filed for this visit.  Subjective Assessment - 03/10/19 1710    Subjective  Pt. returns back from trip out of town to [employer]. still with some neck discomfort but reports it is much better than we he started PT. He also reports some recent soreness in left rhomboid region.    Currently in Pain?  Yes    Pain Score  2     Pain Location  Neck    Pain Orientation  Left    Pain Descriptors / Indicators  Aching;Sore    Pain Type  Chronic pain    Pain Onset  More than a month ago    Pain Frequency  Intermittent    Aggravating Factors   sitting back against    Pain Relieving Factors  heating pad, pillows         [facility] PT Assessment - 03/10/19 0001      AROM   Cervical Flexion  60    Cervical Extension  30    Cervical - Right Side Bend  30    Cervical - Left Side Bend  28    Cervical - Right Rotation  65    Cervical - Left Rotation  62      Strength   Overall Strength Comments  L triceps 4+/5 otherwise grossly [facility]                   [facility] Adult PT Treatment/Exercise - 03/10/19 0001       Shoulder Exercises: Standing   Horizontal ABduction  Strengthening;Both;20 reps    Horizontal ABduction Limitations  "T" with TRX 2x10    Other Standing Exercises  TRX low row 2x10, TRX tricep extension 2x10      Shoulder Exercises: ROM/Strengthening   UBE (Upper Arm Bike)  L3 x 4 min (2 min ea  fw/rev-timed limited due to time spent with manual therapy and dry needling)      Manual Therapy   Manual therapy comments  skilled palpation and monitoring of pt. throughout TPDN    Joint Mobilization  cervical PAs grade I-III, thoracic PAs grade I-IV    Soft tissue mobilization  cervical paraspinals, left upper trapezius and rhomboid in prone       Trigger Point Dry Needling - 03/10/19 0001    Consent Given?  Yes    Muscles Treated Head and Neck  Upper trapezius;Semispinalis capitus;Cervical multifidi    Muscles  Treated Upper Quadrant  Rhomboids    Dry Needling Comments  all needling in prone with 30 gauge 40 mm needles, rhomboid needling done in hammerlock position with transverse insertion toward scapul    Upper Trapezius Response  Twitch reponse elicited           PT Education - 03/10/19 1712    Education Details  exercise form    Person(s) Educated  Patient    Methods  Explanation;Demonstration;Verbal cues;Tactile cues    Comprehension  Verbalized understanding;Returned demonstration;Verbal cues required;Tactile cues required          PT Long Term Goals - 03/10/19 1717      PT LONG TERM GOAL #1   Title  Pt will be I with HEP for posture, flexibility and strength    Baseline  updates ongoing    Time  6    Period  Weeks    Status  On-going      PT LONG TERM GOAL #2   Title  Pt will be able to sit with corrected posture without cues for optimal alignment    Baseline  min cues needed    Time  6    Period  Weeks    Status  On-going      PT LONG TERM GOAL #3   Title  Pt will be able to improve cervical AROM to be painfree and WFL in all planes.    Baseline  Mild  lack of rotation but WFL-see objective    Time  6    Period  Weeks    Status  On-going      PT LONG TERM GOAL #4   Title  Pt will be able to continue with upper body resistance training with good form and knowledge about stability.    Time  6    Period  Weeks    Status  On-going      PT LONG TERM GOAL #5   Title  Pt will have no more than minimal pain at the end of the day (3/10, relieved with rest and stretching)    Time  6    Period  Weeks    Status  On-going            Plan - 03/10/19 1714    Clinical Impression Statement  Pt. improving from baseline status with decreased cervical pain and functional gains for positional tolerance. Still with some postural/posterior chain periscapular weakness contributing to cervical region tightness but improving from baseline status with this.    Personal Factors and Comorbidities  Comorbidity 1;Past/Current Experience;Profession    Comorbidities  HTN, DM (well controlled)    Examination-Activity Limitations  Sleep;Lift;Reach Overhead;Bed Mobility    Examination-Participation Restrictions  Yard Work;Community Activity    Stability/Clinical Decision Making  Stable/Uncomplicated    Clinical Decision Making  Low    Rehab Potential  Excellent    PT Frequency  2x / week    PT Duration  6 weeks    PT Treatment/Interventions  ADLs/Self Care Home Management;Cryotherapy;Ultrasound;Dry needling;Taping;Therapeutic activities;Electrical Stimulation;Moist Heat;Traction;Therapeutic exercise;Patient/family education;Manual techniques;Passive range of motion;Neuromuscular re-education;Functional mobility training    PT Next Visit Plan  continue post chain strength , DN PRN, UBE, manual, continued inhibition taping as needed    PT Home Exercise Plan  chin tuck, scap squeeze, corner stretch, upper trap and levator scap, lower trap wall y's, supine scapular stabilization, dumbbells reverse fly and 1 arm bent over row    Consulted and Agree with Plan of Care  Patient       Patient will benefit from skilled therapeutic intervention in order to improve the following deficits and impairments:  Decreased mobility, Hypomobility, Increased muscle spasms, Postural dysfunction, Pain, Impaired UE functional use, Impaired flexibility, Increased fascial restricitons, Decreased strength, Decreased range of motion  Visit Diagnosis: 1. Cervicalgia   2. Acute pain of left shoulder   3. Abnormal posture        Problem List Patient Active Problem List   Diagnosis Date Noted  . Cervical spine disease 02/04/2015  . Neck pain on left side 02/04/2015  . Paresthesia of left arm 02/04/2015  . Numbness of fingers 02/04/2015  . Screening for prostate cancer 12/09/2014  . Diabetes mellitus type 2, uncomplicated ([place]) 53/66/4403  . Chest pain 07/16/2013  . Abnormal EKG 07/16/2013  . Hyperglycemia   . HTN (hypertension) 10/10/2012  . HLD (hyperlipidemia) 10/10/2012  . History of colonic polyps 10/10/2012    Beaulah Dinning, PT, DPT 03/10/19 5:20 PM  [facility] [address] [place], [place], [address] Phone: [phone]   Fax:  [phone]  Name: Jamont Mellin MRN: [id number] Date of Birth: 12-Aug-1961

## 2019-03-12 ENCOUNTER — Ambulatory Visit: Payer: 59 | Admitting: Physical Therapy

## 2019-03-12 ENCOUNTER — Other Ambulatory Visit: Payer: Self-pay

## 2019-03-12 ENCOUNTER — Encounter: Payer: Self-pay | Admitting: Physical Therapy

## 2019-03-12 DIAGNOSIS — M542 Cervicalgia: Secondary | ICD-10-CM

## 2019-03-12 DIAGNOSIS — R293 Abnormal posture: Secondary | ICD-10-CM

## 2019-03-12 DIAGNOSIS — M25512 Pain in left shoulder: Secondary | ICD-10-CM

## 2019-03-12 NOTE — Therapy (Signed)
[place], [place], [address] Phone: [phone]   Fax:  [phone]  Physical Therapy Treatment  Patient Details  Name: Ian Leblanc MRN: [id number] Date of Birth: [birth year] Referring Provider (PT): Delsa Grana , [place]   Encounter Date: 03/12/2019  PT End of Session - 03/12/19 1657    Visit Number  8    Number of Visits  12    Date for PT Re-Evaluation  03/20/19    PT Start Time  6659    PT Stop Time  1655    PT Time Calculation (min)  38 min    Activity Tolerance  Patient tolerated treatment well    Behavior During Therapy  [facility] for tasks assessed/performed       Past Medical History:  Diagnosis Date  . Hyperglycemia   . Hyperlipidemia   . Hypertension   . Sessile colonic polyp     Past Surgical History:  Procedure Laterality Date  . BACK SURGERY     cyst removal    There were no vitals filed for this visit.  Subjective Assessment - 03/12/19 1621    Subjective  "I am doing much better, really much better"    Patient Stated Goals  Patient would like to have less pain    Currently in Pain?  Yes    Pain Score  2     Pain Orientation  Left    Pain Type  Chronic pain    Pain Onset  More than a month ago    Pain Frequency  Intermittent         [facility] PT Assessment - 03/12/19 0001      Assessment   Medical Diagnosis  L trapezius strain     Referring Provider (PT)  Delsa Grana , PA-C                   [facility] Adult PT Treatment/Exercise - 03/12/19 0001      Shoulder Exercises: Prone   Other Prone Exercises  I's, T's and Y's 2 x 12 with 2#      Shoulder Exercises: ROM/Strengthening   UBE (Upper Arm Bike)  L3 x 4 min   changing direction at 2 min     Shoulder Exercises: Stretch   Other Shoulder Stretches  rhomboids stretch 2 x 30 sec      Manual Therapy   Manual therapy comments  skilled palpation and monitoring of pt. throughout TPDN    Joint Mobilization  T1-T8 PA  grade III    Soft tissue mobilization  IASTM along L upper trap/ rhomboids, lower trap and thoracic erector spinae      Neck Exercises: Stretches   Levator Stretch  2 reps;30 seconds    Corner Stretch  2 reps;30 seconds       Trigger Point Dry Needling - 03/12/19 0001    Consent Given?  Yes    Education Handout Provided  Previously provided    Muscles Treated Back/Hip  Erector spinae    Upper Trapezius Response  Twitch reponse elicited    Erector spinae Response  Twitch response elicited;Palpable increased muscle length   L T6-T7          PT Education - 03/12/19 1657    Education Details  updated HEP for I's, T's, and Y's    Person(s) Educated  Patient    Methods  Explanation;Verbal cues;Handout    Comprehension  Verbalized understanding;Verbal cues required  PT Long Term Goals - 03/10/19 1717      PT LONG TERM GOAL #1   Title  Pt will be I with HEP for posture, flexibility and strength    Baseline  updates ongoing    Time  6    Period  Weeks    Status  On-going      PT LONG TERM GOAL #2   Title  Pt will be able to sit with corrected posture without cues for optimal alignment    Baseline  min cues needed    Time  6    Period  Weeks    Status  On-going      PT LONG TERM GOAL #3   Title  Pt will be able to improve cervical AROM to be painfree and WFL in all planes.    Baseline  Mild lack of rotation but WFL-see objective    Time  6    Period  Weeks    Status  On-going      PT LONG TERM GOAL #4   Title  Pt will be able to continue with upper body resistance training with good form and knowledge about stability.    Time  6    Period  Weeks    Status  On-going      PT LONG TERM GOAL #5   Title  Pt will have no more than minimal pain at the end of the day (3/10, relieved with rest and stretching)    Time  6    Period  Weeks    Status  On-going            Plan - 03/12/19 1658    Clinical Impression Statement  [place] the beach  noting improvement in the back with minimal soreness rated at a 2/10. Continued TPDN followed with IASTM techniques and throacic mobs. continued working on on posterior chain strengthening which he did well with.    PT Treatment/Interventions  ADLs/Self Care Home Management;Cryotherapy;Ultrasound;Dry needling;Taping;Therapeutic activities;Electrical Stimulation;Moist Heat;Traction;Therapeutic exercise;Patient/family education;Manual techniques;Passive range of motion;Neuromuscular re-education;Functional mobility training    PT Next Visit Plan  continue post chain strength , DN PRN, UBE, manual, continued inhibition taping as needed    PT Home Exercise Plan  chin tuck, scap squeeze, corner stretch, upper trap and levator scap, lower trap wall y's, supine scapular stabilization, dumbbells reverse fly and 1 arm bent over row, I, Y and T's    Consulted and Agree with Plan of Care  Patient       Patient will benefit from skilled therapeutic intervention in order to improve the following deficits and impairments:  Decreased mobility, Hypomobility, Increased muscle spasms, Postural dysfunction, Pain, Impaired UE functional use, Impaired flexibility, Increased fascial restricitons, Decreased strength, Decreased range of motion  Visit Diagnosis: 1. Cervicalgia   2. Acute pain of left shoulder   3. Abnormal posture        Problem List Patient Active Problem List   Diagnosis Date Noted  . Cervical spine disease 02/04/2015  . Neck pain on left side 02/04/2015  . Paresthesia of left arm 02/04/2015  . Numbness of fingers 02/04/2015  . Screening for prostate cancer 12/09/2014  . Diabetes mellitus type 2, uncomplicated ([place]) 42/70/6237  . Chest pain 07/16/2013  . Abnormal EKG 07/16/2013  . Hyperglycemia   . HTN (hypertension) 10/10/2012  . HLD (hyperlipidemia) 10/10/2012  . History of colonic polyps 10/10/2012    Starr Lake PT, DPT, LAT, ATC  03/12/19  5:06  PM      [place], [place], [address] Phone: [phone]   Fax:  [phone]  Name: Ian Leblanc MRN: [id number] Date of Birth: Feb 25, 1961

## 2019-03-17 ENCOUNTER — Other Ambulatory Visit: Payer: Self-pay

## 2019-03-17 ENCOUNTER — Encounter: Payer: Self-pay | Admitting: Physical Therapy

## 2019-03-17 ENCOUNTER — Ambulatory Visit: Payer: 59 | Admitting: Physical Therapy

## 2019-03-17 DIAGNOSIS — M542 Cervicalgia: Secondary | ICD-10-CM

## 2019-03-17 DIAGNOSIS — R293 Abnormal posture: Secondary | ICD-10-CM

## 2019-03-17 DIAGNOSIS — M25512 Pain in left shoulder: Secondary | ICD-10-CM
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
## 2019-03-19 ENCOUNTER — Ambulatory Visit: Payer: 59 | Admitting: Physical Therapy

## 2019-03-19 ENCOUNTER — Encounter: Payer: Self-pay | Admitting: Physical Therapy

## 2019-03-19 ENCOUNTER — Other Ambulatory Visit: Payer: Self-pay

## 2019-03-19 DIAGNOSIS — M542 Cervicalgia: Secondary | ICD-10-CM | POA: Diagnosis not present

## 2019-03-19 DIAGNOSIS — M25512 Pain in left shoulder: Secondary | ICD-10-CM

## 2019-03-19 DIAGNOSIS — R293 Abnormal posture: Secondary | ICD-10-CM

## 2019-03-19 NOTE — Therapy (Signed)
[place], [place], [address] Phone: [phone]   Fax:  [phone]  Physical Therapy Treatment / Re-certification  Patient Details  Name: Ian Leblanc MRN: [id number] Date of Birth: 1961/01/26 Referring Provider (PT): Delsa Grana , PA-C   Encounter Date: 03/19/2019  PT End of Session - 03/19/19 1458    Visit Number  10    Number of Visits  12    Date for PT Re-Evaluation  04/16/19    PT Start Time  3875    PT Stop Time  1530    PT Time Calculation (min)  43 min    Activity Tolerance  Patient tolerated treatment well    Behavior During Therapy  [facility] for tasks assessed/performed       Past Medical History:  Diagnosis Date  . Hyperglycemia   . Hyperlipidemia   . Hypertension   . Sessile colonic polyp     Past Surgical History:  Procedure Laterality Date  . BACK SURGERY     cyst removal    There were no vitals filed for this visit.  Subjective Assessment - 03/19/19 1450    Subjective  " I am doing better, but I am still having pain inthe neck rated at 3/10"    Patient Stated Goals  Patient would like to have less pain    Currently in Pain?  Yes    Pain Score  3     Pain Location  Neck    Pain Orientation  Left    Pain Descriptors / Indicators  Aching;Throbbing    Pain Type  Chronic pain    Pain Onset  More than a month ago    Pain Frequency  Intermittent    Aggravating Factors   leaning forward    Pain Relieving Factors  heating pad, pillows         [facility] PT Assessment - 03/19/19 1454      Assessment   Medical Diagnosis  L trapezius strain     Referring Provider (PT)  Delsa Grana , PA-C      Observation/Other Assessments   Focus on Therapeutic Outcomes (FOTO)   32% limited      AROM   Cervical Flexion  60    Cervical Extension  30    Cervical - Right Side Bend  43    Cervical - Left Side Bend  43    Cervical - Right Rotation  62    Cervical - Left Rotation  58                    [facility] Adult PT Treatment/Exercise - 03/19/19 0001      Shoulder Exercises: Seated   Other Seated Exercises  lower trap Y's with in 2 x 10      Shoulder Exercises: Prone   Other Prone Exercises  I's, T's and Y's 2 x 12 with  no weight      Manual Therapy   Manual therapy comments  skilled palpation and monitoring of pt. throughout TPDN    Joint Mobilization  C5-C7 ,T1-T8 grade III PA    Soft tissue mobilization  IASTM along L upper trap/ rhomboids, lower trap and thoracic erector spinae      Neck Exercises: Stretches   Levator Stretch  2 reps;30 seconds    Corner Stretch  2 reps;30 seconds       Trigger Point Dry Needling - 03/19/19 0001    Consent Given?  Yes  Education Handout Provided  Previously provided    Muscles Treated Head and Neck  Semispinalis capitus    Upper Trapezius Response  Twitch reponse elicited;Palpable increased muscle length    Levator Scapulae Response  Twitch response elicited;Palpable increased muscle length    Semispinalis capitus Response  Twitch reponse elicited;Palpable increased muscle length           PT Education - 03/19/19 1524    Education Details  reviewed previously provided HEP and discussed POC    Person(s) Educated  Patient    Methods  Explanation;Verbal cues    Comprehension  Verbalized understanding;Verbal cues required          PT Long Term Goals - 03/19/19 1456      PT LONG TERM GOAL #1   Title  Pt will be I with HEP for posture, flexibility and strength    Time  6    Period  Weeks    Status  On-going    Target Date  04/02/19      PT LONG TERM GOAL #2   Title  Pt will be able to sit with corrected posture without cues for optimal alignment    Period  Weeks    Status  Partially Met      PT LONG TERM GOAL #3   Title  Pt will be able to improve cervical AROM to be painfree and WFL in all planes.    Time  6    Status  On-going      PT LONG TERM GOAL #4   Title  Pt will be able to  continue with upper body resistance training with good form and knowledge about stability.    Period  Weeks    Status  On-going      PT LONG TERM GOAL #5   Title  Pt will have no more than minimal pain at the end of the day (3/10, relieved with rest and stretching)    Time  6    Period  Weeks    Status  On-going            Plan - 03/19/19 1509    Clinical Impression Statement  Mr Elicker is making progress with physical therapy increasing cervical mobility and additionally reports decreased pain. He did have a slight set back over the past weekened with an issue with his truck. He is making progress toward all LTGs. continued TPDN today focusing on the L posterior neck musculature. continued STW and cervicothoracic mobs. He performed shoulder strengthening well. plan to see pt 1 x a week for the next 2 weeks to finalize HEP, work toward remaining goals and independent exercise.    Rehab Potential  Good    PT Frequency  1x / week    PT Duration  2 weeks    PT Treatment/Interventions  ADLs/Self Care Home Management;Cryotherapy;Ultrasound;Dry needling;Taping;Therapeutic activities;Electrical Stimulation;Moist Heat;Traction;Therapeutic exercise;Patient/family education;Manual techniques;Passive range of motion;Neuromuscular re-education;Functional mobility training    PT Next Visit Plan  continue post chain strength , DN PRN, UBE, manual, posture with lifting    PT Home Exercise Plan  chin tuck, scap squeeze, corner stretch, upper trap and levator scap, lower trap wall y's, supine scapular stabilization, dumbbells reverse fly and 1 arm bent over row, I, Y and T's       Patient will benefit from skilled therapeutic intervention in order to improve the following deficits and impairments:  Decreased mobility, Hypomobility, Increased muscle spasms, Postural dysfunction, Pain, Impaired UE functional use,  Impaired flexibility, Increased fascial restricitons, Decreased strength, Decreased range of  motion  Visit Diagnosis: 1. Cervicalgia   2. Acute pain of left shoulder   3. Abnormal posture        Problem List Patient Active Problem List   Diagnosis Date Noted  . Cervical spine disease 02/04/2015  . Neck pain on left side 02/04/2015  . Paresthesia of left arm 02/04/2015  . Numbness of fingers 02/04/2015  . Screening for prostate cancer 12/09/2014  . Diabetes mellitus type 2, uncomplicated ([place]) 04/88/8916  . Chest pain 07/16/2013  . Abnormal EKG 07/16/2013  . Hyperglycemia   . HTN (hypertension) 10/10/2012  . HLD (hyperlipidemia) 10/10/2012  . History of colonic polyps 10/10/2012     Starr Lake PT, DPT, LAT, ATC  03/19/19  3:32 PM      [place] [facility] [address] [place], [place], [address] Phone: [phone]   Fax:  [phone]  Name: Pasco Marchitto MRN: [id number] Date of Birth: May 19, 1961

## 2019-03-26 ENCOUNTER — Ambulatory Visit: Payer: 59 | Attending: Family Medicine | Admitting: Physical Therapy

## 2019-03-26 ENCOUNTER — Encounter: Payer: Self-pay | Admitting: Physical Therapy

## 2019-03-26 ENCOUNTER — Other Ambulatory Visit: Payer: Self-pay

## 2019-03-26 DIAGNOSIS — R293 Abnormal posture: Secondary | ICD-10-CM

## 2019-03-26 DIAGNOSIS — M25512 Pain in left shoulder: Secondary | ICD-10-CM | POA: Diagnosis present

## 2019-03-26 DIAGNOSIS — M542 Cervicalgia: Secondary | ICD-10-CM

## 2019-03-26 NOTE — Therapy (Signed)
[place], [place], [address] Phone: [phone]   Fax:  [phone]  Physical Therapy Treatment  Patient Details  Name: Ian Leblanc MRN: [id number] Date of Birth: 04/28/1961 Referring Provider (PT): Delsa Grana , [place]   Encounter Date: 03/26/2019  PT End of Session - 03/26/19 1543    Visit Number  11    Number of Visits  12    Date for PT Re-Evaluation  04/16/19    PT Start Time  9211    PT Stop Time  1650    PT Time Calculation (min)  67 min    Activity Tolerance  Patient tolerated treatment well    Behavior During Therapy  [facility] for tasks assessed/performed       Past Medical History:  Diagnosis Date  . Hyperglycemia   . Hyperlipidemia   . Hypertension   . Sessile colonic polyp     Past Surgical History:  Procedure Laterality Date  . BACK SURGERY     cyst removal    There were no vitals filed for this visit.  Subjective Assessment - 03/26/19 1544    Subjective  Pt reports he is having some return of symptoms in the Lt upper trap and closer to the spine.    Patient Stated Goals  Patient would like to have less pain    Currently in Pain?  Yes    Pain Score  4     Pain Location  Neck    Pain Orientation  Left    Pain Descriptors / Indicators  Sore;Throbbing    Pain Type  Chronic pain    Pain Onset  More than a month ago    Pain Frequency  Intermittent    Aggravating Factors   leaning forward and backwards in a chair    Pain Relieving Factors  heat         [facility] PT Assessment - 03/26/19 0001      Assessment   Medical Diagnosis  L trapezius strain     Referring Provider (PT)  Delsa Grana , PA-C                   [facility] Adult PT Treatment/Exercise - 03/26/19 0001      Shoulder Exercises: Supine   Other Supine Exercises  lying on full bolster angel stretch followed by shoulder rotation into ER to upright , snow angel stretches the roller cross ways at level of scapula.      Other Supine Exercises  head presses into ball off EOB, then shoulder presses.       Shoulder Exercises: ROM/Strengthening   UBE (Upper Arm Bike)  L3 x 6 min      Modalities   Modalities  Moist Heat      Moist Heat Therapy   Number Minutes Moist Heat  10 Minutes    Moist Heat Location  Cervical;Shoulder      Manual Therapy   Manual therapy comments  skilled palpation and monitoring of pt. throughout TPDN    Soft tissue mobilization  STM to cervical paraspinals and around Lt shoulder complex.        Trigger Point Dry Needling - 03/26/19 0001    Consent Given?  Yes    Education Handout Provided  Previously provided    Muscles Treated Head and Neck  Upper trapezius;Levator scapulae;Cervical multifidi   all letf side   Muscles Treated Upper Quadrant  Infraspinatus;Teres major;Teres minor;Subclavius   all left side  Upper Trapezius Response  Palpable increased muscle length;Twitch reponse elicited    Levator Scapulae Response  Twitch response elicited;Palpable increased muscle length    Cervical multifidi Response  Palpable increased muscle length;Twitch reponse elicited   Q2-0   Infraspinatus Response  Palpable increased muscle length;Twitch response elicited    Teres major Response  Twitch response elicited;Palpable increased muscle length    Teres minor Response  Twitch response elicited;Palpable increased muscle length    Subclavius Response  Twitch response elicited;Palpable increased muscle length           PT Education - 03/26/19 1646    Education Details  chest and thoracic openers on foam roller    Person(s) Educated  Patient    Methods  Explanation;Demonstration;Handout    Comprehension  Returned demonstration;Verbalized understanding          PT Long Term Goals - 03/19/19 1456      PT LONG TERM GOAL #1   Title  Pt will be I with HEP for posture, flexibility and strength    Time  6    Period  Weeks    Status  On-going    Target Date  04/02/19       PT LONG TERM GOAL #2   Title  Pt will be able to sit with corrected posture without cues for optimal alignment    Period  Weeks    Status  Partially Met      PT LONG TERM GOAL #3   Title  Pt will be able to improve cervical AROM to be painfree and WFL in all planes.    Time  6    Status  On-going      PT LONG TERM GOAL #4   Title  Pt will be able to continue with upper body resistance training with good form and knowledge about stability.    Period  Weeks    Status  On-going      PT LONG TERM GOAL #5   Title  Pt will have no more than minimal pain at the end of the day (3/10, relieved with rest and stretching)    Time  6    Period  Weeks    Status  On-going            Plan - 03/26/19 1651    Clinical Impression Statement  [name] had decreased tightness following openers on the foam roller and DN/manual therapy.  He has a lot of tightness in the teres and infraspinatus area that may be pulling the scapula out to the side and causing increased tension on the neck.  [place] and [place] the following.    Rehab Potential  Good    PT Frequency  1x / week    PT Duration  2 weeks    PT Treatment/Interventions  ADLs/Self Care Home Management;Cryotherapy;Ultrasound;Dry needling;Taping;Therapeutic activities;Electrical Stimulation;Moist Heat;Traction;Therapeutic exercise;Patient/family education;Manual techniques;Passive range of motion;Neuromuscular re-education;Functional mobility training    PT Next Visit Plan  finalize HEP and instruction.    Consulted and Agree with Plan of Care  Patient       Patient will benefit from skilled therapeutic intervention in order to improve the following deficits and impairments:  Decreased mobility, Hypomobility, Increased muscle spasms, Postural dysfunction, Pain, Impaired UE functional use, Impaired flexibility, Increased fascial restricitons, Decreased strength, Decreased range of motion  Visit Diagnosis: 1.  Cervicalgia   2. Acute pain of left shoulder   3. Abnormal posture  Problem List Patient Active Problem List   Diagnosis Date Noted  . Cervical spine disease 02/04/2015  . Neck pain on left side 02/04/2015  . Paresthesia of left arm 02/04/2015  . Numbness of fingers 02/04/2015  . Screening for prostate cancer 12/09/2014  . Diabetes mellitus type 2, uncomplicated ([place]) 00/86/7619  . Chest pain 07/16/2013  . Abnormal EKG 07/16/2013  . Hyperglycemia   . HTN (hypertension) 10/10/2012  . HLD (hyperlipidemia) 10/10/2012  . History of colonic polyps 10/10/2012    Jeral Pinch PT  03/26/2019, 4:53 PM  [facility] [address] [place], [place], [address] Phone: [phone]   Fax:  [phone]  Name: Ian Leblanc MRN: [id number] Date of Birth: 1960/11/14

## 2019-03-28 ENCOUNTER — Ambulatory Visit: Payer: 59 | Admitting: Family Medicine

## 2019-04-01 ENCOUNTER — Ambulatory Visit: Payer: 59 | Admitting: Family Medicine

## 2019-04-09 ENCOUNTER — Ambulatory Visit: Payer: 59 | Admitting: Physical Therapy

## 2019-04-09 ENCOUNTER — Encounter: Payer: Self-pay | Admitting: Physical Therapy

## 2019-04-09 ENCOUNTER — Other Ambulatory Visit: Payer: Self-pay

## 2019-04-09 DIAGNOSIS — R293 Abnormal posture: Secondary | ICD-10-CM

## 2019-04-09 DIAGNOSIS — M25512 Pain in left shoulder: Secondary | ICD-10-CM

## 2019-04-09 DIAGNOSIS — M542 Cervicalgia: Secondary | ICD-10-CM

## 2019-04-09 NOTE — Therapy (Signed)
[place], [place], [address] Phone: [phone]   Fax:  [phone]  Physical Therapy Treatment / Re-Certification  Patient Details  Name: Ian Leblanc MRN: [id number] Date of Birth: 02-16-1961 Referring Provider (PT): Ian Leblanc , [place]   Encounter Date: 04/09/2019  PT End of Session - 04/09/19 1418    Visit Number  12    Number of Visits  15    Date for PT Re-Evaluation  05/07/19    PT Start Time  9242    PT Stop Time  1454    PT Time Calculation (min)  38 min    Activity Tolerance  Patient tolerated treatment well    Behavior During Therapy  [facility] for tasks assessed/performed       Past Medical History:  Diagnosis Date  . Hyperglycemia   . Hyperlipidemia   . Hypertension   . Sessile colonic polyp     Past Surgical History:  Procedure Laterality Date  . BACK SURGERY     cyst removal    There were no vitals filed for this visit.  Subjective Assessment - 04/09/19 1418    Subjective  " I am getting better but the pain is still 2-3/10 which occurs with sleeping and prolonged position"    Patient Stated Goals  Patient would like to have less pain    Currently in Pain?  Yes    Pain Score  2     Pain Orientation  Left    Pain Descriptors / Indicators  Aching         [facility] PT Assessment - 04/09/19 0001      Assessment   Medical Diagnosis  L trapezius strain     Referring Provider (PT)  Ian Leblanc , PA-C      AROM   Cervical Flexion  54    Cervical Extension  42    Cervical - Right Side Bend  46    Cervical - Left Side Bend  50    Cervical - Right Rotation  64    Cervical - Left Rotation  69                   [facility] Adult PT Treatment/Exercise - 04/09/19 0001      Neck Exercises: Prone   Other Prone Exercise  chin tuck with cervical retraction holding 10 sec      Manual Therapy   Manual therapy comments  skilled palpation and monitoring of pt. throughout [facility]    Joint Mobilization  C5-C7 ,T1-T8 grade III PA    Soft tissue mobilization  IASTM to cervical paraspinals and around Lt shoulder complex.     Other Manual Therapy  upper trap inhibition taping      Neck Exercises: Stretches   Levator Stretch  2 reps;30 seconds    Corner Stretch  2 reps;30 seconds       Trigger Point Dry Needling - 04/09/19 0001    Consent Given?  Yes    Education Handout Provided  Previously provided    Electrical Stimulation Performed with Dry Needling  Yes    E-stim with Dry Needling Details  frequency at 18, increasing to tolerace x 4 min, increase to tolerance again for another 4 min    Upper Trapezius Response  Palpable increased muscle length;Twitch reponse elicited    Infraspinatus Response  Palpable increased muscle length;Twitch response elicited           PT Education - 04/09/19  [id number]    Education Details  reviewed previously provided HEP and benfits of E-stim combined with DN.    Person(s) Educated  Patient    Methods  Explanation;Verbal cues;Handout    Comprehension  Verbalized understanding;Verbal cues required          PT Long Term Goals - 04/09/19 1426      PT LONG TERM GOAL #1   Title  Pt will be I with HEP for posture, flexibility and strength    Time  6    Period  Weeks    Status  On-going    Target Date  05/07/19      PT LONG TERM GOAL #2   Title  Pt will be able to sit with corrected posture without cues for optimal alignment    Period  Weeks    Status  Partially Met    Target Date  05/07/19      PT LONG TERM GOAL #3   Title  Pt will be able to improve cervical AROM to be painfree and WFL in all planes.    Time  6    Period  Weeks    Status  On-going    Target Date  05/07/19      PT LONG TERM GOAL #4   Title  Pt will be able to continue with upper body resistance training with good form and knowledge about stability.    Time  6    Period  Weeks    Status  On-going    Target Date  05/07/19      PT LONG TERM GOAL #5    Title  Pt will have no more than minimal pain at the end of the day (3/10, relieved with rest and stretching)    Period  Weeks    Status  On-going    Target Date  05/07/19            Plan - 04/09/19 1436    Clinical Impression Statement  Ian Leblanc is make great progress with physical therapy, he does continue to report pain with rowing noting increased pain that stays at a 2/10. Continued TPDN on the infraspinatus and the L upper trap combined with E-stim. continued shoulder stretching and cervical stabliity with he did well with. plan to see pt 1 x a week for the next 3 weeks to finalize HEP addressing remaining deficist and remaining goals.    PT Frequency  1x / week    PT Duration  3 weeks    PT Treatment/Interventions  ADLs/Self Care Home Management;Cryotherapy;Ultrasound;Dry needling;Taping;Therapeutic activities;Electrical Stimulation;Moist Heat;Traction;Therapeutic exercise;Patient/family education;Manual techniques;Passive range of motion;Neuromuscular re-education;Functional mobility training    PT Next Visit Plan  finalize HEP and instruction. posture, cervical stability    PT Home Exercise Plan  chin tuck, scap squeeze, corner stretch, upper trap and levator scap, lower trap wall y's, supine scapular stabilization, dumbbells reverse fly and 1 arm bent over row, I, Y and T's       Patient will benefit from skilled therapeutic intervention in order to improve the following deficits and impairments:  Decreased mobility, Hypomobility, Increased muscle spasms, Postural dysfunction, Pain, Impaired UE functional use, Impaired flexibility, Increased fascial restricitons, Decreased strength, Decreased range of motion  Visit Diagnosis: 1. Cervicalgia   2. Acute pain of left shoulder   3. Abnormal posture        Problem List Patient Active Problem List   Diagnosis Date Noted  . Cervical spine disease 02/04/2015  . Neck pain  on left side 02/04/2015  . Paresthesia of left arm  02/04/2015  . Numbness of fingers 02/04/2015  . Screening for prostate cancer 12/09/2014  . Diabetes mellitus type 2, uncomplicated ([place]) 35/94/0905  . Chest pain 07/16/2013  . Abnormal EKG 07/16/2013  . Hyperglycemia   . HTN (hypertension) 10/10/2012  . HLD (hyperlipidemia) 10/10/2012  . History of colonic polyps 10/10/2012   Starr Lake PT, DPT, LAT, ATC  04/09/19  2:54 PM      [facility] [address] [place], [place], [address] Phone: [phone]   Fax:  [phone]  Name: Ian Leblanc MRN: [id number] Date of Birth: Jan 01, 1961

## 2019-04-23 ENCOUNTER — Other Ambulatory Visit: Payer: Self-pay

## 2019-04-23 ENCOUNTER — Encounter: Payer: Self-pay | Admitting: Family Medicine

## 2019-04-23 ENCOUNTER — Encounter: Payer: Self-pay | Admitting: Physical Therapy

## 2019-04-23 ENCOUNTER — Ambulatory Visit: Payer: 59 | Attending: Family Medicine | Admitting: Physical Therapy

## 2019-04-23 DIAGNOSIS — M542 Cervicalgia: Secondary | ICD-10-CM | POA: Insufficient documentation

## 2019-04-23 DIAGNOSIS — M25512 Pain in left shoulder: Secondary | ICD-10-CM

## 2019-04-23 DIAGNOSIS — R293 Abnormal posture: Secondary | ICD-10-CM | POA: Diagnosis present

## 2019-04-23 NOTE — Therapy (Signed)
[place], [place], [address] Phone: [phone]   Fax:  [phone]  Physical Therapy Treatment  Patient Details  Name: Ian Leblanc MRN: [id number] Date of Birth: 1961-04-26 Referring Provider (PT): Delsa Grana , [place]   Encounter Date: 04/23/2019  PT End of Session - 04/23/19 1408    Visit Number  13    Number of Visits  15    Date for PT Re-Evaluation  05/07/19    PT Start Time  9417    PT Stop Time  1448    PT Time Calculation (min)  40 min    Activity Tolerance  Patient tolerated treatment well    Behavior During Therapy  [facility] for tasks assessed/performed       Past Medical History:  Diagnosis Date  . Hyperglycemia   . Hyperlipidemia   . Hypertension   . Sessile colonic polyp     Past Surgical History:  Procedure Laterality Date  . BACK SURGERY     cyst removal    There were no vitals filed for this visit.  Subjective Assessment - 04/23/19 1410    Subjective  "I am really feeling much better today"    Patient Stated Goals  Patient would like to have less pain    Currently in Pain?  No/denies         [facility] PT Assessment - 04/23/19 0001      Assessment   Medical Diagnosis  L trapezius strain     Referring Provider (PT)  Delsa Grana , PA-C                   [facility] Adult PT Treatment/Exercise - 04/23/19 0001      Shoulder Exercises: Supine   Other Supine Exercises  foam roll routnine: 2x 15 with alternating L/R ceiling punches and x to y with red theraband      Shoulder Exercises: ROM/Strengthening   UBE (Upper Arm Bike)  L4 x 6 min   changing directionat 3 min     Shoulder Exercises: Stretch   Other Shoulder Stretches  rhomboids stretch 2 x 30 sec    Other Shoulder Stretches  upper trap/ levator scapulae stretching 2 x 30 sec      Shoulder Exercises: Power Therapist, occupational press 2 x 15 35#    Other Power Engineer, water  row 2 x 15  with 35#      Shoulder Exercises: Body Blade   Other Body Blade Exercises  lateral abduction 3 x 30, forward flexion 3 x 30 sec    Other Body Blade Exercises  Ir/ ER 3 x 30 sec   bicep/ tricep 3 x 30 sec                 PT Long Term Goals - 04/09/19 1426      PT LONG TERM GOAL #1   Title  Pt will be I with HEP for posture, flexibility and strength    Time  6    Period  Weeks    Status  On-going    Target Date  05/07/19      PT LONG TERM GOAL #2   Title  Pt will be able to sit with corrected posture without cues for optimal alignment    Period  Weeks    Status  Partially Met    Target Date  05/07/19      PT LONG TERM  GOAL #3   Title  Pt will be able to improve cervical AROM to be painfree and WFL in all planes.    Time  6    Period  Weeks    Status  On-going    Target Date  05/07/19      PT LONG TERM GOAL #4   Title  Pt will be able to continue with upper body resistance training with good form and knowledge about stability.    Time  6    Period  Weeks    Status  On-going    Target Date  05/07/19      PT LONG TERM GOAL #5   Title  Pt will have no more than minimal pain at the end of the day (3/10, relieved with rest and stretching)    Period  Weeks    Status  On-going    Target Date  05/07/19            Plan - 04/23/19 1442    Clinical Impression Statement  pt noted no pain coming into today. focused sesson on shoulder stability which he did well increasing reps for emphasis on endurnace training. no pain noted at end of session. If he continues to do well next session plan to discuss d/c.    Examination-Activity Limitations  Sleep;Lift;Reach Overhead;Bed Mobility    PT Treatment/Interventions  ADLs/Self Care Home Management;Cryotherapy;Ultrasound;Dry needling;Taping;Therapeutic activities;Electrical Stimulation;Moist Heat;Traction;Therapeutic exercise;Patient/family education;Manual techniques;Passive range of motion;Neuromuscular  re-education;Functional mobility training    PT Home Exercise Plan  chin tuck, scap squeeze, corner stretch, upper trap and levator scap, lower trap wall y's, supine scapular stabilization, dumbbells reverse fly and 1 arm bent over row, I, Y and T's    Consulted and Agree with Plan of Care  Patient       Patient will benefit from skilled therapeutic intervention in order to improve the following deficits and impairments:  Decreased mobility, Hypomobility, Increased muscle spasms, Postural dysfunction, Pain, Impaired UE functional use, Impaired flexibility, Increased fascial restricitons, Decreased strength, Decreased range of motion  Visit Diagnosis: Cervicalgia  Acute pain of left shoulder  Abnormal posture     Problem List Patient Active Problem List   Diagnosis Date Noted  . Cervical spine disease 02/04/2015  . Neck pain on left side 02/04/2015  . Paresthesia of left arm 02/04/2015  . Numbness of fingers 02/04/2015  . Screening for prostate cancer 12/09/2014  . Diabetes mellitus type 2, uncomplicated ([place]) 06/17/2535  . Chest pain 07/16/2013  . Abnormal EKG 07/16/2013  . Hyperglycemia   . HTN (hypertension) 10/10/2012  . HLD (hyperlipidemia) 10/10/2012  . History of colonic polyps 10/10/2012   Starr Lake PT, DPT, LAT, ATC  04/23/19  2:55 PM      [facility] [address] [place], [place], [address] Phone: [phone]   Fax:  [phone]  Name: Ian Leblanc MRN: [id number] Date of Birth: 1961/05/20

## 2019-04-30 ENCOUNTER — Ambulatory Visit: Payer: 59 | Admitting: Physical Therapy

## 2019-04-30 ENCOUNTER — Other Ambulatory Visit: Payer: Self-pay

## 2019-04-30 ENCOUNTER — Encounter: Payer: Self-pay | Admitting: Physical Therapy

## 2019-04-30 DIAGNOSIS — M542 Cervicalgia: Secondary | ICD-10-CM

## 2019-04-30 DIAGNOSIS — R293 Abnormal posture: Secondary | ICD-10-CM

## 2019-04-30 DIAGNOSIS — M25512 Pain in left shoulder: Secondary | ICD-10-CM

## 2019-04-30 NOTE — Therapy (Signed)
[place], [place], [address] Phone: [phone]   Fax:  [phone]  Physical Therapy Treatment / Discharge  Patient Details  Name: Ian Leblanc MRN: [id number] Date of Birth: 01/12/1961 Referring Provider (PT): Delsa Grana , [place]   Encounter Date: 04/30/2019  PT End of Session - 04/30/19 1636    Visit Number  14    Number of Visits  15    Date for PT Re-Evaluation  05/07/19    PT Start Time  6606    PT Stop Time  1704    PT Time Calculation (min)  30 min    Activity Tolerance  Patient tolerated treatment well       Past Medical History:  Diagnosis Date  . Hyperglycemia   . Hyperlipidemia   . Hypertension   . Sessile colonic polyp     Past Surgical History:  Procedure Laterality Date  . BACK SURGERY     cyst removal    There were no vitals filed for this visit.      [facility] PT Assessment - 04/30/19 0001      Assessment   Medical Diagnosis  L trapezius strain     Referring Provider (PT)  Delsa Grana , PA-C      Observation/Other Assessments   Focus on Therapeutic Outcomes (FOTO)   28% limited      AROM   Cervical Flexion  60    Cervical Extension  44    Cervical - Right Side Bend  48    Cervical - Left Side Bend  54    Cervical - Right Rotation  64    Cervical - Left Rotation  69                   [facility] Adult PT Treatment/Exercise - 04/30/19 0001      Shoulder Exercises: Seated   Horizontal ABduction  Strengthening;Both;12 reps;Theraband    Theraband Level (Shoulder Horizontal ABduction)  Level 4 (Blue)      Shoulder Exercises: ROM/Strengthening   UBE (Upper Arm Bike)  L4 x 6 min    changing direction at 3 min and sprinting first 20 sec every     Shoulder Exercises: Stretch   Other Shoulder Stretches  rhomboids stretch 2 x 30 sec    Other Shoulder Stretches  upper trap/ levator scapulae stretching 2 x 30 sec      Neck Exercises: Stretches   Levator Stretch   2 reps;30 seconds    Corner Stretch  2 reps;30 seconds             PT Education - 04/30/19 1711    Education Details  reviewed previous HEp and dicussed how to appropriately progress strengthening/ endurance by increased reps/ sets and resistance.    Person(s) Educated  Patient    Methods  Explanation    Comprehension  Verbalized understanding          PT Long Term Goals - 04/09/19 1426      PT LONG TERM GOAL #1   Title  Pt will be I with HEP for posture, flexibility and strength    Time  6    Period  Weeks    Status  On-going    Target Date  05/07/19      PT LONG TERM GOAL #2   Title  Pt will be able to sit with corrected posture without cues for optimal alignment    Period  Weeks  Status  Partially Met    Target Date  05/07/19      PT LONG TERM GOAL #3   Title  Pt will be able to improve cervical AROM to be painfree and WFL in all planes.    Time  6    Period  Weeks    Status  On-going    Target Date  05/07/19      PT LONG TERM GOAL #4   Title  Pt will be able to continue with upper body resistance training with good form and knowledge about stability.    Time  6    Period  Weeks    Status  On-going    Target Date  05/07/19      PT LONG TERM GOAL #5   Title  Pt will have no more than minimal pain at the end of the day (3/10, relieved with rest and stretching)    Period  Weeks    Status  On-going    Target Date  05/07/19            Plan - 04/30/19 1708    Clinical Impression Statement  Ian Leblanc reports no pain today and has been pain free for a while. he has functional cervical ROM and met all goals today. He is able to maintain and progress current level of function independently and will be discharged from PT today.    PT Next Visit Plan  D/C today    PT Home Exercise Plan  chin tuck, scap squeeze, corner stretch, upper trap and levator scap, lower trap wall y's, supine scapular stabilization, dumbbells reverse fly and 1 arm bent over row, I,  Y and T's       Patient will benefit from skilled therapeutic intervention in order to improve the following deficits and impairments:  Decreased mobility, Hypomobility, Increased muscle spasms, Postural dysfunction, Pain, Impaired UE functional use, Impaired flexibility, Increased fascial restricitons, Decreased strength, Decreased range of motion  Visit Diagnosis: Cervicalgia  Acute pain of left shoulder  Abnormal posture     Problem List Patient Active Problem List   Diagnosis Date Noted  . Cervical spine disease 02/04/2015  . Neck pain on left side 02/04/2015  . Paresthesia of left arm 02/04/2015  . Numbness of fingers 02/04/2015  . Screening for prostate cancer 12/09/2014  . Diabetes mellitus type 2, uncomplicated ([place]) 50/93/2671  . Chest pain 07/16/2013  . Abnormal EKG 07/16/2013  . Hyperglycemia   . HTN (hypertension) 10/10/2012  . HLD (hyperlipidemia) 10/10/2012  . History of colonic polyps 10/10/2012    Starr Lake 04/30/2019, 5:13 PM  [facility] [address] [place], [place], [address] Phone: [phone]   Fax:  [phone]  Name: Ian Leblanc MRN: [id number] Date of Birth: 09-19-1960        PHYSICAL THERAPY DISCHARGE SUMMARY  Visits from Start of Care: 14  Current functional level related to goals / functional outcomes: See goals   Remaining deficits: N/A   Education / Equipment: HEP, Theraband, posture  Plan: Patient agrees to discharge.  Patient goals were met. Patient is being discharged due to meeting the stated rehab goals.  ?????          Camya Haydon PT, DPT, LAT, ATC  04/30/19  5:14 PM

## 2019-05-06 ENCOUNTER — Other Ambulatory Visit: Payer: Self-pay

## 2019-05-06 MED ORDER — METFORMIN HCL 1000 MG PO TABS: 1000.0000 mg | ORAL_TABLET | Freq: Every day | ORAL | 0 refills | Status: DC

## 2019-05-07 ENCOUNTER — Ambulatory Visit: Payer: 59 | Admitting: Physical Therapy

## 2019-05-30 ENCOUNTER — Telehealth: Payer: Self-pay | Admitting: Family Medicine

## 2019-05-30 NOTE — Telephone Encounter (Signed)
Spoke with pt and he said that we are to far and that he will stick with [facility]. He stated that he hate that you left and that he really likes you. I did tell him that you did give him one refill and he thanks you.

## 2019-06-26 ENCOUNTER — Other Ambulatory Visit: Payer: Self-pay | Admitting: Family Medicine

## 2019-07-03 ENCOUNTER — Other Ambulatory Visit: Payer: Self-pay | Admitting: Family Medicine

## 2019-07-03 ENCOUNTER — Encounter: Payer: Self-pay | Admitting: Family Medicine

## 2019-07-03 DIAGNOSIS — E782 Mixed hyperlipidemia: Secondary | ICD-10-CM

## 2019-07-03 DIAGNOSIS — J309 Allergic rhinitis, unspecified: Secondary | ICD-10-CM | POA: Insufficient documentation

## 2019-07-03 DIAGNOSIS — E119 Type 2 diabetes mellitus without complications: Secondary | ICD-10-CM

## 2019-07-03 DIAGNOSIS — Z5181 Encounter for therapeutic drug level monitoring: Secondary | ICD-10-CM

## 2019-07-03 DIAGNOSIS — I10 Essential (primary) hypertension: Secondary | ICD-10-CM

## 2019-07-03 DIAGNOSIS — R319 Hematuria, unspecified: Secondary | ICD-10-CM

## 2019-07-03 NOTE — Progress Notes (Signed)
Name: Ian Leblanc   MRN: [id number]    DOB: 30-Jun-1961   Date:07/04/2019       Progress Note  Chief Complaint  Patient presents with  . Follow-up  . Hypertension  . Hyperlipidemia  . Diabetes     Subjective:   Ian Leblanc is a 58 y.o. male, presents to clinic for routine follow up on the conditions listed above.  Hypertension:  Pt diagnosed with HTN 3-5 ago Currently managed on benazepril 20 mg daily Pt reports excellent med compliance and denies any SE.  No lightheadedness, hypotension, syncope. Blood pressure today is well controlled. BP Readings from Last 3 Encounters:  07/04/19 122/68  01/31/19 120/80  01/28/19 120/62   Pt denies CP, SOB, exertional sx, LE edema, palpitation, Ha's, visual disturbances Dietary efforts for BP?  Healthy diet low salt    Hyperlipidemia: Current Medication Regimen:  atorvastin 80 mg Last Lipids: Lab Results  Component Value Date   CHOL 123 09/30/2018   HDL 45 09/30/2018   LDLCALC 64 09/30/2018   TRIG 64 09/30/2018   CHOLHDL 2.7 09/30/2018   - Current Diet:  Healthy, balanced - Denies: Chest pain, shortness of breath, myalgias. - Documented aortic atherosclerosis? No - Risk factors for atherosclerosis: diabetes mellitus, hypercholesterolemia and hypertension  Diabetes Mellitus Type II: DM dx 2 years ago Currently managing with metformin 1000 mg twice daily Pt notes excellent med compliance - but he wants to wean off meds Pt has no SE from meds.  No hypoglycemic episodes Denies: Polyuria, polydipsia, polyphagia, vision changes, or neuropathy  Recent pertinent labs: Lab Results  Component Value Date   HGBA1C 5.3 09/30/2018   HGBA1C 5.5 06/26/2018   HGBA1C 5.4 03/28/2018      Component Value Date/Time   NA 139 09/30/2018 0810   K 4.6 09/30/2018 0810   CL 104 09/30/2018 0810   CO2 25 09/30/2018 0810   GLUCOSE 88 09/30/2018 0810   BUN 15 09/30/2018 0810   CREATININE 0.72 09/30/2018 0810   CALCIUM 9.5 09/30/2018  0810   PROT 6.6 09/30/2018 0810   ALBUMIN 4.6 07/05/2016 0843   AST 27 09/30/2018 0810   ALT 30 09/30/2018 0810   ALKPHOS 68 07/05/2016 0843   BILITOT 0.5 09/30/2018 0810   GFRNONAA 102 09/30/2018 0810   GFRAA 118 09/30/2018 0810    Current diet: in general, a "healthy" diet   Current exercise: running/ jogging a lot, 2.5 miles today, doing his first 5K in 2 weeks  Doing foot exam today, he states eye exam done about 6 months ago, he doesn't remember the name of where but he will let us know ACEI/ARB: Yes Statin: Yes  AR: Has constant congestion and nasal sx, still using OTC flonase and zyrtec equivalent - sx worse in the morning and worse with fall weather changes and falling leaves.  Cervicalgia: Has significantly improved with physical therapy and now doing home exercises.  No longer has severe neck pain, decreased range of motion or numbness tingling in his arms  Patient does have a desire to decrease doses on medications or discontinue any medications that he is able to.  With significant lifestyle changes over the past 2 years he would like to work on this.  He is running most days of the week, has lost a lot of weight, is change his diet.  Patient Active Problem List   Diagnosis Date Noted  . Controlled type 2 diabetes mellitus with hyperglycemia, without long-term current use of insulin ([place]) 07/04/2019  .  Allergic rhinitis 07/03/2019  . Cervicalgia 02/04/2015  . Diabetes mellitus type 2, uncomplicated ([place]) [id number]  . HTN (hypertension) 10/10/2012  . HLD (hyperlipidemia) 10/10/2012  . History of colonic polyps 10/10/2012    Past Surgical History:  Procedure Laterality Date  . BACK SURGERY     cyst removal    History reviewed. No pertinent family history.  Social History   Socioeconomic History  . Marital status: Married    Spouse name: Not on file  . Number of children: Not on file  . Years of education: Not on file  . Highest education level: Not on  file  Occupational History  . Not on file  Social Needs  . Financial resource strain: Not on file  . Food insecurity    Worry: Not on file    Inability: Not on file  . Transportation needs    Medical: Not on file    Non-medical: Not on file  Tobacco Use  . Smoking status: Former Smoker    Quit date: 07/17/2003    Years since quitting: 15.9  . Smokeless tobacco: Never Used  Substance and Sexual Activity  . Alcohol use: No  . Drug use: No  . Sexual activity: Not on file  Lifestyle  . Physical activity    Days per week: Not on file    Minutes per session: Not on file  . Stress: Not on file  Relationships  . Social Herbalist on phone: Not on file    Gets together: Not on file    Attends religious service: Not on file    Active member of club or organization: Not on file    Attends meetings of clubs or organizations: Not on file    Relationship status: Not on file  . Intimate partner violence    Fear of current or ex partner: Not on file    Emotionally abused: Not on file    Physically abused: Not on file    Forced sexual activity: Not on file  Other Topics Concern  . Not on file  Social History Narrative  . Not on file     Current Outpatient Medications:  .  aspirin 81 MG tablet, Take 81 mg by mouth daily., Disp: , Rfl:  .  atorvastatin (LIPITOR) 80 MG tablet, Take 1 tablet (80 mg total) by mouth at bedtime. No further refills, needs OV/labs, Disp: 901 tablet, Rfl: 1 .  Multiple Vitamins-Minerals (MULTIVITAMIN PO), Take 1 tablet by mouth daily. Over 50 mvi qd, Disp: , Rfl:  .  Omega-3 Fatty Acids (OMEGA-3 FISH OIL PO), Take 600 mg by mouth daily. , Disp: , Rfl:  .  benazepril (LOTENSIN) 10 MG tablet, Take 1 tablet (10 mg total) by mouth daily., Disp: 90 tablet, Rfl: 3 .  cetirizine (ZYRTEC) 10 MG tablet, Take 1 tablet (10 mg total) by mouth daily. (Patient not taking: Reported on 07/04/2019), Disp: 30 tablet, Rfl: 11 .  fluticasone (FLONASE) 50 MCG/ACT nasal  spray, Place 2 sprays into both nostrils daily., Disp: 16 g, Rfl: 6 .  levocetirizine (XYZAL) 5 MG tablet, Take 1 tablet (5 mg total) by mouth every evening., Disp: 30 tablet, Rfl: 2 .  metFORMIN (GLUCOPHAGE) 500 MG tablet, Take 1 tablet (500 mg total) by mouth 2 (two) times daily with a meal., Disp: 180 tablet, Rfl: 3 .  naproxen (NAPROSYN) 500 MG tablet, Take 1 tablet (500 mg total) by mouth 2 (two) times daily with a meal. (Patient not taking:  Reported on 07/04/2019), Disp: 30 tablet, Rfl: 0  Allergies  Allergen Reactions  . Crestor [Rosuvastatin] Other (See Comments)    Severe myalgias    I personally reviewed active problem list, medication list, allergies, family history, social history, health maintenance, notes from last encounter, lab results, imaging with the patient/caregiver today.  Review of Systems  Constitutional: Negative.  Negative for activity change, appetite change, fatigue and unexpected weight change.  HENT: Negative.   Eyes: Negative.   Respiratory: Negative.  Negative for shortness of breath.   Cardiovascular: Negative.  Negative for chest pain, palpitations and leg swelling.  Gastrointestinal: Negative.  Negative for abdominal pain and blood in stool.  Endocrine: Negative.   Genitourinary: Negative.  Negative for decreased urine volume, difficulty urinating, testicular pain and urgency.  Skin: Negative.  Negative for color change and pallor.  Allergic/Immunologic: Negative.   Neurological: Negative.  Negative for syncope, weakness, light-headedness and numbness.  Psychiatric/Behavioral: Negative.  Negative for confusion, dysphoric mood, self-injury and suicidal ideas. The patient is not nervous/anxious.   All other systems reviewed and are negative.    Objective:    Vitals:   07/04/19 0900  BP: 122/68  Pulse: 88  Resp: 14  Temp: 97.9 F (36.6 C)  SpO2: 97%  Weight: 207 lb 9.6 oz (94.2 kg)  Height: 5\' 10"  (1.778 m)    Body mass index is 29.79 kg/m.   Physical Exam Vitals signs and nursing note reviewed.  Constitutional:      General: He is not in acute distress.    Appearance: Normal appearance. He is well-developed. He is not ill-appearing, toxic-appearing or diaphoretic.     Interventions: Face mask in place.  HENT:     Head: Normocephalic and atraumatic.     Jaw: No trismus.     Right Ear: External ear normal.     Left Ear: External ear normal.  Eyes:     General: Lids are normal. No scleral icterus.    Conjunctiva/sclera: Conjunctivae normal.     Pupils: Pupils are equal, round, and reactive to light.  Neck:     Musculoskeletal: Normal range of motion and neck supple. No neck rigidity.     Trachea: Trachea and phonation normal. No tracheal deviation.  Cardiovascular:     Rate and Rhythm: Normal rate and regular rhythm.     Pulses: Normal pulses.          Radial pulses are 2+ on the right side and 2+ on the left side.       Posterior tibial pulses are 2+ on the right side and 2+ on the left side.     Heart sounds: Normal heart sounds. No murmur. No friction rub. No gallop.   Pulmonary:     Effort: Pulmonary effort is normal. No respiratory distress.     Breath sounds: Normal breath sounds. No stridor. No wheezing, rhonchi or rales.  Abdominal:     General: Bowel sounds are normal. There is no distension.     Palpations: Abdomen is soft.     Tenderness: There is no abdominal tenderness. There is no guarding or rebound.  Musculoskeletal: Normal range of motion.        General: No swelling.     Right lower leg: No edema.     Left lower leg: No edema.  Skin:    General: Skin is warm and dry.     Capillary Refill: Capillary refill takes less than 2 seconds.     Coloration: Skin is  not jaundiced.     Findings: No rash.     Nails: There is no clubbing.   Neurological:     Mental Status: He is alert.     Cranial Nerves: No dysarthria or facial asymmetry.     Motor: No tremor or abnormal muscle tone.     Gait: Gait  normal.  Psychiatric:        Mood and Affect: Mood normal.        Speech: Speech normal.        Behavior: Behavior normal. Behavior is cooperative.      No results found for this or any previous visit (from the past 2160 hour(s)).  Diabetic Foot Exam: Diabetic Foot Exam - Simple   Simple Foot Form Diabetic Foot exam was performed with the following findings: Yes 07/04/2019  9:00 AM  Visual Inspection Sensation Testing Pulse Check Comments      PHQ2/9: Depression screen [facility] - [facility] 2/9 07/04/2019 06/26/2018 05/07/2018 09/12/2017 05/23/2017  Decreased Interest 0 0 0 0 0  Down, Depressed, Hopeless 0 0 0 0 0  PHQ - 2 Score 0 0 0 0 0  Altered sleeping 0 - - - -  Tired, decreased energy 0 - - - -  Change in appetite 0 - - - -  Feeling bad or failure about yourself  0 - - - -  Trouble concentrating 0 - - - -  Moving slowly or fidgety/restless 0 - - - -  Suicidal thoughts 0 - - - -  PHQ-9 Score 0 - - - -    phq 9 is negative Reviewed today, negative, no action needed  Fall Risk: Fall Risk  07/04/2019 06/26/2018 05/07/2018 05/23/2017  Falls in the past year? 0 0 No No  Number falls in past yr: 0 - - -  Injury with Fall? 0 - - -  Follow up - Falls evaluation completed - -      Functional Status Survey: Is the patient deaf or have difficulty hearing?: Yes Does the patient have difficulty seeing, even when wearing glasses/contacts?: No Does the patient have difficulty concentrating, remembering, or making decisions?: No Does the patient have difficulty walking or climbing stairs?: No Does the patient have difficulty dressing or bathing?: No Does the patient have difficulty doing errands alone such as visiting a doctor's office or shopping?: No    Assessment & Plan:       ICD-10-CM   1. Hypertension, unspecified type  I10 benazepril (LOTENSIN) 10 MG tablet   Well controlled, recheck meds, pt wanting to decrease meds or doses - agreed to decrease from 20 mg to 10 mg and recheck in  few months  2. Mixed hyperlipidemia  E78.2 atorvastatin (LIPITOR) 80 MG tablet   has been well controlled, still tolerating meds w/o SE or concerns  3. Controlled type 2 diabetes mellitus with hyperglycemia, without long-term current use of insulin ([facility]) Chronic E11.65 metFORMIN (GLUCOPHAGE) 500 MG tablet   rechecking labs, foot exam updated, eye exam done - need to verify, decrease meds to 500 mg BID and recheck in a few months.  4. Allergic rhinitis, unspecified seasonality, unspecified trigger  J30.9 levocetirizine (XYZAL) 5 MG tablet   worse with fall weather, d/c zyrtec/claritin and try xyzal + flonase  5. Cervicalgia  M54.2    improved with PT, now doing home exercises  6. Hematuria, unspecified type  R31.9    asx, recheck for blood  7. Encounter for medication monitoring  Z51.81  Return in about 3 months (around 10/04/2019) for 3-4 months Routine followup recheck BP and A1C.   Delsa Grana, PA-C 07/04/19 9:48 AM

## 2019-07-04 ENCOUNTER — Other Ambulatory Visit: Payer: Self-pay

## 2019-07-04 ENCOUNTER — Ambulatory Visit: Payer: BC Managed Care – PPO | Admitting: Family Medicine

## 2019-07-04 ENCOUNTER — Encounter: Payer: Self-pay | Admitting: Family Medicine

## 2019-07-04 VITALS — BP 122/68 | HR 88 | Temp 97.9°F | Resp 14 | Ht 70.0 in | Wt 207.6 lb

## 2019-07-04 DIAGNOSIS — E782 Mixed hyperlipidemia: Secondary | ICD-10-CM

## 2019-07-04 DIAGNOSIS — M542 Cervicalgia: Secondary | ICD-10-CM

## 2019-07-04 DIAGNOSIS — I10 Essential (primary) hypertension: Secondary | ICD-10-CM

## 2019-07-04 DIAGNOSIS — Z5181 Encounter for therapeutic drug level monitoring: Secondary | ICD-10-CM

## 2019-07-04 DIAGNOSIS — R319 Hematuria, unspecified: Secondary | ICD-10-CM | POA: Diagnosis not present

## 2019-07-04 DIAGNOSIS — E119 Type 2 diabetes mellitus without complications: Secondary | ICD-10-CM | POA: Diagnosis not present

## 2019-07-04 DIAGNOSIS — J309 Allergic rhinitis, unspecified: Secondary | ICD-10-CM

## 2019-07-04 DIAGNOSIS — E1165 Type 2 diabetes mellitus with hyperglycemia: Secondary | ICD-10-CM | POA: Insufficient documentation

## 2019-07-04 MED ORDER — LEVOCETIRIZINE DIHYDROCHLORIDE 5 MG PO TABS: 5.0000 mg | ORAL_TABLET | Freq: Every evening | ORAL | 2 refills | Status: DC

## 2019-07-04 MED ORDER — BENAZEPRIL HCL 10 MG PO TABS: 10.0000 mg | ORAL_TABLET | Freq: Every day | ORAL | 3 refills | Status: DC

## 2019-07-04 MED ORDER — ATORVASTATIN CALCIUM 80 MG PO TABS: 80.0000 mg | ORAL_TABLET | Freq: Every day | ORAL | 1 refills | Status: DC

## 2019-07-04 MED ORDER — METFORMIN HCL 500 MG PO TABS: 500.0000 mg | ORAL_TABLET | Freq: Two times a day (BID) | ORAL | 3 refills | Status: DC

## 2019-07-05 LAB — CBC WITH DIFFERENTIAL/PLATELET
Absolute Monocytes: 522 cells/uL (ref 200–950)
Basophils Absolute: 41 cells/uL (ref 0–200)
Basophils Relative: 0.7 %
Eosinophils Absolute: 58 cells/uL (ref 15–500)
Eosinophils Relative: 1 %
HCT: 42.6 % (ref 38.5–50.0)
Hemoglobin: 14.4 g/dL (ref 13.2–17.1)
Lymphs Abs: 1601 cells/uL (ref 850–3900)
MCH: 28.8 pg (ref 27.0–33.0)
MCHC: 33.8 g/dL (ref 32.0–36.0)
MCV: 85.2 fL (ref 80.0–100.0)
MPV: 11.7 fL (ref 7.5–12.5)
Monocytes Relative: 9 %
Neutro Abs: 3579 cells/uL (ref 1500–7800)
Neutrophils Relative %: 61.7 %
Platelets: 158 10*3/uL (ref 140–400)
RBC: 5 10*6/uL (ref 4.20–5.80)
RDW: 12.7 % (ref 11.0–15.0)
Total Lymphocyte: 27.6 %
WBC: 5.8 10*3/uL (ref 3.8–10.8)

## 2019-07-05 LAB — COMPLETE METABOLIC PANEL WITH GFR
AG Ratio: 2.3 (calc) (ref 1.0–2.5)
ALT: 28 U/L (ref 9–46)
AST: 21 U/L (ref 10–35)
Albumin: 4.5 g/dL (ref 3.6–5.1)
Alkaline phosphatase (APISO): 66 U/L (ref 35–144)
BUN: 16 mg/dL (ref 7–25)
CO2: 27 mmol/L (ref 20–32)
Calcium: 9.6 mg/dL (ref 8.6–10.3)
Chloride: 105 mmol/L (ref 98–110)
Creat: 0.74 mg/dL (ref 0.70–1.25)
GFR, Est African American: 116 mL/min/{1.73_m2} (ref >=60)
GFR, Est Non African American: 100 mL/min/{1.73_m2} (ref >=60)
Globulin: 2 g/dL (calc) (ref 1.9–3.7)
Glucose, Bld: 92 mg/dL (ref 65–99)
Potassium: 4.4 mmol/L (ref 3.5–5.3)
Sodium: 140 mmol/L (ref 135–146)
Total Bilirubin: 0.6 mg/dL (ref 0.2–1.2)
Total Protein: 6.5 g/dL (ref 6.1–8.1)

## 2019-07-05 LAB — URINALYSIS, ROUTINE W REFLEX MICROSCOPIC
Bacteria, UA: NONE SEEN /HPF
Bilirubin Urine: NEGATIVE
Glucose, UA: NEGATIVE
Hgb urine dipstick: NEGATIVE
Hyaline Cast: NONE SEEN /LPF
Ketones, ur: NEGATIVE
Nitrite: NEGATIVE
Protein, ur: NEGATIVE
Specific Gravity, Urine: 1.022 (ref 1.001–1.03)
Squamous Epithelial / HPF: NONE SEEN /HPF (ref <=5)
WBC, UA: NONE SEEN /HPF (ref 0–5)
pH: 7 (ref 5.0–8.0)

## 2019-07-05 LAB — HEMOGLOBIN A1C
Hgb A1c MFr Bld: 5.4 % of total Hgb (ref <5.7)
Mean Plasma Glucose: 108 (calc)
eAG (mmol/L): 6 (calc)

## 2019-07-05 LAB — LIPID PANEL
Cholesterol: 138 mg/dL (ref <200)
HDL: 45 mg/dL (ref >=40)
LDL Cholesterol (Calc): 77 mg/dL (calc)
Non-HDL Cholesterol (Calc): 93 mg/dL (calc) (ref <130)
Total CHOL/HDL Ratio: 3.1 (calc) (ref <5.0)
Triglycerides: 82 mg/dL (ref <150)

## 2019-10-02 ENCOUNTER — Other Ambulatory Visit: Payer: Self-pay | Admitting: Family Medicine

## 2019-10-02 DIAGNOSIS — J309 Allergic rhinitis, unspecified: Secondary | ICD-10-CM

## 2019-10-10 ENCOUNTER — Ambulatory Visit: Payer: BC Managed Care – PPO | Admitting: Family Medicine

## 2019-10-16 ENCOUNTER — Other Ambulatory Visit: Payer: Self-pay | Admitting: Family Medicine

## 2019-10-16 DIAGNOSIS — J309 Allergic rhinitis, unspecified: Secondary | ICD-10-CM

## 2019-10-22 DIAGNOSIS — H2513 Age-related nuclear cataract, bilateral: Secondary | ICD-10-CM | POA: Diagnosis not present

## 2019-10-22 DIAGNOSIS — B0239 Other herpes zoster eye disease: Secondary | ICD-10-CM | POA: Diagnosis not present

## 2019-10-22 DIAGNOSIS — E119 Type 2 diabetes mellitus without complications: Secondary | ICD-10-CM | POA: Diagnosis not present

## 2019-10-22 DIAGNOSIS — H35033 Hypertensive retinopathy, bilateral: Secondary | ICD-10-CM | POA: Diagnosis not present

## 2019-10-26 ENCOUNTER — Other Ambulatory Visit: Payer: Self-pay | Admitting: Family Medicine

## 2019-10-26 DIAGNOSIS — J309 Allergic rhinitis, unspecified: Secondary | ICD-10-CM

## 2019-10-31 ENCOUNTER — Ambulatory Visit: Payer: Self-pay | Attending: Internal Medicine

## 2019-10-31 DIAGNOSIS — Z23 Encounter for immunization: Secondary | ICD-10-CM

## 2019-10-31 NOTE — Progress Notes (Signed)
   Covid-19 Vaccination Clinic  Name:  Ian Leblanc    MRN: [id number] DOB: 1961-04-13  10/31/2019  Mr. [name] was observed post Covid-19 immunization for 15 minutes without incident. He was provided with Vaccine Information Sheet and instruction to access the V-Safe system.   Mr. [name] was instructed to call 911 with any severe reactions post vaccine: Marland Kitchen Difficulty breathing  . Swelling of face and throat  . A fast heartbeat  . A bad rash all over body  . Dizziness and weakness   Immunizations Administered    Name Date Dose VIS Date Route   Pfizer COVID-19 Vaccine 10/31/2019  9:02 AM 0.3 mL 08/01/2019 Intramuscular   Manufacturer: [place]   [place]ot: [id number]   [place]: [id number]

## 2019-11-05 ENCOUNTER — Other Ambulatory Visit: Payer: Self-pay

## 2019-11-05 ENCOUNTER — Ambulatory Visit: Payer: BC Managed Care – PPO | Admitting: Family Medicine

## 2019-11-05 ENCOUNTER — Encounter: Payer: Self-pay | Admitting: Family Medicine

## 2019-11-05 VITALS — BP 138/72 | HR 66 | Temp 97.9°F | Resp 14 | Ht 70.0 in | Wt 211.6 lb

## 2019-11-05 DIAGNOSIS — Z5181 Encounter for therapeutic drug level monitoring: Secondary | ICD-10-CM

## 2019-11-05 DIAGNOSIS — E119 Type 2 diabetes mellitus without complications: Secondary | ICD-10-CM

## 2019-11-05 DIAGNOSIS — E782 Mixed hyperlipidemia: Secondary | ICD-10-CM

## 2019-11-05 DIAGNOSIS — I10 Essential (primary) hypertension: Secondary | ICD-10-CM | POA: Diagnosis not present

## 2019-11-05 DIAGNOSIS — E1165 Type 2 diabetes mellitus with hyperglycemia: Secondary | ICD-10-CM

## 2019-11-05 DIAGNOSIS — I83811 Varicose veins of right lower extremities with pain: Secondary | ICD-10-CM

## 2019-11-05 DIAGNOSIS — J309 Allergic rhinitis, unspecified: Secondary | ICD-10-CM

## 2019-11-05 MED ORDER — LEVOCETIRIZINE DIHYDROCHLORIDE 5 MG PO TABS: 5.0000 mg | ORAL_TABLET | Freq: Every evening | ORAL | 3 refills | Status: DC

## 2019-11-05 MED ORDER — FLUTICASONE PROPIONATE 50 MCG/ACT NA SUSP: 2.0000 | Freq: Every day | NASAL | 6 refills | Status: DC

## 2019-11-05 MED ORDER — ATORVASTATIN CALCIUM 80 MG PO TABS: 80.0000 mg | ORAL_TABLET | Freq: Every day | ORAL | 3 refills | Status: DC

## 2019-11-05 NOTE — Progress Notes (Signed)
Name: Ian Leblanc   MRN: [id number]    DOB: 08-06-1961   Date:11/05/2019       Progress Note  Chief Complaint  Patient presents with  . Follow-up  . Diabetes  . Hypertension  . Hyperlipidemia  . Varicose Veins     Subjective:   Ian Leblanc is a 59 y.o. male, presents to clinic for routine follow up on the conditions listed above.  Hypertension:  Pt diagnosed with HTN 3-5 ago Currently managed on benazepril 10 mg Pt reports good med compliance and denies any SE.  No lightheadedness, hypotension, syncope. Blood pressure today is slightly higher than normal for him,  Uncontrolled, history of being well controlled in the past We had tried to decrease his benazepril dose from 20 mg to 10 mg due to significant life changes, he is exercising and running many times a week, he is lost a lot of weight BP Readings from Last 3 Encounters:  11/05/19 138/72  07/04/19 122/68  01/31/19 120/80  Pt denies CP, SOB, exertional sx, LE edema, palpitation, Ha's, visual disturbances  Hyperlipidemia: Current Medication Regimen:  atorvastin 80 mg -continued excellent compliance, no concerns or side effects Last Lipids: Lab Results  Component Value Date   CHOL 138 07/04/2019   HDL 45 07/04/2019   LDLCALC 77 07/04/2019   TRIG 82 07/04/2019   CHOLHDL 3.1 07/04/2019   - Current Diet: Continued healthy diet and exercise - Denies: Chest pain, shortness of breath, myalgias. - Documented aortic atherosclerosis? No - Risk factors for atherosclerosis: diabetes mellitus, hypercholesterolemia and hypertension  Diabetes Mellitus Type II: DM dx 2-3 years ago Currently managing with metformin 1000 mg once daily recently decreased Pt notes excellent med compliance Pt has no SE from meds.  No hypoglycemic episodes Denies: Polyuria, polydipsia, polyphagia, vision changes, or neuropathy Patient is not monitoring CBGs, history of well-controlled diabetes Recent pertinent labs: Lab Results    Component Value Date   HGBA1C 5.4 07/04/2019   HGBA1C 5.3 09/30/2018   HGBA1C 5.5 06/26/2018   He is up-to-date on DM foot exam and eye exam ACEI/ARB: Yes  Statin: Yes  Current diet: in general, a "healthy" diet   Current exercise: running/ jogging a lot, 2.5 miles 4 days a week  AR: Proving symptoms with Flonase and Xyzal he reports improved congestion, less obstruction and snoring when he lays down at night, he believes he is due for refills  He does complain about right lower leg varicose veins that have been there for many years, often cause him some soreness aching and irritation.  They have not changed much with his lifestyle changes and weight loss.  He has tried compression stockings before kneehighs but they tend to further irritate the discomfort.  He does not have any sores or wounds in the area has never had any redness or edema overlying them he has no other areas of varicose veins and he denies any history of lower extremity edema or pitting edema   Patient Active Problem List   Diagnosis Date Noted  . Controlled type 2 diabetes mellitus with hyperglycemia, without long-term current use of insulin ([place]) 07/04/2019  . Allergic rhinitis 07/03/2019  . Cervicalgia 02/04/2015  . Diabetes mellitus type 2, uncomplicated ([place]) [id number]  . HTN (hypertension) 10/10/2012  . HLD (hyperlipidemia) 10/10/2012  . History of colonic polyps 10/10/2012    Past Surgical History:  Procedure Laterality Date  . BACK SURGERY     cyst removal    History reviewed. No pertinent family  history.  Social History   Tobacco Use  . Smoking status: Former Smoker    Quit date: 07/17/2003    Years since quitting: 16.3  . Smokeless tobacco: Never Used  Substance Use Topics  . Alcohol use: No  . Drug use: No      Current Outpatient Medications:  .  aspirin 81 MG tablet, Take 81 mg by mouth daily., Disp: , Rfl:  .  benazepril (LOTENSIN) 10 MG tablet, Take 1 tablet (10 mg total) by  mouth daily., Disp: 90 tablet, Rfl: 3 .  cetirizine (ZYRTEC) 10 MG tablet, Take 1 tablet (10 mg total) by mouth daily., Disp: 30 tablet, Rfl: 11 .  fluticasone (FLONASE) 50 MCG/ACT nasal spray, Place 2 sprays into both nostrils daily., Disp: 16 g, Rfl: 6 .  metFORMIN (GLUCOPHAGE) 500 MG tablet, Take 1 tablet (500 mg total) by mouth 2 (two) times daily with a meal. (Patient taking differently: Take 500 mg by mouth daily with breakfast. ), Disp: 180 tablet, Rfl: 3 .  Multiple Vitamins-Minerals (MULTIVITAMIN PO), Take 1 tablet by mouth daily. Over 50 mvi qd, Disp: , Rfl:  .  Omega-3 Fatty Acids (OMEGA-3 FISH OIL PO), Take 600 mg by mouth daily. , Disp: , Rfl:  .  atorvastatin (LIPITOR) 80 MG tablet, Take 1 tablet (80 mg total) by mouth at bedtime. No further refills, needs OV/labs, Disp: 901 tablet, Rfl: 1 .  levocetirizine (XYZAL) 5 MG tablet, TAKE 1 TABLET BY MOUTH ONCE DAILY IN THE EVENING (Patient not taking: Reported on 11/05/2019), Disp: 30 tablet, Rfl: 0 .  naproxen (NAPROSYN) 500 MG tablet, Take 1 tablet (500 mg total) by mouth 2 (two) times daily with a meal. (Patient not taking: Reported on 07/04/2019), Disp: 30 tablet, Rfl: 0  Allergies  Allergen Reactions  . Crestor [Rosuvastatin] Other (See Comments)    Severe myalgias    Chart Review Today: I personally reviewed active problem list, medication list, allergies, family history, social history, health maintenance, notes from last encounter, lab results, imaging with the patient/caregiver today.   Review of Systems  10 Systems reviewed and are negative for acute change except as noted in the HPI.  Objective:    Vitals:   11/05/19 1137  BP: 138/72  Pulse: 66  Resp: 14  Temp: 97.9 F (36.6 C)  SpO2: 95%  Weight: 211 lb 9.6 oz (96 kg)  Height: 5\' 10"  (1.778 m)    Body mass index is 30.36 kg/m.  Physical Exam Vitals and nursing note reviewed.  Constitutional:      General: He is not in acute distress.    Appearance:  Normal appearance. He is well-developed. He is not ill-appearing, toxic-appearing or diaphoretic.     Interventions: Face mask in place.  HENT:     Head: Normocephalic and atraumatic.     Jaw: No trismus.     Right Ear: External ear normal.     Left Ear: External ear normal.     Nose: No mucosal edema, congestion or rhinorrhea.     Right Turbinates: Not enlarged or swollen.     Left Turbinates: Not enlarged or swollen.     Comments: Mild bilateral nasal mucosa erythema without any significant edema or nasal discharge Eyes:     General: Lids are normal. No scleral icterus.    Conjunctiva/sclera: Conjunctivae normal.     Pupils: Pupils are equal, round, and reactive to light.  Neck:     Trachea: Trachea and phonation normal. No tracheal deviation.  Cardiovascular:     Rate and Rhythm: Normal rate and regular rhythm.     Pulses: Normal pulses.          Radial pulses are 2+ on the right side and 2+ on the left side.       Posterior tibial pulses are 2+ on the right side and 2+ on the left side.     Heart sounds: Normal heart sounds. No murmur. No friction rub. No gallop.   Pulmonary:     Effort: Pulmonary effort is normal. No respiratory distress.     Breath sounds: Normal breath sounds. No stridor. No wheezing, rhonchi or rales.  Abdominal:     General: Bowel sounds are normal. There is no distension.     Palpations: Abdomen is soft.     Tenderness: There is no abdominal tenderness. There is no guarding or rebound.  Musculoskeletal:        General: Normal range of motion.     Cervical back: Normal range of motion and neck supple.     Right lower leg: No edema.     Left lower leg: No edema.  Skin:    General: Skin is warm and dry.     Capillary Refill: Capillary refill takes less than 2 seconds.     Coloration: Skin is not jaundiced.     Findings: No rash.     Nails: There is no clubbing.     Comments: Right LE medial aspect from just below knee about halfway down medial calf  varicosities without any edema or erythema, no wound sores skin intact  Neurological:     Mental Status: He is alert.     Cranial Nerves: No dysarthria or facial asymmetry.     Motor: No tremor or abnormal muscle tone.     Gait: Gait normal.  Psychiatric:        Mood and Affect: Mood normal.        Speech: Speech normal.        Behavior: Behavior normal. Behavior is cooperative.        PHQ2/9: Depression screen [facility] 2/9 11/05/2019 07/04/2019 06/26/2018 05/07/2018 09/12/2017  Decreased Interest 0 0 0 0 0  Down, Depressed, Hopeless 0 0 0 0 0  PHQ - 2 Score 0 0 0 0 0  Altered sleeping 0 0 - - -  Tired, decreased energy 0 0 - - -  Change in appetite 0 0 - - -  Feeling bad or failure about yourself  0 0 - - -  Trouble concentrating 0 0 - - -  Moving slowly or fidgety/restless 0 0 - - -  Suicidal thoughts 0 0 - - -  PHQ-9 Score 0 0 - - -  Difficult doing work/chores Not difficult at all - - - -    phq 9 is neg, reviewed today  Fall Risk: Fall Risk  11/05/2019 07/04/2019 06/26/2018 05/07/2018 05/23/2017  Falls in the past year? 0 0 0 No No  Number falls in past yr: 0 0 - - -  Injury with Fall? 0 0 - - -  Follow up - - Falls evaluation completed - -    Functional Status Survey: Is the patient deaf or have difficulty hearing?: Yes Does the patient have difficulty seeing, even when wearing glasses/contacts?: No Does the patient have difficulty concentrating, remembering, or making decisions?: No Does the patient have difficulty walking or climbing stairs?: No Does the patient have difficulty dressing or bathing?: No Does the patient  have difficulty doing errands alone such as visiting a doctor's office or shopping?: No   Assessment & Plan:   1. Controlled type 2 diabetes mellitus with hyperglycemia, without long-term current use of insulin ([facility]CC) Well-controlled recently decreased dose of metformin due to A1c of 5.4 with her last visit about 4 months ago Recheck labs with Metformin  dose decrease - COMPLETE METABOLIC PANEL WITH GFR - Hemoglobin A1c  2. Type 2 diabetes mellitus without complication, without long-term current use of insulin ([facility]) See above - COMPLETE METABOLIC PANEL WITH GFR - Hemoglobin A1c  3. Hypertension, unspecified type Blood pressure did increase slightly with decreasing the benazepril dose, today blood pressure is 138/72 very close to goal, patient will continue to monitor at home and with his follow-up visits.  He is very motivated to get off all medications completely, if he remains above 130/80 we will have him resume the 20 mg dose - COMPLETE METABOLIC PANEL WITH GFR  4. Mixed hyperlipidemia Compliant with meds, no SE, no myalgias, fatigue or jaundice encouraged continued diet and exercise efforts - atorvastatin (LIPITOR) 80 MG tablet; Take 1 tablet (80 mg total) by mouth at bedtime. No further refills, needs OV/labs  Dispense: 90 tablet; Refill: 3 - COMPLETE METABOLIC PANEL WITH GFR - Lipid panel  5. Allergic rhinitis, unspecified seasonality, unspecified trigger Improved sx, encouraged continued use of medications with spring approaching - levocetirizine (XYZAL) 5 MG tablet; Take 1 tablet (5 mg total) by mouth every evening.  Dispense: 90 tablet; Refill: 3 - fluticasone (FLONASE) 50 MCG/ACT nasal spray; Place 2 sprays into both nostrils daily.  Dispense: 16 g; Refill: 6  7. Encounter for medication monitoring - CBC with Differential/Platelet - COMPLETE METABOLIC PANEL WITH GFR - Lipid panel - Hemoglobin A1c  8. Varicose veins of right lower extremity with pain New complaint of varicosities right medial lower leg did discuss conservative measures, which patient has done in the past, encouraged him to try thigh-high compression stockings and he did request a specialist referral for further evaluation - Ambulatory referral to Vascular Surgery   Return in about 6 months (around 05/07/2020) for Routine follow-up, Annual Physical split  40 min.   Delsa Grana, PA-C 11/05/19 11:46 AM

## 2019-11-06 ENCOUNTER — Encounter: Payer: Self-pay | Admitting: Family Medicine

## 2019-11-06 LAB — COMPLETE METABOLIC PANEL WITH GFR
AG Ratio: 2.1 (calc) (ref 1.0–2.5)
ALT: 19 U/L (ref 9–46)
AST: 17 U/L (ref 10–35)
Albumin: 4.4 g/dL (ref 3.6–5.1)
Alkaline phosphatase (APISO): 72 U/L (ref 35–144)
BUN: 11 mg/dL (ref 7–25)
CO2: 26 mmol/L (ref 20–32)
Calcium: 9.5 mg/dL (ref 8.6–10.3)
Chloride: 107 mmol/L (ref 98–110)
Creat: 0.75 mg/dL (ref 0.70–1.25)
GFR, Est African American: 116 mL/min/{1.73_m2} (ref >=60)
GFR, Est Non African American: 100 mL/min/{1.73_m2} (ref >=60)
Globulin: 2.1 g/dL (calc) (ref 1.9–3.7)
Glucose, Bld: 94 mg/dL (ref 65–99)
Potassium: 4.2 mmol/L (ref 3.5–5.3)
Sodium: 139 mmol/L (ref 135–146)
Total Bilirubin: 0.6 mg/dL (ref 0.2–1.2)
Total Protein: 6.5 g/dL (ref 6.1–8.1)

## 2019-11-06 LAB — CBC WITH DIFFERENTIAL/PLATELET
Absolute Monocytes: 557 cells/uL (ref 200–950)
Basophils Absolute: 58 cells/uL (ref 0–200)
Basophils Relative: 1 %
Eosinophils Absolute: 70 cells/uL (ref 15–500)
Eosinophils Relative: 1.2 %
HCT: 44.9 % (ref 38.5–50.0)
Hemoglobin: 15.2 g/dL (ref 13.2–17.1)
Lymphs Abs: 1723 cells/uL (ref 850–3900)
MCH: 28.8 pg (ref 27.0–33.0)
MCHC: 33.9 g/dL (ref 32.0–36.0)
MCV: 85.2 fL (ref 80.0–100.0)
MPV: 12 fL (ref 7.5–12.5)
Monocytes Relative: 9.6 %
Neutro Abs: 3393 cells/uL (ref 1500–7800)
Neutrophils Relative %: 58.5 %
Platelets: 164 10*3/uL (ref 140–400)
RBC: 5.27 10*6/uL (ref 4.20–5.80)
RDW: 12.4 % (ref 11.0–15.0)
Total Lymphocyte: 29.7 %
WBC: 5.8 10*3/uL (ref 3.8–10.8)

## 2019-11-06 LAB — LIPID PANEL
Cholesterol: 212 mg/dL — ABNORMAL HIGH (ref <200)
HDL: 36 mg/dL — ABNORMAL LOW (ref >=40)
LDL Cholesterol (Calc): 145 mg/dL (calc) — ABNORMAL HIGH
Non-HDL Cholesterol (Calc): 176 mg/dL (calc) — ABNORMAL HIGH (ref <130)
Total CHOL/HDL Ratio: 5.9 (calc) — ABNORMAL HIGH (ref <5.0)
Triglycerides: 169 mg/dL — ABNORMAL HIGH (ref <150)

## 2019-11-06 LAB — HEMOGLOBIN A1C
Hgb A1c MFr Bld: 5.3 % of total Hgb (ref <5.7)
Mean Plasma Glucose: 105 (calc)
eAG (mmol/L): 5.8 (calc)

## 2019-11-24 ENCOUNTER — Ambulatory Visit: Payer: BC Managed Care – PPO | Attending: Internal Medicine

## 2019-11-24 DIAGNOSIS — Z23 Encounter for immunization: Secondary | ICD-10-CM

## 2019-11-24 NOTE — Progress Notes (Signed)
   Covid-19 Vaccination Clinic  Name:  Ian Leblanc    MRN: [id number] DOB: [birth year]  11/24/2019  [name] was observed post Covid-19 immunization for 15 minutes without incident. He was provided with Vaccine Information Sheet and instruction to access the V-Safe system.   [name] was instructed to call 911 with any severe reactions post vaccine: Marland Kitchen Difficulty breathing  . Swelling of face and throat  . A fast heartbeat  . A bad rash all over body  . Dizziness and weakness   Immunizations Administered    Name Date Dose VIS Date Route   Pfizer COVID-19 Vaccine 11/24/2019 11:28 AM 0.3 mL 08/01/2019 Intramuscular   Manufacturer: [employer]   Lot: [id number]   [place]: [id number]

## 2019-11-28 ENCOUNTER — Other Ambulatory Visit: Payer: Self-pay | Admitting: *Deleted

## 2019-11-28 DIAGNOSIS — I872 Venous insufficiency (chronic) (peripheral): Secondary | ICD-10-CM

## 2019-12-03 ENCOUNTER — Telehealth (HOSPITAL_COMMUNITY): Payer: Self-pay

## 2019-12-03 NOTE — Telephone Encounter (Signed)

## 2019-12-04 ENCOUNTER — Encounter: Payer: Self-pay | Admitting: Vascular Surgery

## 2019-12-04 ENCOUNTER — Ambulatory Visit (HOSPITAL_COMMUNITY)
Admission: RE | Admit: 2019-12-04 | Discharge: 2019-12-04 | Disposition: A | Discharge status: Home / Self Care | Payer: BC Managed Care – PPO | Source: Ambulatory Visit | Attending: Surgery | Admitting: Surgery

## 2019-12-04 ENCOUNTER — Ambulatory Visit: Payer: BC Managed Care – PPO | Admitting: Vascular Surgery

## 2019-12-04 ENCOUNTER — Other Ambulatory Visit: Payer: Self-pay

## 2019-12-04 VITALS — BP 155/86 | HR 52 | Temp 98.0°F | Resp 16 | Ht 70.0 in | Wt 211.4 lb

## 2019-12-04 DIAGNOSIS — I872 Venous insufficiency (chronic) (peripheral): Secondary | ICD-10-CM | POA: Diagnosis not present

## 2019-12-04 DIAGNOSIS — I83811 Varicose veins of right lower extremities with pain: Secondary | ICD-10-CM | POA: Diagnosis not present

## 2019-12-04 NOTE — Progress Notes (Signed)
REASON FOR CONSULT:    Painful varicose veins of the right lower extremity.  The consult is requested by Delsa Grana, PA.  ASSESSMENT & PLAN:   PAINFUL VARICOSE VEINS RIGHT LOWER EXTREMITY: This patient has a cluster of varicose veins in the medial and posterior right leg being fed off an incompetent proximal small saphenous vein.  The veins are under significant pressure and causing significant symptoms.  We have discussed the importance of intermittent leg elevation and the proper positioning for this.  In addition I have recommended thigh-high compression stockings with a gradient of 20 to 30 mmHg and he has been fitted for these today.  We also discussed the importance of avoiding prolonged sitting and standing.  In addition he will continue to exercise.  If his symptoms do not improve with conservative measures I think he would be a candidate for 10-20 stab phlebectomies of the right lower extremity.  I think the incompetent segment of small saphenous vein which feeds these clusters of varicose veins is less than 3 cm in length and therefore he would not be a good candidate for laser ablation of this short segment.  However I think he would benefit from stab phlebectomies if the symptoms persist.  I will plan on seeing him back in 3 months.  He knows to call sooner if he has problems.  Deitra Mayo, MD Office: [phone]   HPI:   Ian Leblanc is a pleasant 59 y.o. male, who presents with painful varicose veins of the right lower extremity.  Patient has had symptoms for many years but over the last year his symptoms have progressed significantly.  He describes aching pain and heaviness in the right leg which is aggravated by exercise standing and sitting.  His symptoms are relieved with elevation.  His knee-high compression stockings irritate the varicosities just below his knee.  Therefore he has not been wearing the stockings much.  He has had no previous venous procedures.  He has no  previous history of DVT.  He is otherwise very healthy.  I do not get any history of claudication, rest pain, or nonhealing ulcers.  Past Medical History:  Diagnosis Date  . Abnormal EKG 07/16/2013  . Cervical spine disease 02/04/2015  . Chest pain 07/16/2013  . Hyperglycemia   . Hyperlipidemia   . Hypertension   . Neck pain on left side 02/04/2015  . Numbness of fingers 02/04/2015  . Paresthesia of left arm 02/04/2015  . Screening for prostate cancer 12/09/2014  . Sessile colonic polyp     History reviewed. No pertinent family history.  SOCIAL HISTORY: Social History   Socioeconomic History  . Marital status: Married    Spouse name: Not on file  . Number of children: Not on file  . Years of education: Not on file  . Highest education level: Not on file  Occupational History  . Not on file  Tobacco Use  . Smoking status: Former Smoker    Quit date: 07/17/2003    Years since quitting: 16.3  . Smokeless tobacco: Never Used  Substance and Sexual Activity  . Alcohol use: No  . Drug use: No  . Sexual activity: Not on file  Other Topics Concern  . Not on file  Social History Narrative  . Not on file   Social Determinants of Health   Financial Resource Strain:   . Difficulty of Paying Living Expenses:   Food Insecurity:   . Worried About Charity fundraiser in the  Last Year:   . [place] in the Last Year:   Transportation Needs:   . Film/video editor (Medical):   Marland Kitchen Lack of Transportation (Non-Medical):   Physical Activity:   . Days of Exercise per Week:   . Minutes of Exercise per Session:   Stress:   . Feeling of Stress :   Social Connections:   . Frequency of Communication with Friends and Family:   . Frequency of Social Gatherings with Friends and Family:   . Attends Religious Services:   . Active Member of Clubs or Organizations:   . Attends Archivist Meetings:   Marland Kitchen Marital Status:   Intimate Partner Violence:   . Fear of Current or  Ex-Partner:   . Emotionally Abused:   Marland Kitchen Physically Abused:   . Sexually Abused:     Allergies  Allergen Reactions  . Crestor [Rosuvastatin] Other (See Comments)    Severe myalgias    Current Outpatient Medications  Medication Sig Dispense Refill  . aspirin 81 MG tablet Take 81 mg by mouth daily.    Marland Kitchen atorvastatin (LIPITOR) 80 MG tablet Take 1 tablet (80 mg total) by mouth at bedtime. No further refills, needs OV/labs 90 tablet 3  . benazepril (LOTENSIN) 10 MG tablet Take 1 tablet (10 mg total) by mouth daily. 90 tablet 3  . cetirizine (ZYRTEC) 10 MG tablet Take 1 tablet (10 mg total) by mouth daily. 30 tablet 11  . fluticasone (FLONASE) 50 MCG/ACT nasal spray Place 2 sprays into both nostrils daily. 16 g 6  . levocetirizine (XYZAL) 5 MG tablet Take 1 tablet (5 mg total) by mouth every evening. 90 tablet 3  . metFORMIN (GLUCOPHAGE) 500 MG tablet Take 1 tablet (500 mg total) by mouth 2 (two) times daily with a meal. (Patient taking differently: Take 500 mg by mouth daily with breakfast. ) 180 tablet 3  . Multiple Vitamins-Minerals (MULTIVITAMIN PO) Take 1 tablet by mouth daily. Over 50 mvi qd    . naproxen (NAPROSYN) 500 MG tablet Take 1 tablet (500 mg total) by mouth 2 (two) times daily with a meal. 30 tablet 0  . Omega-3 Fatty Acids (OMEGA-3 FISH OIL PO) Take 600 mg by mouth daily.      No current facility-administered medications for this visit.    REVIEW OF SYSTEMS:  [X]  denotes positive finding, [ ]  denotes negative finding Cardiac  Comments:  Chest pain or chest pressure:    Shortness of breath upon exertion:    Short of breath when lying flat:    Irregular heart rhythm:        Vascular    Pain in calf, thigh, or hip brought on by ambulation:    Pain in feet at night that wakes you up from your sleep:     Blood clot in your veins:    Leg swelling:         Pulmonary    Oxygen at home:    Productive cough:     Wheezing:         Neurologic    Sudden weakness in arms  or legs:     Sudden numbness in arms or legs:     Sudden onset of difficulty speaking or slurred speech:    Temporary loss of vision in one eye:     Problems with dizziness:         Gastrointestinal    Blood in stool:     Vomited blood:  Genitourinary    Burning when urinating:     Blood in urine:        Psychiatric    Major depression:         Hematologic    Bleeding problems:    Problems with blood clotting too easily:        Skin    Rashes or ulcers:        Constitutional    Fever or chills:     PHYSICAL EXAM:   Vitals:   12/04/19 1315 12/04/19 1318  BP: (!) 153/77 (!) 155/86  Pulse: (!) 52   Resp: 16   Temp: 98 F (36.7 C)   TempSrc: Temporal   SpO2: 98%   Weight: 211 lb 6.4 oz (95.9 kg)   Height: 5\' 10"  (1.778 m)     GENERAL: The patient is a well-nourished male, in no acute distress. The vital signs are documented above. CARDIAC: There is a regular rate and rhythm.  VASCULAR: I do not detect carotid bruits. Has a palpable dorsalis pedis and posterior tibial pulse bilaterally. He has significant varicose veins along the medial and posterior aspect of his right leg that are under significantly elevated pressure.   He also may have an incompetent perforator down the distal medial right leg.   I looked at the small saphenous vein myself with the SonoSite.  There is an incompetent segment proximally but it is a fairly short segment before it feeds this large cluster of varicose veins.  PULMONARY: There is good air exchange bilaterally without wheezing or rales. ABDOMEN: Soft and non-tender with normal pitched bowel sounds.  MUSCULOSKELETAL: There are no major deformities or cyanosis. NEUROLOGIC: No focal weakness or paresthesias are detected. SKIN: There are no ulcers or rashes noted. PSYCHIATRIC: The patient has a normal affect.  DATA:    VENOUS DUPLEX: I have independently interpreted the venous duplex of his right lower extremity.  There is  no evidence of DVT or superficial venous thrombosis.  There is deep venous reflux involving the common femoral vein.  There is some reflux in the great saphenous vein in the mid thigh only and and in the proximal calf.  There is no reflux at the saphenofemoral junction.  There is reflux in the proximal small saphenous vein at the saphenous popliteal junction and proximal saphenous vein.

## 2019-12-09 DIAGNOSIS — I8393 Asymptomatic varicose veins of bilateral lower extremities: Secondary | ICD-10-CM

## 2020-03-04 ENCOUNTER — Ambulatory Visit: Payer: BC Managed Care – PPO | Admitting: Vascular Surgery

## 2020-04-08 ENCOUNTER — Other Ambulatory Visit: Payer: Self-pay

## 2020-04-08 ENCOUNTER — Encounter: Payer: Self-pay | Admitting: Vascular Surgery

## 2020-04-08 ENCOUNTER — Ambulatory Visit: Payer: BC Managed Care – PPO | Admitting: Vascular Surgery

## 2020-04-08 VITALS — BP 147/84 | HR 50 | Temp 97.7°F | Resp 18 | Ht 70.0 in | Wt 211.0 lb

## 2020-04-08 DIAGNOSIS — I83811 Varicose veins of right lower extremities with pain: Secondary | ICD-10-CM

## 2020-04-08 NOTE — Progress Notes (Signed)
REASON FOR VISIT:   Follow-up of painful varicose veins of the right lower extremity  MEDICAL ISSUES:   PAINFUL VARICOSE VEINS RIGHT LOWER EXTREMITY: This patient has painful varicose veins of the right lower extremity.  He continues to have significant aching pain and heaviness in the right leg which is aggravated by exercise and standing and relieved with elevation and compression.  Again I looked at his small saphenous vein with the SonoSite and I do not think there is an adequate length to treat with laser ablation.  However I think he is a good candidate for stab phlebectomies of the varicose veins in his posterior right leg and medial calf which are under significant pressure.  I think he would require 10-20 stabs.  We have discussed the indications of the procedure and the potential complications and he is agreeable to proceed.  HPI:   Ian Leblanc is a pleasant 59 y.o. male who I saw in consultation on 12/04/2019 with painful varicose veins of the right lower extremity.  He had a large cluster of varicose veins in his medial and posterior right calf being fed from an incompetent proximal small saphenous vein.  The veins were under significant pressure and were causing significant symptoms.  We discussed conservative treatment and had him fitted for thigh-high compression stockings with a gradient of 20 to 30 mmHg.  Encouraged him to elevate his legs and take ibuprofen as needed for pain.  I felt that if conservative measures failed he would be in candidate for 10-20 stab phlebectomies of the right lower extremity.  The incompetent small saphenous vein which fed this cluster of varicosities was less than 3 cm in length and therefore I did not think he be a good candidate for laser ablation of the short segment.  He comes in for 49-month follow-up visit.  Since I saw him last, he continues to have pain in his right leg which is aggravated by standing and with exercise.  He has been wearing  his thigh-high compression stockings which he says do help his symptoms considerably.  He also elevates his legs.  However he is continuing to have significant symptoms.  Past Medical History:  Diagnosis Date  . Abnormal EKG 07/16/2013  . Cervical spine disease 02/04/2015  . Chest pain 07/16/2013  . Hyperglycemia   . Hyperlipidemia   . Hypertension   . Neck pain on left side 02/04/2015  . Numbness of fingers 02/04/2015  . Paresthesia of left arm 02/04/2015  . Screening for prostate cancer 12/09/2014  . Sessile colonic polyp     History reviewed. No pertinent family history.  SOCIAL HISTORY: Social History   Tobacco Use  . Smoking status: Former Smoker    Quit date: 07/17/2003    Years since quitting: 16.7  . Smokeless tobacco: Never Used  Substance Use Topics  . Alcohol use: No    Allergies  Allergen Reactions  . Crestor [Rosuvastatin] Other (See Comments)    Severe myalgias    Current Outpatient Medications  Medication Sig Dispense Refill  . aspirin 81 MG tablet Take 81 mg by mouth daily.    Marland Kitchen atorvastatin (LIPITOR) 80 MG tablet Take 1 tablet (80 mg total) by mouth at bedtime. No further refills, needs OV/labs 90 tablet 3  . benazepril (LOTENSIN) 10 MG tablet Take 1 tablet (10 mg total) by mouth daily. 90 tablet 3  . cetirizine (ZYRTEC) 10 MG tablet Take 1 tablet (10 mg total) by mouth daily. 30 tablet 11  .  fluticasone (FLONASE) 50 MCG/ACT nasal spray Place 2 sprays into both nostrils daily. 16 g 6  . levocetirizine (XYZAL) 5 MG tablet Take 1 tablet (5 mg total) by mouth every evening. 90 tablet 3  . metFORMIN (GLUCOPHAGE) 500 MG tablet Take 1 tablet (500 mg total) by mouth 2 (two) times daily with a meal. (Patient taking differently: Take 500 mg by mouth daily with breakfast. ) 180 tablet 3  . Multiple Vitamins-Minerals (MULTIVITAMIN PO) Take 1 tablet by mouth daily. Over 50 mvi qd    . naproxen (NAPROSYN) 500 MG tablet Take 1 tablet (500 mg total) by mouth 2 (two) times  daily with a meal. 30 tablet 0  . Omega-3 Fatty Acids (OMEGA-3 FISH OIL PO) Take 600 mg by mouth daily.      No current facility-administered medications for this visit.    REVIEW OF SYSTEMS:  [X]  denotes positive finding, [ ]  denotes negative finding Cardiac  Comments:  Chest pain or chest pressure:    Shortness of breath upon exertion:    Short of breath when lying flat:    Irregular heart rhythm:        Vascular    Pain in calf, thigh, or hip brought on by ambulation:    Pain in feet at night that wakes you up from your sleep:     Blood clot in your veins:    Leg swelling:         Pulmonary    Oxygen at home:    Productive cough:     Wheezing:         Neurologic    Sudden weakness in arms or legs:     Sudden numbness in arms or legs:     Sudden onset of difficulty speaking or slurred speech:    Temporary loss of vision in one eye:     Problems with dizziness:         Gastrointestinal    Blood in stool:     Vomited blood:         Genitourinary    Burning when urinating:     Blood in urine:        Psychiatric    Major depression:         Hematologic    Bleeding problems:    Problems with blood clotting too easily:        Skin    Rashes or ulcers:        Constitutional    Fever or chills:     PHYSICAL EXAM:   Vitals:   04/08/20 1536  BP: (!) 147/84  Pulse: (!) 50  Resp: 18  Temp: 97.7 F (36.5 C)  TempSrc: Temporal  SpO2: 96%  Weight: 95.7 kg  Height: 5\' 10"  (1.778 m)    GENERAL: The patient is a well-nourished male, in no acute distress. The vital signs are documented above. CARDIAC: There is a regular rate and rhythm.  VASCULAR: He has dilated varicose veins in his posterior right calf and medial calf that are under significant pressure. I did look at his small saphenous vein again with the SonoSite.  The incompetent segment which feeds these varicose veins is less than 3 cm and therefore I do not think he is a good candidate for laser ablation  of the segment. PULMONARY: There is good air exchange bilaterally without wheezing or rales. ABDOMEN: Soft and non-tender with normal pitched bowel sounds.  MUSCULOSKELETAL: There are no major deformities or cyanosis. NEUROLOGIC: No  focal weakness or paresthesias are detected. SKIN: There are no ulcers or rashes noted. PSYCHIATRIC: The patient has a normal affect.  DATA:    No new data  Deitra [facility] [phone]

## 2020-04-21 ENCOUNTER — Telehealth: Payer: Self-pay | Admitting: *Deleted

## 2020-04-21 NOTE — Telephone Encounter (Signed)
Spoke with Ian Leblanc to let him know that [employer] [place] will not cover stab phlebectomy 10-20 incisions of right leg that Dr. Scot Dock had recommended. [employer]  only deems stab phlebectomy to be medically necessary if it is done with a laser ablation or following a laser ablation.  Ian Leblanc states he will defer the procedure at this time as he does not want to pay out of pocket for the procedure.

## 2020-05-07 ENCOUNTER — Encounter: Payer: BC Managed Care – PPO | Admitting: Family Medicine

## 2020-05-11 ENCOUNTER — Ambulatory Visit (INDEPENDENT_AMBULATORY_CARE_PROVIDER_SITE_OTHER): Payer: BC Managed Care – PPO | Admitting: Family Medicine

## 2020-05-11 ENCOUNTER — Encounter: Payer: Self-pay | Admitting: Family Medicine

## 2020-05-11 ENCOUNTER — Other Ambulatory Visit: Payer: Self-pay

## 2020-05-11 VITALS — BP 130/76 | HR 68 | Temp 98.0°F | Resp 18 | Ht 70.0 in | Wt 210.8 lb

## 2020-05-11 DIAGNOSIS — Z0000 Encounter for general adult medical examination without abnormal findings: Secondary | ICD-10-CM | POA: Diagnosis not present

## 2020-05-11 DIAGNOSIS — I10 Essential (primary) hypertension: Secondary | ICD-10-CM | POA: Diagnosis not present

## 2020-05-11 DIAGNOSIS — Z6830 Body mass index (BMI) 30.0-30.9, adult: Secondary | ICD-10-CM

## 2020-05-11 DIAGNOSIS — E782 Mixed hyperlipidemia: Secondary | ICD-10-CM | POA: Diagnosis not present

## 2020-05-11 DIAGNOSIS — Z5181 Encounter for therapeutic drug level monitoring: Secondary | ICD-10-CM | POA: Diagnosis not present

## 2020-05-11 DIAGNOSIS — I83811 Varicose veins of right lower extremities with pain: Secondary | ICD-10-CM

## 2020-05-11 DIAGNOSIS — E1165 Type 2 diabetes mellitus with hyperglycemia: Secondary | ICD-10-CM | POA: Diagnosis not present

## 2020-05-11 DIAGNOSIS — E669 Obesity, unspecified: Secondary | ICD-10-CM

## 2020-05-11 DIAGNOSIS — R399 Unspecified symptoms and signs involving the genitourinary system: Secondary | ICD-10-CM

## 2020-05-11 MED ORDER — BENAZEPRIL HCL 10 MG PO TABS: 10.0000 mg | ORAL_TABLET | Freq: Every day | ORAL | 3 refills | Status: DC

## 2020-05-11 MED ORDER — METFORMIN HCL 500 MG PO TABS: 500.0000 mg | ORAL_TABLET | Freq: Every day | ORAL | 3 refills | Status: DC

## 2020-05-11 NOTE — Progress Notes (Signed)
Patient: Ian Leblanc, Male    DOB: [birth year], 59 y.o.   MRN: [id number] Delsa Grana, PA-C Visit Date: 05/11/2020  Today's Provider: Delsa Grana, PA-C   Chief Complaint  Patient presents with  . Annual Exam   Subjective:   Annual physical exam:  Ian Leblanc is a 59 y.o. male who presents today for health maintenance and annual & complete physical exam.   Exercise/Activity:  Running and biking most days of the week  Diet/nutrition:  Healthy, low salt  Sleep:  sleeping well  ED and BPH with LUTS improved after seeing urology - treated with cialis, much better symptom improvement then with other medications like tamsulosin  Hypertension:  Currently managed on benazepril 10 mg  Pt reports good med compliance and denies any SE.   Blood pressure today is well controlled. BP Readings from Last 3 Encounters:  05/11/20 130/76  04/08/20 (!) 147/84  12/04/19 (!) 155/86   Pt denies CP, SOB, exertional sx, LE edema, palpitation, Ha's, visual disturbances, lightheadedness, hypotension, syncope. Dietary efforts for BP?  Still working on low salt   DM:   Pt managing DM with metformin 500 mg once daily Reports good med compliance Pt has no SE from meds Denies: Polyuria, polydipsia, vision changes, neuropathy, hypoglycemia Recent pertinent labs: Lab Results  Component Value Date   HGBA1C 5.3 11/05/2019   HGBA1C 5.4 07/04/2019   HGBA1C 5.3 09/30/2018   Standard of care and health maintenance: Foot exam:  Due Nov DM eye exam:  done ACEI/ARB: On benazepril Statin: Lipitor 80  Right leg vascular insufficiencies and varicose veins have been significantly improved since wearing thigh-high compression stockings after evaluation by vascular specialist in [facility]     USPSTF grade A and B recommendations - reviewed and addressed today  Depression:  Phq 9 completed today by patient, was reviewed by me with patient in the room, score is  negative, pt feels mood is  good PHQ 2/9 Scores 05/11/2020 05/11/2020 11/05/2019 07/04/2019  PHQ - 2 Score 0 0 0 0  PHQ- 9 Score - - 0 0   Depression screen [facility] 2/9 05/11/2020 05/11/2020 11/05/2019 07/04/2019 06/26/2018  Decreased Interest 0 0 0 0 0  Down, Depressed, Hopeless 0 0 0 0 0  PHQ - 2 Score 0 0 0 0 0  Altered sleeping - - 0 0 -  Tired, decreased energy - - 0 0 -  Change in appetite - - 0 0 -  Feeling bad or failure about yourself  - - 0 0 -  Trouble concentrating - - 0 0 -  Moving slowly or fidgety/restless - - 0 0 -  Suicidal thoughts - - 0 0 -  PHQ-9 Score - - 0 0 -  Difficult doing work/chores - - Not difficult at all - -    Hep C Screening: 05/23/2017 STD testing and prevention (HIV/chl/gon/syphilis): 05/23/2017 Intimate partner violence:none, feels safe at home   Prostate cancer: defer to urology  Lab Results  Component Value Date   PSA 1.0 05/23/2017   PSA 0.88 12/16/2015   PSA 0.81 12/09/2014   Urinary Symptoms:  IPSS Questionnaire (AUA-7): Over the past month.   1)  How often have you had a sensation of not emptying your bladder completely after you finish urinating?  1 - Less than 1 time in 5  2)  How often have you had to urinate again less than two hours after you finished urinating? 1 - Less than 1 time in 5  3)  How often have you found you stopped and started again several times when you urinated?  0 - Not at all  4) How difficult have you found it to postpone urination?  0 - Not at all  5) How often have you had a weak urinary stream?  1 - Less than 1 time in 5  6) How often have you had to push or strain to begin urination?  0 - Not at all  7) How many times did you most typically get up to urinate from the time you went to bed until the time you got up in the morning?  1 - 1 time  Total score:  0-7 mildly symptomatic   8-19 moderately symptomatic   20-35 severely symptomatic    Advanced Care Planning:  A voluntary discussion about advance care planning including the  explanation and discussion of advance directives.  Discussed health care proxy and Living will, and the patient was able to identify a health care proxy as his wife.  Patient does not have a living will at present time. If patient does have living will, I have requested they bring this to the clinic to be scanned in to their chart.  Health Maintenance  Topic Date Due  . URINE MICROALBUMIN  03/29/2019  . HEMOGLOBIN A1C  05/07/2020  . FOOT EXAM  07/03/2020  . OPHTHALMOLOGY EXAM  10/19/2020  . TETANUS/TDAP  03/23/2021  . COLONOSCOPY  08/01/2028  . INFLUENZA VACCINE  Completed  . PNEUMOCOCCAL POLYSACCHARIDE VACCINE AGE 15-64 HIGH RISK  Completed  . COVID-19 Vaccine  Completed  . Hepatitis C Screening  Completed  . HIV Screening  Completed     Skin cancer:     Pt reports no hx of skin cancer, suspicious lesions/biopsies in the past.  Colorectal cancer:  colonoscopy is 08/01/2018 Pt denies melena, hematochezia  Lung cancer:   Low Dose CT Chest recommended if Age 61-80 years, 46 pack-year currently smoking OR have quit w/in 15years. Patient does not qualify.   Social History   Tobacco Use  . Smoking status: Former Smoker    Quit date: 07/17/2003    Years since quitting: 16.8  . Smokeless tobacco: Never Used  Substance Use Topics  . Alcohol use: No     Alcohol screening:   [facility] Visit from 05/11/2020 in [facility]  AUDIT-C Score 1      AAA: N/A  The USPSTF recommends one-time screening with ultrasonography in men ages 23 to 37 years who have ever smoked  ECG:N/A  Blood pressure/Hypertension: BP Readings from Last 3 Encounters:  05/11/20 130/76  04/08/20 (!) 147/84  12/04/19 (!) 155/86   Weight/Obesity: Wt Readings from Last 3 Encounters:  05/11/20 210 lb 12.8 oz (95.6 kg)  04/08/20 211 lb (95.7 kg)  12/04/19 211 lb 6.4 oz (95.9 kg)   BMI Readings from Last 3 Encounters:  05/11/20 30.25 kg/m  04/08/20 30.28 kg/m  12/04/19 30.33 kg/m     Lipids:  Lab Results  Component Value Date   CHOL 212 (H) 11/05/2019   CHOL 138 07/04/2019   CHOL 123 09/30/2018   Lab Results  Component Value Date   HDL 36 (L) 11/05/2019   HDL 45 07/04/2019   HDL 45 09/30/2018   Lab Results  Component Value Date   LDLCALC 145 (H) 11/05/2019   LDLCALC 77 07/04/2019   LDLCALC 64 09/30/2018   Lab Results  Component Value Date   TRIG 169 (H) 11/05/2019   TRIG 82  07/04/2019   TRIG 64 09/30/2018   Lab Results  Component Value Date   CHOLHDL 5.9 (H) 11/05/2019   CHOLHDL 3.1 07/04/2019   CHOLHDL 2.7 09/30/2018   No results found for: LDLDIRECT Based on the results of lipid panel his/her cardiovascular risk factor ( using [facility] )  in the next 10 years is : The 10-year ASCVD risk score Mikey Bussing DC Brooke Bonito., et al., 2013) is: 22.9%   Values used to calculate the score:     Age: 67 years     Sex: Male     Is Non-Hispanic African American: No     Diabetic: Yes     Tobacco smoker: No     Systolic Blood Pressure: 811 mmHg     Is BP treated: No     HDL Cholesterol: 36 mg/dL     Total Cholesterol: 212 mg/dL Glucose:  Glucose, Bld  Date Value Ref Range Status  11/05/2019 94 65 - 99 mg/dL Final    Comment:    .            Fasting reference interval .   07/04/2019 92 65 - 99 mg/dL Final    Comment:    .            Fasting reference interval .   09/30/2018 88 65 - 99 mg/dL Final    Comment:    .            Fasting reference interval .     Social History      He  reports that he quit smoking about 16 years ago. He has never used smokeless tobacco. He reports that he does not drink alcohol and does not use drugs.       Social History   Socioeconomic History  . Marital status: Married    Spouse name: Not on file  . Number of children: Not on file  . Years of education: Not on file  . Highest education level: Not on file  Occupational History  . Not on file  Tobacco Use  . Smoking status: Former Smoker    Quit date:  07/17/2003    Years since quitting: 16.8  . Smokeless tobacco: Never Used  Vaping Use  . Vaping Use: Never used  Substance and Sexual Activity  . Alcohol use: No  . Drug use: No  . Sexual activity: Yes  Other Topics Concern  . Not on file  Social History Narrative  . Not on file   Social Determinants of Health   Financial Resource Strain: Low Risk   . Difficulty of Paying Living Expenses: Not hard at all  Food Insecurity: No Food Insecurity  . Worried About Charity fundraiser in the Last Year: Never true  . Ran Out of Food in the Last Year: Never true  Transportation Needs: No Transportation Needs  . Lack of Transportation (Medical): No  . Lack of Transportation (Non-Medical): No  Physical Activity: Sufficiently Active  . Days of Exercise per Week: 6 days  . Minutes of Exercise per Session: 40 min  Stress: No Stress Concern Present  . Feeling of Stress : Not at all  Social Connections: Moderately Isolated  . Frequency of Communication with Friends and Family: More than three times a week  . Frequency of Social Gatherings with Friends and Family: More than three times a week  . Attends Religious Services: Never  . Active Member of Clubs or Organizations: No  . Attends Club or  Organization Meetings: Never  . Marital Status: Married     Family History        Family Status  Relation Name Status  . Mother  Alive  . Father  Deceased       pneumonia  . Sister  Alive  . Brother  Alive        His family history is not on file.      History reviewed. No pertinent family history.  Patient Active Problem List   Diagnosis Date Noted  . Controlled type 2 diabetes mellitus with hyperglycemia, without long-term current use of insulin ([place]) 07/04/2019  . Allergic rhinitis 07/03/2019  . Cervicalgia 02/04/2015  . Diabetes mellitus type 2, uncomplicated ([place]) 60/63/0160  . HTN (hypertension) 10/10/2012  . HLD (hyperlipidemia) 10/10/2012  . History of colonic polyps 10/10/2012     Past Surgical History:  Procedure Laterality Date  . BACK SURGERY     cyst removal     Current Outpatient Medications:  .  aspirin 81 MG tablet, Take 81 mg by mouth daily., Disp: , Rfl:  .  atorvastatin (LIPITOR) 80 MG tablet, Take 1 tablet (80 mg total) by mouth at bedtime. No further refills, needs OV/labs, Disp: 90 tablet, Rfl: 3 .  [START ON 06/21/2020] benazepril (LOTENSIN) 10 MG tablet, Take 1 tablet (10 mg total) by mouth daily., Disp: 90 tablet, Rfl: 3 .  cetirizine (ZYRTEC) 10 MG tablet, Take 1 tablet (10 mg total) by mouth daily., Disp: 30 tablet, Rfl: 11 .  fluticasone (FLONASE) 50 MCG/ACT nasal spray, Place 2 sprays into both nostrils daily., Disp: 16 g, Rfl: 6 .  levocetirizine (XYZAL) 5 MG tablet, Take 1 tablet (5 mg total) by mouth every evening., Disp: 90 tablet, Rfl: 3 .  metFORMIN (GLUCOPHAGE) 500 MG tablet, Take 1 tablet (500 mg total) by mouth daily with breakfast., Disp: 90 tablet, Rfl: 3 .  Multiple Vitamins-Minerals (MULTIVITAMIN PO), Take 1 tablet by mouth daily. Over 50 mvi qd, Disp: , Rfl:  .  naproxen (NAPROSYN) 500 MG tablet, Take 1 tablet (500 mg total) by mouth 2 (two) times daily with a meal., Disp: 30 tablet, Rfl: 0 .  Omega-3 Fatty Acids (OMEGA-3 FISH OIL PO), Take 600 mg by mouth daily. , Disp: , Rfl:  .  tadalafil (CIALIS) 5 MG tablet, Take 5 mg by mouth daily., Disp: , Rfl:   Allergies  Allergen Reactions  . Crestor [Rosuvastatin] Other (See Comments)    Severe myalgias    Patient Care Team: Delsa Grana, PA-C as PCP - General (Family Medicine)   Chart Review: I personally reviewed active problem list, medication list, allergies, family history, social history, health maintenance, notes from last encounter, lab results, imaging with the patient/caregiver today.   Review of Systems  Constitutional: Negative.  Negative for activity change, appetite change, fatigue and unexpected weight change.  HENT: Negative.   Eyes: Negative.    Respiratory: Negative.  Negative for shortness of breath.   Cardiovascular: Negative.  Negative for chest pain, palpitations and leg swelling.  Gastrointestinal: Negative.  Negative for abdominal pain and blood in stool.  Endocrine: Negative.   Genitourinary: Negative.  Negative for decreased urine volume, difficulty urinating, testicular pain and urgency.  Skin: Negative.  Negative for color change and pallor.  Allergic/Immunologic: Negative.   Neurological: Negative.  Negative for syncope, weakness, light-headedness and numbness.  Psychiatric/Behavioral: Negative.  Negative for confusion, dysphoric mood, self-injury and suicidal ideas. The patient is not nervous/anxious.   All other systems reviewed  and are negative.         Objective:   Vitals:  Vitals:   05/11/20 1520  BP: 130/76  Pulse: 68  Resp: 18  Temp: 98 F (36.7 C)  TempSrc: Oral  SpO2: 96%  Weight: 210 lb 12.8 oz (95.6 kg)  Height: 5\' 10"  (1.778 m)    Body mass index is 30.25 kg/m.  Physical Exam Constitutional:      General: He is not in acute distress.    Appearance: Normal appearance. He is well-developed. He is not ill-appearing or toxic-appearing.  HENT:     Head: Normocephalic and atraumatic.     Jaw: No trismus.     Right Ear: Tympanic membrane, ear canal and external ear normal.     Left Ear: Tympanic membrane, ear canal and external ear normal.     Nose: No mucosal edema or rhinorrhea.     Right Sinus: No maxillary sinus tenderness or frontal sinus tenderness.     Left Sinus: No maxillary sinus tenderness or frontal sinus tenderness.     Mouth/Throat:     Pharynx: Uvula midline. No oropharyngeal exudate, posterior oropharyngeal erythema or uvula swelling.  Eyes:     General: Lids are normal.     Conjunctiva/sclera: Conjunctivae normal.     Pupils: Pupils are equal, round, and reactive to light.  Neck:     Trachea: Trachea and phonation normal. No tracheal deviation.  Cardiovascular:     Rate  and Rhythm: Regular rhythm.     Pulses: Normal pulses.          Radial pulses are 2+ on the right side and 2+ on the left side.       Posterior tibial pulses are 2+ on the right side and 2+ on the left side.     Heart sounds: Normal heart sounds. No murmur heard.  No friction rub. No gallop.   Pulmonary:     Effort: Pulmonary effort is normal.     Breath sounds: Normal breath sounds. No wheezing, rhonchi or rales.  Abdominal:     General: Bowel sounds are normal. There is no distension.     Palpations: Abdomen is soft.     Tenderness: There is no abdominal tenderness. There is no guarding or rebound.  Musculoskeletal:        General: Normal range of motion.     Cervical back: Normal range of motion and neck supple.  Skin:    General: Skin is warm and dry.     Capillary Refill: Capillary refill takes less than 2 seconds.     Findings: No rash.  Neurological:     Mental Status: He is alert and oriented to person, place, and time.     Gait: Gait normal.  Psychiatric:        Speech: Speech normal.        Behavior: Behavior normal.      No results found for this or any previous visit (from the past 2160 hour(s)).  Diabetic Foot Exam: Diabetic Foot Exam - Simple   Simple Foot Form Diabetic Foot exam was performed with the following findings: Yes 05/11/2020  4:00 PM  Visual Inspection Sensation Testing Pulse Check Comments     Fall Risk: Fall Risk  05/11/2020 11/05/2019 07/04/2019 06/26/2018 05/07/2018  Falls in the past year? 0 0 0 0 No  Number falls in past yr: 0 0 0 - -  Injury with Fall? 0 0 0 - -  Follow up Falls evaluation  completed - - Falls evaluation completed -    Functional Status Survey: Is the patient deaf or have difficulty hearing?: Yes Does the patient have difficulty seeing, even when wearing glasses/contacts?: No Does the patient have difficulty concentrating, remembering, or making decisions?: No Does the patient have difficulty walking or climbing  stairs?: No Does the patient have difficulty dressing or bathing?: No Does the patient have difficulty doing errands alone such as visiting a doctor's office or shopping?: No   Assessment & Plan:    CPE completed today  . Prostate cancer screening and PSA options (with potential risks and benefits of testing vs not testing) were discussed along with recent recs/guidelines, shared decision making and handout/information given to pt today  . USPSTF grade A and B recommendations reviewed with patient; age-appropriate recommendations, preventive care, screening tests, etc discussed and encouraged; healthy living encouraged; see AVS for patient education given to patient  . Discussed importance of 150 minutes of physical activity weekly, AHA exercise recommendations given to pt in AVS/handout  . Discussed importance of healthy diet:  eating lean meats and proteins, avoiding trans fats and saturated fats, avoid simple sugars and excessive carbs in diet, eat 6 servings of fruit/vegetables daily and drink plenty of water and avoid sweet beverages.  DASH diet reviewed if pt has HTN  . Recommended pt to do annual eye exam and routine dental exams/cleanings  . Reviewed Health Maintenance: Health Maintenance  Topic Date Due  . URINE MICROALBUMIN  03/29/2019  . HEMOGLOBIN A1C  05/07/2020  . FOOT EXAM  07/03/2020  . OPHTHALMOLOGY EXAM  10/19/2020  . TETANUS/TDAP  03/23/2021  . COLONOSCOPY  08/01/2028  . INFLUENZA VACCINE  Completed  . PNEUMOCOCCAL POLYSACCHARIDE VACCINE AGE 71-64 HIGH RISK  Completed  . COVID-19 Vaccine  Completed  . Hepatitis C Screening  Completed  . HIV Screening  Completed    . Immunizations: Immunization History  Administered Date(s) Administered  . Influenza Split 05/15/2013  . Influenza Whole 05/08/2012  . Influenza,inj,Quad PF,6+ Mos 06/14/2015, 05/15/2017, 06/05/2018, 04/23/2019  . Influenza-Unspecified 05/30/2014, 05/15/2017, 04/23/2020  . PFIZER SARS-COV-2  Vaccination 10/31/2019, 11/24/2019  . Pneumococcal Polysaccharide-23 03/09/2014  . Tdap 03/24/2011     ICD-10-CM   1. Annual physical exam  Z00.00 Hemoglobin A1C    Lipid panel    COMPLETE METABOLIC PANEL WITH GFR    CBC w/Diff/Platelet  2. Controlled type 2 diabetes mellitus with hyperglycemia, without long-term current use of insulin (H[facility])  E11.65 Hemoglobin A1C    Lipid panel    COMPLETE METABOLIC PANEL WITH GFR    metFORMIN (GLUCOPHAGE) 500 MG tablet    benazepril (LOTENSIN) 10 MG tablet   Well-controlled diabetes he has been able to decrease his Metformin dose and remain controlled, continue to work on healthy diet  3. Hypertension, unspecified type  J19 COMPLETE METABOLIC PANEL WITH GFR    benazepril (LOTENSIN) 10 MG tablet   Stable, well-controlled hypertension on benazepril continue DASH diet and other lifestyle efforts  4. Mixed hyperlipidemia  E78.2 Lipid panel    COMPLETE METABOLIC PANEL WITH GFR   poorly controlled HLD with last labs he has improved med compliance no myalgias or concerns with statin recheck labs today  5. Class 1 obesity with serious comorbidity and body mass index (BMI) of 30.0 to 30.9 in adult, unspecified obesity type  E66.9 Hemoglobin A1C   Z68.30 Lipid panel    COMPLETE METABOLIC PANEL WITH GFR    CBC w/Diff/Platelet   Associated comorbidities DM hypertension hyperlipidemia -  he has improving his diet and activity level over the past couple years and BMI has improved  6. Varicose veins of right lower extremity with pain  I83.811    improved sx after consult with vascular specialists  7. Encounter for medication monitoring  Z51.81 Hemoglobin A1C    Lipid panel    COMPLETE METABOLIC PANEL WITH GFR    CBC w/Diff/Platelet  8. Lower urinary tract symptoms (LUTS)  R39.9 tadalafil (CIALIS) 5 MG tablet   managed by urology - improved nocturia with cialis daily - IPSS score 4 mild sx today       Delsa Grana, PA-C 05/11/20 4:05 PM  [facility]

## 2020-05-11 NOTE — Patient Instructions (Signed)
Preventive Care 40-61 Years Old, Male °Preventive care refers to lifestyle choices and visits with your health care provider that can promote health and wellness. This includes: °· A yearly physical exam. This is also called an annual well check. °· Regular dental and eye exams. °· Immunizations. °· Screening for certain conditions. °· Healthy lifestyle choices, such as eating a healthy diet, getting regular exercise, not using drugs or products that contain nicotine and tobacco, and limiting alcohol use. °What can I expect for my preventive care visit? °Physical exam °Your health care provider will check: °· Height and weight. These may be used to calculate body mass index (BMI), which is a measurement that tells if you are at a healthy weight. °· Heart rate and blood pressure. °· Your skin for abnormal spots. °Counseling °Your health care provider may ask you questions about: °· Alcohol, tobacco, and drug use. °· Emotional well-being. °· Home and relationship well-being. °· Sexual activity. °· Eating habits. °· Work and work environment. °What immunizations do I need? ° °Influenza (flu) vaccine °· This is recommended every year. °Tetanus, diphtheria, and pertussis (Tdap) vaccine °· You may need a Td booster every 10 years. °Varicella (chickenpox) vaccine °· You may need this vaccine if you have not already been vaccinated. °Zoster (shingles) vaccine °· You may need this after age 60. °Measles, mumps, and rubella (MMR) vaccine °· You may need at least one dose of MMR if you were born in 1957 or later. You may also need a second dose. °Pneumococcal conjugate (PCV13) vaccine °· You may need this if you have certain conditions and were not previously vaccinated. °Pneumococcal polysaccharide (PPSV23) vaccine °· You may need one or two doses if you smoke cigarettes or if you have certain conditions. °Meningococcal conjugate (MenACWY) vaccine °· You may need this if you have certain conditions. °Hepatitis A  vaccine °· You may need this if you have certain conditions or if you travel or work in places where you may be exposed to hepatitis A. °Hepatitis B vaccine °· You may need this if you have certain conditions or if you travel or work in places where you may be exposed to hepatitis B. °Haemophilus influenzae type b (Hib) vaccine °· You may need this if you have certain risk factors. °Human papillomavirus (HPV) vaccine °· If recommended by your health care provider, you may need three doses over 6 months. °You may receive vaccines as individual doses or as more than one vaccine together in one shot (combination vaccines). Talk with your health care provider about the risks and benefits of combination vaccines. °What tests do I need? °Blood tests °· Lipid and cholesterol levels. These may be checked every 5 years, or more frequently if you are over 50 years old. °· Hepatitis C test. °· Hepatitis B test. °Screening °· Lung cancer screening. You may have this screening every year starting at age 55 if you have a 30-pack-year history of smoking and currently smoke or have quit within the past 15 years. °· Prostate cancer screening. Recommendations will vary depending on your family history and other risks. °· Colorectal cancer screening. All adults should have this screening starting at age 50 and continuing until age 75. Your health care provider may recommend screening at age 45 if you are at increased risk. You will have tests every 1-10 years, depending on your results and the type of screening test. °· Diabetes screening. This is done by checking your blood sugar (glucose) after you have not eaten   for a while (fasting). You may have this done every 1-3 years.  Sexually transmitted disease (STD) testing. Follow these instructions at home: Eating and drinking  Eat a diet that includes fresh fruits and vegetables, whole grains, lean protein, and low-fat dairy products.  Take vitamin and mineral supplements as  recommended by your health care provider.  Do not drink alcohol if your health care provider tells you not to drink.  If you drink alcohol: ? Limit how much you have to 0-2 drinks a day. ? Be aware of how much alcohol is in your drink. In the [place] one drink equals one 12 oz bottle of beer (355 mL), one 5 oz glass of wine (148 mL), or one 1 oz glass of hard liquor (44 mL). Lifestyle  Take daily care of your teeth and gums.  Stay active. Exercise for at least 30 minutes on 5 or more days each week.  Do not use any products that contain nicotine or tobacco, such as cigarettes, e-cigarettes, and chewing tobacco. If you need help quitting, ask your health care provider.  If you are sexually active, practice safe sex. Use a condom or other form of protection to prevent STIs (sexually transmitted infections).  Talk with your health care provider about taking a low-dose aspirin every day starting at age 7. What's next?  Go to your health care provider once a year for a well check visit.  Ask your health care provider how often you should have your eyes and teeth checked.  Stay up to date on all vaccines. This information is not intended to replace advice given to you by your health care provider. Make sure you discuss any questions you have with your health care provider. Document Revised: 08/01/2018 Document Reviewed: 08/01/2018 Elsevier Patient Education  2020 [employer].  The 10-year ASCVD risk score Mikey Bussing DC Brooke Bonito., et al., 2013) is: 31.7%   Values used to calculate the score:     Age: 59 years     Sex: Male     Is Non-Hispanic African American: No     Diabetic: Yes     Tobacco smoker: No     Systolic Blood Pressure: 277 mmHg     Is BP treated: Yes     HDL Cholesterol: 36 mg/dL     Total Cholesterol: 212 mg/dL   High Cholesterol  High cholesterol is a condition in which the blood has high levels of a white, waxy, fat-like substance (cholesterol). The human body needs small  amounts of cholesterol. The liver makes all the cholesterol that the body needs. Extra (excess) cholesterol comes from the food that we eat. Cholesterol is carried from the liver by the blood through the blood vessels. If you have high cholesterol, deposits (plaques) may build up on the walls of your blood vessels (arteries). Plaques make the arteries narrower and stiffer. Cholesterol plaques increase your risk for heart attack and stroke. Work with your health care provider to keep your cholesterol levels in a healthy range. What increases the risk? This condition is more likely to develop in people who:  Eat foods that are high in animal fat (saturated fat) or cholesterol.  Are overweight.  Are not getting enough exercise.  Have a family history of high cholesterol. What are the signs or symptoms? There are no symptoms of this condition. How is this diagnosed? This condition may be diagnosed from the results of a blood test.  If you are older than age 25, your health care  provider may check your cholesterol every 4-6 years.  You may be checked more often if you already have high cholesterol or other risk factors for heart disease. The blood test for cholesterol measures:  "Bad" cholesterol (LDL cholesterol). This is the main type of cholesterol that causes heart disease. The desired level for LDL is less than 100.  "Good" cholesterol (HDL cholesterol). This type helps to protect against heart disease by cleaning the arteries and carrying the LDL away. The desired level for HDL is 60 or higher.  Triglycerides. These are fats that the body can store or burn for energy. The desired number for triglycerides is lower than 150.  Total cholesterol. This is a measure of the total amount of cholesterol in your blood, including LDL cholesterol, HDL cholesterol, and triglycerides. A healthy number is less than 200. How is this treated? This condition is treated with diet changes, lifestyle  changes, and medicines. Diet changes  This may include eating more whole grains, fruits, vegetables, nuts, and fish.  This may also include cutting back on red meat and foods that have a lot of added sugar. Lifestyle changes  Changes may include getting at least 40 minutes of aerobic exercise 3 times a week. Aerobic exercises include walking, biking, and swimming. Aerobic exercise along with a healthy diet can help you maintain a healthy weight.  Changes may also include quitting smoking. Medicines  Medicines are usually given if diet and lifestyle changes have failed to reduce your cholesterol to healthy levels.  Your health care provider may prescribe a statin medicine. Statin medicines have been shown to reduce cholesterol, which can reduce the risk of heart disease. Follow these instructions at home: Eating and drinking If told by your health care provider:  Eat chicken (without skin), fish, veal, shellfish, ground [place] breast, and round or loin cuts of red meat.  Do not eat fried foods or fatty meats, such as hot dogs and salami.  Eat plenty of fruits, such as apples.  Eat plenty of vegetables, such as broccoli, potatoes, and carrots.  Eat beans, peas, and lentils.  Eat grains such as barley, rice, couscous, and bulgur wheat.  Eat pasta without cream sauces.  Use skim or nonfat milk, and eat low-fat or nonfat yogurt and cheeses.  Do not eat or drink whole milk, cream, ice cream, egg yolks, or hard cheeses.  Do not eat stick margarine or tub margarines that contain trans fats (also called partially hydrogenated oils).  Do not eat saturated tropical oils, such as coconut oil and palm oil.  Do not eat cakes, cookies, crackers, or other baked goods that contain trans fats.  General instructions  Exercise as directed by your health care provider. Increase your activity level with activities such as gardening, walking, and taking the stairs.  Take over-the-counter and  prescription medicines only as told by your health care provider.  Do not use any products that contain nicotine or tobacco, such as cigarettes and e-cigarettes. If you need help quitting, ask your health care provider.  Keep all follow-up visits as told by your health care provider. This is important. Contact a health care provider if:  You are struggling to maintain a healthy diet or weight.  You need help to start on an exercise program.  You need help to stop smoking. Get help right away if:  You have chest pain.  You have trouble breathing. This information is not intended to replace advice given to you by your health care provider.  Make sure you discuss any questions you have with your health care provider. Document Revised: 08/10/2017 Document Reviewed: 02/05/2016 Elsevier Patient Education  [employer] DASH stands for "Dietary Approaches to Stop Hypertension." The DASH eating plan is a healthy eating plan that has been shown to reduce high blood pressure (hypertension). It may also reduce your risk for type 2 diabetes, heart disease, and stroke. The DASH eating plan may also help with weight loss. What are tips for following this plan?  General guidelines  Avoid eating more than 2,300 mg (milligrams) of salt (sodium) a day. If you have hypertension, you may need to reduce your sodium intake to 1,500 mg a day.  Limit alcohol intake to no more than 1 drink a day for nonpregnant women and 2 drinks a day for men. One drink equals 12 oz of beer, 5 oz of wine, or 1 oz of hard liquor.  Work with your health care provider to maintain a healthy body weight or to lose weight. Ask what an ideal weight is for you.  Get at least 30 minutes of exercise that causes your heart to beat faster (aerobic exercise) most days of the week. Activities may include walking, swimming, or biking.  Work with your health care provider or diet and nutrition specialist  (dietitian) to adjust your eating plan to your individual calorie needs. Reading food labels   Check food labels for the amount of sodium per serving. Choose foods with less than 5 percent of the Daily Value of sodium. Generally, foods with less than 300 mg of sodium per serving fit into this eating plan.  To find whole grains, look for the word "whole" as the first word in the ingredient list. Shopping  Buy products labeled as "low-sodium" or "no salt added."  Buy fresh foods. Avoid canned foods and premade or frozen meals. Cooking  Avoid adding salt when cooking. Use salt-free seasonings or herbs instead of table salt or sea salt. Check with your health care provider or pharmacist before using salt substitutes.  Do not fry foods. Cook foods using healthy methods such as baking, boiling, grilling, and broiling instead.  Cook with heart-healthy oils, such as olive, canola, soybean, or sunflower oil. Meal planning  Eat a balanced diet that includes: ? 5 or more servings of fruits and vegetables each day. At each meal, try to fill half of your plate with fruits and vegetables. ? Up to 6-8 servings of whole grains each day. ? Less than 6 oz of lean meat, poultry, or fish each day. A 3-oz serving of meat is about the same size as a deck of cards. One egg equals 1 oz. ? 2 servings of low-fat dairy each day. ? A serving of nuts, seeds, or beans 5 times each week. ? Heart-healthy fats. Healthy fats called Omega-3 fatty acids are found in foods such as flaxseeds and coldwater fish, like sardines, salmon, and mackerel.  Limit how much you eat of the following: ? Canned or prepackaged foods. ? Food that is high in trans fat, such as fried foods. ? Food that is high in saturated fat, such as fatty meat. ? Sweets, desserts, sugary drinks, and other foods with added sugar. ? Full-fat dairy products.  Do not salt foods before eating.  Try to eat at least 2 vegetarian meals each week.  Eat  more home-cooked food and less restaurant, buffet, and fast food.  When eating at a restaurant, ask that your  food be prepared with less salt or no salt, if possible. What foods are recommended? The items listed may not be a complete list. Talk with your dietitian about what dietary choices are best for you. Grains Whole-grain or whole-wheat bread. Whole-grain or whole-wheat pasta. Brown rice. [place]. Bulgur. Whole-grain and low-sodium cereals. Pita bread. Low-fat, low-sodium crackers. Whole-wheat flour tortillas. Vegetables Fresh or frozen vegetables (raw, steamed, roasted, or grilled). Low-sodium or reduced-sodium tomato and vegetable juice. Low-sodium or reduced-sodium tomato sauce and tomato paste. Low-sodium or reduced-sodium canned vegetables. Fruits All fresh, dried, or frozen fruit. Canned fruit in natural juice (without added sugar). Meat and other protein foods Skinless chicken or [place]. Ground chicken or [place]. Pork with fat trimmed off. Fish and seafood. Egg whites. Dried beans, peas, or lentils. Unsalted nuts, nut butters, and seeds. Unsalted canned beans. Lean cuts of beef with fat trimmed off. Low-sodium, lean deli meat. Dairy Low-fat (1%) or fat-free (skim) milk. Fat-free, low-fat, or reduced-fat cheeses. Nonfat, low-sodium ricotta or cottage cheese. Low-fat or nonfat yogurt. Low-fat, low-sodium cheese. Fats and oils Soft margarine without trans fats. Vegetable oil. Low-fat, reduced-fat, or light mayonnaise and salad dressings (reduced-sodium). Canola, safflower, olive, soybean, and sunflower oils. Avocado. Seasoning and other foods Herbs. Spices. Seasoning mixes without salt. Unsalted popcorn and pretzels. Fat-free sweets. What foods are not recommended? The items listed may not be a complete list. Talk with your dietitian about what dietary choices are best for you. Grains Baked goods made with fat, such as croissants, muffins, or some breads. Dry pasta or rice meal  packs. Vegetables Creamed or fried vegetables. Vegetables in a cheese sauce. Regular canned vegetables (not low-sodium or reduced-sodium). Regular canned tomato sauce and paste (not low-sodium or reduced-sodium). Regular tomato and vegetable juice (not low-sodium or reduced-sodium). Angie Fava. Olives. Fruits Canned fruit in a light or heavy syrup. Fried fruit. Fruit in cream or butter sauce. Meat and other protein foods Fatty cuts of meat. Ribs. Fried meat. Berniece Salines. Sausage. Bologna and other processed lunch meats. Salami. Fatback. Hotdogs. Bratwurst. Salted nuts and seeds. Canned beans with added salt. Canned or smoked fish. Whole eggs or egg yolks. Chicken or [place] with skin. Dairy Whole or 2% milk, cream, and half-and-half. Whole or full-fat cream cheese. Whole-fat or sweetened yogurt. Full-fat cheese. Nondairy creamers. Whipped toppings. Processed cheese and cheese spreads. Fats and oils Butter. Stick margarine. Lard. Shortening. Ghee. Bacon fat. Tropical oils, such as coconut, palm kernel, or palm oil. Seasoning and other foods Salted popcorn and pretzels. Onion salt, garlic salt, seasoned salt, table salt, and sea salt. Worcestershire sauce. Tartar sauce. Barbecue sauce. Teriyaki sauce. Soy sauce, including reduced-sodium. Steak sauce. Canned and packaged gravies. Fish sauce. Oyster sauce. Cocktail sauce. Horseradish that you find on the shelf. Ketchup. Mustard. Meat flavorings and tenderizers. Bouillon cubes. Hot sauce and Tabasco sauce. Premade or packaged marinades. Premade or packaged taco seasonings. Relishes. Regular salad dressings. Where to find more information:  National Heart, Lung, and [place]: https://wilson-eaton.com/  American Heart Association: www.heart.org Summary  The DASH eating plan is a healthy eating plan that has been shown to reduce high blood pressure (hypertension). It may also reduce your risk for type 2 diabetes, heart disease, and stroke.  With the DASH eating  plan, you should limit salt (sodium) intake to 2,300 mg a day. If you have hypertension, you may need to reduce your sodium intake to 1,500 mg a day.  When on the DASH eating plan, aim to eat more fresh fruits and vegetables,  whole grains, lean proteins, low-fat dairy, and heart-healthy fats.  Work with your health care provider or diet and nutrition specialist (dietitian) to adjust your eating plan to your individual calorie needs. This information is not intended to replace advice given to you by your health care provider. Make sure you discuss any questions you have with your health care provider. Document Revised: 07/20/2017 Document Reviewed: 07/31/2016 Elsevier Patient Education  2020 [employer].

## 2020-05-12 LAB — CBC WITH DIFFERENTIAL/PLATELET
Absolute Monocytes: 564 cells/uL (ref 200–950)
Basophils Absolute: 50 cells/uL (ref 0–200)
Basophils Relative: 0.8 %
Eosinophils Absolute: 130 cells/uL (ref 15–500)
Eosinophils Relative: 2.1 %
HCT: 43.8 % (ref 38.5–50.0)
Hemoglobin: 14.9 g/dL (ref 13.2–17.1)
Lymphs Abs: 1755 cells/uL (ref 850–3900)
MCH: 30 pg (ref 27.0–33.0)
MCHC: 34 g/dL (ref 32.0–36.0)
MCV: 88.3 fL (ref 80.0–100.0)
MPV: 12.2 fL (ref 7.5–12.5)
Monocytes Relative: 9.1 %
Neutro Abs: 3701 cells/uL (ref 1500–7800)
Neutrophils Relative %: 59.7 %
Platelets: 137 10*3/uL — ABNORMAL LOW (ref 140–400)
RBC: 4.96 10*6/uL (ref 4.20–5.80)
RDW: 12.8 % (ref 11.0–15.0)
Total Lymphocyte: 28.3 %
WBC: 6.2 10*3/uL (ref 3.8–10.8)

## 2020-05-12 LAB — COMPLETE METABOLIC PANEL WITH GFR
AG Ratio: 2 (calc) (ref 1.0–2.5)
ALT: 37 U/L (ref 9–46)
AST: 26 U/L (ref 10–35)
Albumin: 4.4 g/dL (ref 3.6–5.1)
Alkaline phosphatase (APISO): 68 U/L (ref 35–144)
BUN: 10 mg/dL (ref 7–25)
CO2: 24 mmol/L (ref 20–32)
Calcium: 9.2 mg/dL (ref 8.6–10.3)
Chloride: 105 mmol/L (ref 98–110)
Creat: 0.74 mg/dL (ref 0.70–1.25)
GFR, Est African American: 115 mL/min/{1.73_m2} (ref >=60)
GFR, Est Non African American: 100 mL/min/{1.73_m2} (ref >=60)
Globulin: 2.2 g/dL (calc) (ref 1.9–3.7)
Glucose, Bld: 74 mg/dL (ref 65–99)
Potassium: 3.9 mmol/L (ref 3.5–5.3)
Sodium: 140 mmol/L (ref 135–146)
Total Bilirubin: 0.9 mg/dL (ref 0.2–1.2)
Total Protein: 6.6 g/dL (ref 6.1–8.1)

## 2020-05-12 LAB — HEMOGLOBIN A1C
Hgb A1c MFr Bld: 5.4 % of total Hgb (ref <5.7)
Mean Plasma Glucose: 108 (calc)
eAG (mmol/L): 6 (calc)

## 2020-05-12 LAB — LIPID PANEL
Cholesterol: 126 mg/dL (ref <200)
HDL: 43 mg/dL (ref >=40)
LDL Cholesterol (Calc): 67 mg/dL (calc)
Non-HDL Cholesterol (Calc): 83 mg/dL (calc) (ref <130)
Total CHOL/HDL Ratio: 2.9 (calc) (ref <5.0)
Triglycerides: 76 mg/dL (ref <150)

## 2020-08-10 ENCOUNTER — Ambulatory Visit: Payer: BC Managed Care – PPO | Admitting: Family Medicine

## 2020-08-27 ENCOUNTER — Other Ambulatory Visit: Payer: Self-pay

## 2020-08-27 ENCOUNTER — Encounter: Payer: Self-pay | Admitting: Internal Medicine

## 2020-08-27 ENCOUNTER — Ambulatory Visit: Payer: BC Managed Care – PPO | Admitting: Internal Medicine

## 2020-08-27 VITALS — BP 130/80 | HR 64 | Temp 97.8°F | Resp 16 | Ht 70.0 in | Wt 217.8 lb

## 2020-08-27 DIAGNOSIS — J310 Chronic rhinitis: Secondary | ICD-10-CM | POA: Insufficient documentation

## 2020-08-27 DIAGNOSIS — H00014 Hordeolum externum left upper eyelid: Secondary | ICD-10-CM

## 2020-08-27 DIAGNOSIS — Z23 Encounter for immunization: Secondary | ICD-10-CM

## 2020-08-27 NOTE — Progress Notes (Signed)
Patient ID: Ian Leblanc, male    DOB: 1961-07-31, 60 y.o.   MRN: [id number]  PCP: Delsa Grana, PA-C  Chief Complaint  Patient presents with  . Eye Problem  . Ear Pain    Subjective:   Ian Leblanc is a 60 y.o. male, presents to clinic with CC of the following:  Chief Complaint  Patient presents with  . Eye Problem  . Ear Pain    HPI:  Patient is a 60 year old male patient of Delsa Grana Last visit with her was for an annual exam in September 2021 Follows up today with the above complaints.  He noted over the weekend, his left eyelid darted to become more swollen and red, not markedly painful, just more uncomfortable.  His eye was a little bloodshot, although denied waking up with his eye crusted shut or any discharge from the eye.  No vision changes, no eye pain.  No photophobia.  He had some ear discomfort in association, denied being markedly painful.  He has had chronic sinus drainage, notes daily in the shower he has a lot of drainage, and has been going on for the last couple months.  Denies fevers, no marked sinus pains, no cough.  He has a spray he uses sometimes for his nasal symptoms.  He is not sure of the name.  (Likely Flonase as that is in his med list) He remains active with physical activity.  Is a runner. He notes the eyelid this morning is improved, the swelling has lessened some.   Patient Active Problem List   Diagnosis Date Noted  . Controlled type 2 diabetes mellitus with hyperglycemia, without long-term current use of insulin ([place]) 07/04/2019  . Allergic rhinitis 07/03/2019  . Cervicalgia 02/04/2015  . Diabetes mellitus type 2, uncomplicated ([place]) 82/99/3716  . HTN (hypertension) 10/10/2012  . HLD (hyperlipidemia) 10/10/2012  . History of colonic polyps 10/10/2012      Current Outpatient Medications:  .  aspirin 81 MG tablet, Take 81 mg by mouth daily., Disp: , Rfl:  .  atorvastatin (LIPITOR) 80 MG tablet, Take 1 tablet (80 mg total) by  mouth at bedtime. No further refills, needs OV/labs, Disp: 90 tablet, Rfl: 3 .  benazepril (LOTENSIN) 10 MG tablet, Take 1 tablet (10 mg total) by mouth daily., Disp: 90 tablet, Rfl: 3 .  cetirizine (ZYRTEC) 10 MG tablet, Take 1 tablet (10 mg total) by mouth daily., Disp: 30 tablet, Rfl: 11 .  fluticasone (FLONASE) 50 MCG/ACT nasal spray, Place 2 sprays into both nostrils daily., Disp: 16 g, Rfl: 6 .  levocetirizine (XYZAL) 5 MG tablet, Take 1 tablet (5 mg total) by mouth every evening., Disp: 90 tablet, Rfl: 3 .  metFORMIN (GLUCOPHAGE) 500 MG tablet, Take 1 tablet (500 mg total) by mouth daily with breakfast., Disp: 90 tablet, Rfl: 3 .  Multiple Vitamins-Minerals (MULTIVITAMIN PO), Take 1 tablet by mouth daily. Over 50 mvi qd, Disp: , Rfl:  .  naproxen (NAPROSYN) 500 MG tablet, Take 1 tablet (500 mg total) by mouth 2 (two) times daily with a meal., Disp: 30 tablet, Rfl: 0 .  Omega-3 Fatty Acids (OMEGA-3 FISH OIL PO), Take 600 mg by mouth daily. , Disp: , Rfl:  .  tadalafil (CIALIS) 5 MG tablet, Take 5 mg by mouth daily., Disp: , Rfl:    Allergies  Allergen Reactions  . Crestor [Rosuvastatin] Other (See Comments)    Severe myalgias     Past Surgical History:  Procedure Laterality Date  .  BACK SURGERY     cyst removal     History reviewed. No pertinent family history.   Social History   Tobacco Use  . Smoking status: Former Smoker    Quit date: 07/17/2003    Years since quitting: 17.1  . Smokeless tobacco: Never Used  Substance Use Topics  . Alcohol use: No    With staff assistance, above reviewed with the patient today.  ROS: As per HPI, otherwise no specific complaints on a limited and focused system review   No results found for this or any previous visit (from the past 72 hour(s)).   PHQ2/9: Depression screen [facility] 2/9 08/27/2020 05/11/2020 05/11/2020 11/05/2019 07/04/2019  Decreased Interest 0 0 0 0 0  Down, Depressed, Hopeless 0 0 0 0 0  PHQ - 2 Score 0 0 0 0 0   Altered sleeping - - - 0 0  Tired, decreased energy - - - 0 0  Change in appetite - - - 0 0  Feeling bad or failure about yourself  - - - 0 0  Trouble concentrating - - - 0 0  Moving slowly or fidgety/restless - - - 0 0  Suicidal thoughts - - - 0 0  PHQ-9 Score - - - 0 0  Difficult doing work/chores - - - Not difficult at all -   PHQ-2/9 Result is neg Fall Risk: Fall Risk  08/27/2020 05/11/2020 11/05/2019 07/04/2019 06/26/2018  Falls in the past year? 0 0 0 0 0  Number falls in past yr: 0 0 0 0 -  Injury with Fall? 0 0 0 0 -  Follow up - Falls evaluation completed - - Falls evaluation completed      Objective:   Vitals:   08/27/20 1049  BP: 130/80  Pulse: 64  Resp: 16  Temp: 97.8 F (36.6 C)  TempSrc: Oral  SpO2: 95%  Weight: 217 lb 12.8 oz (98.8 kg)  Height: 5\' 10"  (1.778 m)    Body mass index is 31.25 kg/m.  Physical Exam   NAD, masked, pleasant HEENT - [place]/AT, sclera anicteric, PERRL, EOMI with no pain on testing, conj - non-inj'ed, the left upper eyelid was mildly erythematous and swollen, more on the lateral half with a small slightly raised nodularity evident, with inverting the upper lid, a small central whitish raised area was evident with surrounding erythema and did look like was trying to come to a head, no focal sinus tenderness, nares patent, mildly erythematous, no big swollen turbinates, TM's and canals clear bilaterally, not tender tugging on the left lobe, pharynx clear Neck - supple, no adenopathy,  Car - RRR without m/g/r, not tachycardic Pulm- RR and effort normal at rest, CTA without wheeze or rales Neuro/psychiatric - affect was not flat, appropriate with conversation  Alert with normal speech   Results for orders placed or performed in visit on 05/11/20  Hemoglobin A1C  Result Value Ref Range   Hgb A1c MFr Bld 5.4 <5.7 % of total Hgb   Mean Plasma Glucose 108 (calc)   eAG (mmol/L) 6.0 (calc)  Lipid panel  Result Value Ref Range   Cholesterol  126 <200 mg/dL   HDL 43 > OR = 40 mg/dL   Triglycerides 76 <150 mg/dL   LDL Cholesterol (Calc) 67 mg/dL (calc)   Total CHOL/HDL Ratio 2.9 <5.0 (calc)   Non-HDL Cholesterol (Calc) 83 <130 mg/dL (calc)  COMPLETE METABOLIC PANEL WITH GFR  Result Value Ref Range   Glucose, Bld 74 65 - 99  mg/dL   BUN 10 7 - 25 mg/dL   Creat 0.74 0.70 - 1.25 mg/dL   GFR, Est Non African American 100 > OR = 60 mL/min/1.82m2   GFR, Est African American 115 > OR = 60 mL/min/1.49m2   BUN/Creatinine Ratio NOT APPLICABLE 6 - 22 (calc)   Sodium 140 135 - 146 mmol/L   Potassium 3.9 3.5 - 5.3 mmol/L   Chloride 105 98 - 110 mmol/L   CO2 24 20 - 32 mmol/L   Calcium 9.2 8.6 - 10.3 mg/dL   Total Protein 6.6 6.1 - 8.1 g/dL   Albumin 4.4 3.6 - 5.1 g/dL   Globulin 2.2 1.9 - 3.7 g/dL (calc)   AG Ratio 2.0 1.0 - 2.5 (calc)   Total Bilirubin 0.9 0.2 - 1.2 mg/dL   Alkaline phosphatase (APISO) 68 35 - 144 U/L   AST 26 10 - 35 U/L   ALT 37 9 - 46 U/L  CBC w/Diff/Platelet  Result Value Ref Range   WBC 6.2 3.8 - 10.8 Thousand/uL   RBC 4.96 4.20 - 5.80 Million/uL   Hemoglobin 14.9 13.2 - 17.1 g/dL   HCT 43.8 38.5 - 50.0 %   MCV 88.3 80.0 - 100.0 fL   MCH 30.0 27.0 - 33.0 pg   MCHC 34.0 32.0 - 36.0 g/dL   RDW 12.8 11.0 - 15.0 %   Platelets 137 (L) 140 - 400 Thousand/uL   MPV 12.2 7.5 - 12.5 fL   Neutro Abs 3,701 1,500 - 7,800 cells/uL   Lymphs Abs 1,755 850 - 3,900 cells/uL   Absolute Monocytes 564 200 - 950 cells/uL   Eosinophils Absolute 130 15 - 500 cells/uL   Basophils Absolute 50 0 - 200 cells/uL   Neutrophils Relative % 59.7 %   Total Lymphocyte 28.3 %   Monocytes Relative 9.1 %   Eosinophils Relative 2.1 %   Basophils Relative 0.8 %       Assessment & Plan:   1. Need for shingles vaccine He wanted to get the shingles vaccine today, and he noted his insurance does not cover it when asked, and was administered today - Varicella-zoster vaccine IM (Shingrix)  2. Hordeolum externum of left upper  eyelid Do feel he has a stye entity of his left eyelid, and educated on this today. Recommended warm compresses to the area several times daily, and do not feel additional antibiotic drops for topical creams or ointments will be helpful presently. Keep the area clean. Noted these can take some time to gradually improve  3. Chronic rhinitis Recommended a Flonase product daily to help with management   Can follow-up if symptoms not improving or more problematic over time as we discussed today.    Towanda Malkin, MD 08/27/20 10:53 AM

## 2020-08-27 NOTE — Patient Instructions (Signed)

## 2020-09-01 ENCOUNTER — Other Ambulatory Visit: Payer: BC Managed Care – PPO

## 2020-09-01 DIAGNOSIS — J349 Unspecified disorder of nose and nasal sinuses: Secondary | ICD-10-CM | POA: Diagnosis not present

## 2020-09-01 DIAGNOSIS — J019 Acute sinusitis, unspecified: Secondary | ICD-10-CM | POA: Diagnosis not present

## 2020-09-01 DIAGNOSIS — R519 Headache, unspecified: Secondary | ICD-10-CM | POA: Diagnosis not present

## 2020-09-01 DIAGNOSIS — J329 Chronic sinusitis, unspecified: Secondary | ICD-10-CM | POA: Diagnosis not present

## 2020-09-01 DIAGNOSIS — U071 COVID-19: Secondary | ICD-10-CM | POA: Diagnosis not present

## 2020-09-27 ENCOUNTER — Other Ambulatory Visit: Payer: Self-pay | Admitting: Family Medicine

## 2020-09-27 DIAGNOSIS — I10 Essential (primary) hypertension: Secondary | ICD-10-CM

## 2020-09-27 DIAGNOSIS — E1165 Type 2 diabetes mellitus with hyperglycemia: Secondary | ICD-10-CM

## 2020-10-15 ENCOUNTER — Other Ambulatory Visit: Payer: Self-pay

## 2020-10-15 ENCOUNTER — Ambulatory Visit: Payer: Self-pay | Admitting: *Deleted

## 2020-10-15 ENCOUNTER — Encounter: Payer: Self-pay | Admitting: Family Medicine

## 2020-10-15 ENCOUNTER — Ambulatory Visit: Payer: BC Managed Care – PPO | Admitting: Family Medicine

## 2020-10-15 VITALS — BP 124/72 | HR 90 | Temp 97.9°F | Resp 14 | Ht 70.0 in | Wt 221.3 lb

## 2020-10-15 DIAGNOSIS — R319 Hematuria, unspecified: Secondary | ICD-10-CM

## 2020-10-15 LAB — POCT URINALYSIS DIPSTICK
Bilirubin, UA: NEGATIVE
Glucose, UA: NEGATIVE
Ketones, UA: NEGATIVE
Leukocytes, UA: NEGATIVE
Nitrite, UA: NEGATIVE
Odor: NORMAL
Protein, UA: NEGATIVE
Spec Grav, UA: 1.025 (ref 1.010–1.025)
Urobilinogen, UA: 0.2 E.U./dL
pH, UA: 8 (ref 5.0–8.0)

## 2020-10-15 NOTE — Progress Notes (Signed)
Patient ID: Ian Leblanc, male    DOB: 06/18/1961, 60 y.o.   MRN: [id number]  PCP: Delsa Grana, PA-C  Chief Complaint  Patient presents with  . Hematuria    Subjective:   Ian Leblanc is a 60 y.o. male, presents to clinic with CC of the following:  HPI  Hematuria Onset today w/o pain, straining, urgency frequency, abd pain, flank pain  Blood clots this am, gross hematuria was darker and has got a little lighter since this am, but still has gross hematuria  A little bit of strenuous activity with pushing and pulling last night with a late night at work, no injury strain, contusion.  He does have a history of a kidney stone but he has never passed one.  He states that his appetite and fluid intake has been normal he denies any abdominal pain, pelvic pressure, recent illness or fever, testicular or scrotal pain or swelling, no low pelvic or rectal pain or pressure. He has had hematuria in the past was checked by urology without any findings and it subsequently cleared.  He does believe he had a cystoscopy (?) Last PSA was a few years ago and was normal   IPSS    Row Name 10/15/20 1410         International Prostate Symptom Score   How often have you had the sensation of not emptying your bladder? Less than 1 in 5     How often have you had to urinate less than every two hours? Less than half the time     How often have you found you stopped and started again several times when you urinated? Less than 1 in 5 times     How often have you found it difficult to postpone urination? Not at All     How often have you had a weak urinary stream? Less than 1 in 5 times     How often have you had to strain to start urination? Not at All     How many times did you typically get up at night to urinate? 1 Time     Total IPSS Score 6           Quality of Life due to urinary symptoms   If you were to spend the rest of your life with your urinary condition just the way it is now how would  you feel about that? Mostly Satisfied          Reviewed IPSS   Patient Active Problem List   Diagnosis Date Noted  . Chronic rhinitis 08/27/2020  . Controlled type 2 diabetes mellitus with hyperglycemia, without long-term current use of insulin ([place]) 07/04/2019  . Allergic rhinitis 07/03/2019  . Cervicalgia 02/04/2015  . Diabetes mellitus type 2, uncomplicated ([place]) 50/27/7412  . HTN (hypertension) 10/10/2012  . HLD (hyperlipidemia) 10/10/2012  . History of colonic polyps 10/10/2012      Current Outpatient Medications:  .  aspirin 81 MG tablet, Take 81 mg by mouth daily., Disp: , Rfl:  .  atorvastatin (LIPITOR) 80 MG tablet, Take 1 tablet (80 mg total) by mouth at bedtime. No further refills, needs OV/labs, Disp: 90 tablet, Rfl: 3 .  benazepril (LOTENSIN) 10 MG tablet, Take 1 tablet by mouth daily, Disp: 90 tablet, Rfl: 1 .  cetirizine (ZYRTEC) 10 MG tablet, Take 1 tablet (10 mg total) by mouth daily., Disp: 30 tablet, Rfl: 11 .  fluticasone (FLONASE) 50 MCG/ACT nasal spray, Place 2 sprays  into both nostrils daily., Disp: 16 g, Rfl: 6 .  levocetirizine (XYZAL) 5 MG tablet, Take 1 tablet (5 mg total) by mouth every evening., Disp: 90 tablet, Rfl: 3 .  metFORMIN (GLUCOPHAGE) 500 MG tablet, Take 1 tablet (500 mg total) by mouth daily with breakfast., Disp: 90 tablet, Rfl: 3 .  Multiple Vitamins-Minerals (MULTIVITAMIN PO), Take 1 tablet by mouth daily. Over 50 mvi qd, Disp: , Rfl:  .  naproxen (NAPROSYN) 500 MG tablet, Take 1 tablet (500 mg total) by mouth 2 (two) times daily with a meal., Disp: 30 tablet, Rfl: 0 .  Omega-3 Fatty Acids (OMEGA-3 FISH OIL PO), Take 600 mg by mouth daily. , Disp: , Rfl:  .  tadalafil (CIALIS) 5 MG tablet, Take 5 mg by mouth daily., Disp: , Rfl:    Allergies  Allergen Reactions  . Crestor [Rosuvastatin] Other (See Comments)    Severe myalgias     Social History   Tobacco Use  . Smoking status: Former Smoker    Quit date: 07/17/2003    Years  since quitting: 17.2  . Smokeless tobacco: Never Used  Vaping Use  . Vaping Use: Never used  Substance Use Topics  . Alcohol use: No  . Drug use: No      Chart Review Today: I personally reviewed active problem list, medication list, allergies, family history, social history, health maintenance, notes from last encounter, lab results, imaging with the patient/caregiver today.   Review of Systems  Constitutional: Negative.   HENT: Negative.   Eyes: Negative.   Respiratory: Negative.   Cardiovascular: Negative.   Gastrointestinal: Negative.   Endocrine: Negative.   Genitourinary: Negative.   Musculoskeletal: Negative.   Skin: Negative.   Allergic/Immunologic: Negative.   Neurological: Negative.   Hematological: Negative.   Psychiatric/Behavioral: Negative.   All other systems reviewed and are negative.      Objective:   Vitals:   10/15/20 1341  BP: 124/72  Pulse: 90  Resp: 14  Temp: 97.9 F (36.6 C)  SpO2: 97%  Weight: 221 lb 4.8 oz (100.4 kg)  Height: 5\' 10"  (1.778 m)    Body mass index is 31.75 kg/m.  Physical Exam Vitals and nursing note reviewed.  Constitutional:      General: He is not in acute distress.    Appearance: Normal appearance. He is well-developed. He is obese. He is not ill-appearing, toxic-appearing or diaphoretic.     Interventions: Face mask in place.  HENT:     Head: Normocephalic and atraumatic.     Jaw: No trismus.     Right Ear: External ear normal.     Left Ear: External ear normal.  Eyes:     General: Lids are normal. No scleral icterus.       Right eye: No discharge.        Left eye: No discharge.     Conjunctiva/sclera: Conjunctivae normal.  Neck:     Trachea: Trachea and phonation normal. No tracheal deviation.  Cardiovascular:     Rate and Rhythm: Normal rate and regular rhythm.     Pulses: Normal pulses.          Radial pulses are 2+ on the right side and 2+ on the left side.       Posterior tibial pulses are 2+ on  the right side and 2+ on the left side.     Heart sounds: Normal heart sounds. No murmur heard. No friction rub. No gallop.   Pulmonary:  Effort: Pulmonary effort is normal. No respiratory distress.     Breath sounds: Normal breath sounds. No stridor. No wheezing, rhonchi or rales.  Abdominal:     General: Bowel sounds are normal. There is no distension.     Palpations: Abdomen is soft.     Tenderness: There is no abdominal tenderness. There is no right CVA tenderness, left CVA tenderness, guarding or rebound. Negative signs include Murphy's sign and McBurney's sign.     Comments: Rectus diastases   Musculoskeletal:     Right lower leg: No edema.     Left lower leg: No edema.  Skin:    General: Skin is warm and dry.     Coloration: Skin is not jaundiced.     Findings: No rash.     Nails: There is no clubbing.  Neurological:     Mental Status: He is alert. Mental status is at baseline.     Cranial Nerves: No dysarthria or facial asymmetry.     Motor: No tremor or abnormal muscle tone.     Gait: Gait normal.  Psychiatric:        Mood and Affect: Mood normal.        Speech: Speech normal.        Behavior: Behavior normal. Behavior is cooperative.      Results for orders placed or performed in visit on 10/15/20  POCT urinalysis dipstick  Result Value Ref Range   Color, UA red    Clarity, UA clear    Glucose, UA Negative Negative   Bilirubin, UA neg    Ketones, UA neg    Spec Grav, UA 1.025 1.010 - 1.025   Blood, UA large    pH, UA 8.0 5.0 - 8.0   Protein, UA Negative Negative   Urobilinogen, UA 0.2 0.2 or 1.0 E.U./dL   Nitrite, UA neg    Leukocytes, UA Negative Negative   Appearance red    Odor normal        Assessment & Plan:     ICD-10-CM   1. Hematuria, unspecified type  R31.9 POCT urinalysis dipstick    Urinalysis, microscopic only    Urine Culture    PSA    CBC with Differential/Platelet    Urine cytology ancillary only    CANCELED: Microalbumin, urine     CANCELED: Urine Culture   painless hematuria onset this am    Patient has no other associated symptoms Abdominal exam benign No scrotal or testicular symptoms or masses No dysuria No recent illness, unlikely to have had any contusion, injury or dehydration He denies any other spontaneous bleeding or bruising  Labs to rule out infection, check prostate, blood counts and platelets I am hoping to get more info from microscopy and urine culture Patient has no risk of STDs monogamous with his wife but will do urine cytology for completion sake  If patient continues to be asymptomatic and has persistent painless hematuria will refer back to urology     Delsa Grana, PA-C 10/15/20 1:43 PM

## 2020-10-15 NOTE — Telephone Encounter (Signed)
Pt called with complaints of blood in his urine; it was first noticed 10/15/20 at 1000; he denies pain, burning, and fever; the pt says this happened before and he was seen by urology; the pt says he takes a baby aspirin daily; recommendations made per nurse triage protocol; he verbalized understanding; decision tree completed; pt offered and accepted appt with Delsa Grana, [place] on 10/15/20 at 1340; will route to office for notification.  Reason for Disposition . Blood in urine  (Exception: could be normal menstrual bleeding)  Answer Assessment - Initial Assessment Questions 1. COLOR of URINE: "Describe the color of the urine."  (e.g., tea-colored, pink, red, blood clots, bloody)     Bright red blood 2. ONSET: "When did the bleeding start?"      10/15/20 at 1000 3. EPISODES: "How many times has there been blood in the urine?" or "How many times today?"     1 4. PAIN with URINATION: "Is there any pain with passing your urine?" If Yes, ask: "How bad is the pain?"  (Scale 1-10; or mild, moderate, severe)    - MILD - complains slightly about urination hurting    - MODERATE - interferes with normal activities      - SEVERE - excruciating, unwilling or unable to urinate because of the pain      no 5. FEVER: "Do you have a fever?" If Yes, ask: "What is your temperature, how was it measured, and when did it start?"    no 6. ASSOCIATED SYMPTOMS: "Are you passing urine more frequently than usual?"     no 7. OTHER SYMPTOMS: "Do you have any other symptoms?" (e.g., back/flank pain, abdominal pain, vomiting)    no 8. PREGNANCY: "Is there any chance you are pregnant?" "When was your last menstrual period?"     n/a  Protocols used: URINE - BLOOD IN-A-AH

## 2020-10-15 NOTE — Telephone Encounter (Signed)
Appt at 1:40

## 2020-10-15 NOTE — Patient Instructions (Signed)

## 2020-10-16 LAB — CBC WITH DIFFERENTIAL/PLATELET
Absolute Monocytes: 465 cells/uL (ref 200–950)
Basophils Absolute: 62 cells/uL (ref 0–200)
Basophils Relative: 1 %
Eosinophils Absolute: 112 cells/uL (ref 15–500)
Eosinophils Relative: 1.8 %
HCT: 44.7 % (ref 38.5–50.0)
Hemoglobin: 15.2 g/dL (ref 13.2–17.1)
Lymphs Abs: 1798 cells/uL (ref 850–3900)
MCH: 29.5 pg (ref 27.0–33.0)
MCHC: 34 g/dL (ref 32.0–36.0)
MCV: 86.6 fL (ref 80.0–100.0)
MPV: 11.7 fL (ref 7.5–12.5)
Monocytes Relative: 7.5 %
Neutro Abs: 3763 cells/uL (ref 1500–7800)
Neutrophils Relative %: 60.7 %
Platelets: 181 10*3/uL (ref 140–400)
RBC: 5.16 10*6/uL (ref 4.20–5.80)
RDW: 12.6 % (ref 11.0–15.0)
Total Lymphocyte: 29 %
WBC: 6.2 10*3/uL (ref 3.8–10.8)

## 2020-10-16 LAB — URINE CULTURE
MICRO NUMBER:: 11581156
Result:: NO GROWTH
SPECIMEN QUALITY:: ADEQUATE

## 2020-10-16 LAB — URINALYSIS, MICROSCOPIC ONLY
Bacteria, UA: NONE SEEN /HPF
Hyaline Cast: NONE SEEN /LPF
Squamous Epithelial / HPF: NONE SEEN /HPF (ref <=5)
WBC, UA: NONE SEEN /HPF (ref 0–5)

## 2020-10-16 LAB — PSA: PSA: 0.95 ng/mL (ref <=4.0)

## 2020-10-24 ENCOUNTER — Other Ambulatory Visit: Payer: Self-pay | Admitting: Family Medicine

## 2020-10-24 DIAGNOSIS — R319 Hematuria, unspecified: Secondary | ICD-10-CM

## 2020-10-25 ENCOUNTER — Telehealth: Payer: Self-pay

## 2020-10-25 DIAGNOSIS — R319 Hematuria, unspecified: Secondary | ICD-10-CM

## 2020-10-25 NOTE — Telephone Encounter (Signed)
-----   Message from Steele Sizer, MD sent at 10/24/2020  9:10 PM EST ----- Labs normal except for blood in urine, we will place a referral to Urologist since urine culture was negative for infection

## 2020-11-02 ENCOUNTER — Ambulatory Visit: Payer: BC Managed Care – PPO | Admitting: Urology

## 2020-11-02 ENCOUNTER — Other Ambulatory Visit: Payer: Self-pay

## 2020-11-02 ENCOUNTER — Encounter: Payer: Self-pay | Admitting: Urology

## 2020-11-02 VITALS — BP 143/88 | HR 67 | Ht 70.0 in | Wt 217.0 lb

## 2020-11-02 DIAGNOSIS — R319 Hematuria, unspecified: Secondary | ICD-10-CM

## 2020-11-02 DIAGNOSIS — Z87442 Personal history of urinary calculi: Secondary | ICD-10-CM | POA: Diagnosis not present

## 2020-11-02 LAB — MICROSCOPIC EXAMINATION: Bacteria, UA: NONE SEEN

## 2020-11-02 LAB — URINALYSIS, COMPLETE
Bilirubin, UA: NEGATIVE
Glucose, UA: NEGATIVE
Ketones, UA: NEGATIVE
Leukocytes,UA: NEGATIVE
Nitrite, UA: NEGATIVE
Protein,UA: NEGATIVE
Specific Gravity, UA: 1.015 (ref 1.005–1.030)
Urobilinogen, Ur: 1 mg/dL (ref 0.2–1.0)
pH, UA: 7 (ref 5.0–7.5)

## 2020-11-02 NOTE — Patient Instructions (Signed)
Cystoscopy Cystoscopy is a procedure that is used to help diagnose and sometimes treat conditions that affect the lower urinary tract. The lower urinary tract includes the bladder and the urethra. The urethra is the tube that drains urine from the bladder. Cystoscopy is done using a thin, tube-shaped instrument with a light and camera at the end (cystoscope). The cystoscope may be hard or flexible, depending on the goal of the procedure. The cystoscope is inserted through the urethra, into the bladder. Cystoscopy may be recommended if you have:  Urinary tract infections that keep coming back.  Blood in the urine (hematuria).  An inability to control when you urinate (urinary incontinence) or an overactive bladder.  Unusual cells found in a urine sample.  A blockage in the urethra, such as a urinary stone.  Painful urination.  An abnormality in the bladder found during an intravenous pyelogram (IVP) or CT scan. Cystoscopy may also be done to remove a sample of tissue to be examined under a microscope (biopsy). What are the risks? Generally, this is a safe procedure. However, problems may occur, including:  Infection.  Bleeding.  What happens during the procedure?  1. You will be given one or more of the following: ? A medicine to numb the area (local anesthetic). 2. The area around the opening of your urethra will be cleaned. 3. The cystoscope will be passed through your urethra into your bladder. 4. Germ-free (sterile) fluid will flow through the cystoscope to fill your bladder. The fluid will stretch your bladder so that your health care provider can clearly examine your bladder walls. 5. Your doctor will look at the urethra and bladder. 6. The cystoscope will be removed The procedure may vary among health care providers  What can I expect after the procedure? After the procedure, it is common to have: 1. Some soreness or pain in your abdomen and urethra. 2. Urinary symptoms.  These include: ? Mild pain or burning when you urinate. Pain should stop within a few minutes after you urinate. This may last for up to 1 week. ? A small amount of blood in your urine for several days. ? Feeling like you need to urinate but producing only a small amount of urine. Follow these instructions at home: General instructions  Return to your normal activities as told by your health care provider.   Do not drive for 24 hours if you were given a sedative during your procedure.  Watch for any blood in your urine. If the amount of blood in your urine increases, call your health care provider.  If a tissue sample was removed for testing (biopsy) during your procedure, it is up to you to get your test results. Ask your health care provider, or the department that is doing the test, when your results will be ready.  Drink enough fluid to keep your urine pale yellow.  Keep all follow-up visits as told by your health care provider. This is important. Contact a health care provider if you:  Have pain that gets worse or does not get better with medicine, especially pain when you urinate.  Have trouble urinating.  Have more blood in your urine. Get help right away if you:  Have blood clots in your urine.  Have abdominal pain.  Have a fever or chills.  Are unable to urinate. Summary  Cystoscopy is a procedure that is used to help diagnose and sometimes treat conditions that affect the lower urinary tract.  Cystoscopy is done using   a thin, tube-shaped instrument with a light and camera at the end.  After the procedure, it is common to have some soreness or pain in your abdomen and urethra.  Watch for any blood in your urine. If the amount of blood in your urine increases, call your health care provider.  If you were prescribed an antibiotic medicine, take it as told by your health care provider. Do not stop taking the antibiotic even if you start to feel better. This  information is not intended to replace advice given to you by your health care provider. Make sure you discuss any questions you have with your health care provider. Document Revised: 07/30/2018 Document Reviewed: 07/30/2018 [employer]vier Patient Education  2020 [employer]   

## 2020-11-02 NOTE — Progress Notes (Signed)
11/02/2020 3:41 PM   Ian Leblanc 1960-12-28 [id number]  Referring provider: Steele Sizer, MD [address] [place],  [place] [address]  Chief Complaint  Patient presents with  . Hematuria    HPI: 60 year old male who presents today for further evaluation of gross hematuria.  He presented to his primary care physicians in late February with acute onset gross hematuria with small clots.  And ultimately cleared.  He denies any associated symptoms including flank pain.  Urinalysis checked by his PCP on 10/15/2020 showed 10-20 red blood cells per high-powered field in the absence of infection.  Ultimately, his bleeding resolved spontaneously within a day.  He does have a personal history of kidney stones.  He spontaneously passed a stone over 10 years ago.  It required no further intervention.  He has not had a stone since.  He is not having any flank pain.  He does have a personal history of gross hematuria.  He believes he underwent work-up at [facility] about 6 years ago for an episode of gross hematuria which was not as severe as this.  He does remember whether or not a CT scan was done.  He does recall having a cystoscopy.  He had a subsequent episode about 2 years later but none since.  Most recent PSA 0.95 on 09/2020.  He does have some mild baseline urinary symptoms including urinary frequency, weak stream.  He is not currently on any BPH medications.  He does have a family history of prostate cancer.  He is a former smoker.  He recalls smoking less than a pack a day for about a decade in the 90s.  PMH: Past Medical History:  Diagnosis Date  . Abnormal EKG 07/16/2013  . Cervical spine disease 02/04/2015  . Chest pain 07/16/2013  . Hyperglycemia   . Hyperlipidemia   . Hypertension   . Neck pain on left side 02/04/2015  . Numbness of fingers 02/04/2015  . Paresthesia of left arm 02/04/2015  . Screening for prostate cancer 12/09/2014  . Sessile colonic  polyp     Surgical History: Past Surgical History:  Procedure Laterality Date  . BACK SURGERY     cyst removal    Home Medications:  Allergies as of 11/02/2020      Reactions   Crestor [rosuvastatin] Other (See Comments)   Severe myalgias      Medication List       Accurate as of November 02, 2020  3:41 PM. If you have any questions, ask your nurse or doctor.        aspirin 81 MG tablet Take 81 mg by mouth daily.   atorvastatin 80 MG tablet Commonly known as: LIPITOR Take 1 tablet (80 mg total) by mouth at bedtime. No further refills, needs OV/labs   benazepril 10 MG tablet Commonly known as: LOTENSIN Take 1 tablet by mouth daily   cetirizine 10 MG tablet Commonly known as: ZYRTEC Take 1 tablet (10 mg total) by mouth daily.   fluticasone 50 MCG/ACT nasal spray Commonly known as: FLONASE Place 2 sprays into both nostrils daily.   levocetirizine 5 MG tablet Commonly known as: XYZAL Take 1 tablet (5 mg total) by mouth every evening.   metFORMIN 500 MG tablet Commonly known as: GLUCOPHAGE Take 1 tablet (500 mg total) by mouth daily with breakfast.   MULTIVITAMIN PO Take 1 tablet by mouth daily. Over 50 mvi qd   naproxen 500 MG tablet Commonly known as: Naprosyn Take 1 tablet (  500 mg total) by mouth 2 (two) times daily with a meal.   OMEGA-3 FISH OIL PO Take 600 mg by mouth daily.   tadalafil 5 MG tablet Commonly known as: CIALIS Take 5 mg by mouth daily.       Allergies:  Allergies  Allergen Reactions  . Crestor [Rosuvastatin] Other (See Comments)    Severe myalgias    Family History: Family History  Problem Relation Age of Onset  . Prostate cancer Paternal Grandfather     Social History:  reports that he quit smoking about 17 years ago. He has never used smokeless tobacco. He reports that he does not drink alcohol and does not use drugs.   Physical Exam: BP (!) 143/88   Pulse 67   Ht 5\' 10"  (1.778 m)   Wt 217 lb (98.4 kg)   BMI 31.14  kg/m   Constitutional:  Alert and oriented, No acute distress. HEENT: [place] AT, moist mucus membranes.  Trachea midline, no masses. Cardiovascular: No clubbing, cyanosis, or edema. Respiratory: Normal respiratory effort, no increased work of breathing. GI: Abdomen is soft, nontender, nondistended, no abdominal masses Skin: No rashes, bruises or suspicious lesions. Neurologic: Grossly intact, no focal deficits, moving all 4 extremities. Psychiatric: Normal mood and affect.  Laboratory Data: Lab Results  Component Value Date   WBC 6.2 10/15/2020   HGB 15.2 10/15/2020   HCT 44.7 10/15/2020   MCV 86.6 10/15/2020   PLT 181 10/15/2020    Lab Results  Component Value Date   CREATININE 0.74 05/11/2020    Lab Results  Component Value Date   PSA 0.95 10/15/2020   PSA 1.0 05/23/2017   PSA 0.88 12/16/2015    Lab Results  Component Value Date   HGBA1C 5.4 05/11/2020    Urinalysis Urinalysis today is unremarkable, no microscopic blood     Assessment & Plan:    1. Hematuria, unspecified type We discussed the differential diagnosis for gross hematuria including nephrolithiasis, renal or upper tract tumors, bladder stones, UTIs, or bladder tumors as well as undetermined etiologies.  Based on his age and degree of hematuria, he falls into the high risk category.  Per AUA guidelines, I did recommend complete microscopic hematuria evaluation including CTU, possible urine cytology, and office cystoscopy. - Urinalysis, Complete - CT HEMATURIA WORKUP; Future  2. History of kidney stones Remote personal history of kidney stones but no recent flank pain, unlikely to represent a stone episode although will assess with CT urogram as outlined above   Hollice Espy, MD  [place] [address] [place], [place] [address] [phone]

## 2020-11-09 ENCOUNTER — Encounter: Payer: Self-pay | Admitting: Family Medicine

## 2020-11-09 ENCOUNTER — Other Ambulatory Visit: Payer: Self-pay

## 2020-11-09 ENCOUNTER — Ambulatory Visit: Payer: BC Managed Care – PPO | Admitting: Family Medicine

## 2020-11-09 VITALS — BP 138/88 | HR 65 | Temp 97.9°F | Resp 18 | Ht 70.0 in | Wt 220.5 lb

## 2020-11-09 DIAGNOSIS — E1165 Type 2 diabetes mellitus with hyperglycemia: Secondary | ICD-10-CM | POA: Diagnosis not present

## 2020-11-09 DIAGNOSIS — I10 Essential (primary) hypertension: Secondary | ICD-10-CM

## 2020-11-09 DIAGNOSIS — Z23 Encounter for immunization: Secondary | ICD-10-CM

## 2020-11-09 DIAGNOSIS — Z6830 Body mass index (BMI) 30.0-30.9, adult: Secondary | ICD-10-CM

## 2020-11-09 DIAGNOSIS — E66811 Obesity, class 1: Secondary | ICD-10-CM

## 2020-11-09 DIAGNOSIS — E782 Mixed hyperlipidemia: Secondary | ICD-10-CM | POA: Diagnosis not present

## 2020-11-09 DIAGNOSIS — E669 Obesity, unspecified: Secondary | ICD-10-CM

## 2020-11-09 DIAGNOSIS — Z09 Encounter for follow-up examination after completed treatment for conditions other than malignant neoplasm: Secondary | ICD-10-CM | POA: Diagnosis not present

## 2020-11-09 DIAGNOSIS — Z5181 Encounter for therapeutic drug level monitoring: Secondary | ICD-10-CM

## 2020-11-09 DIAGNOSIS — J309 Allergic rhinitis, unspecified: Secondary | ICD-10-CM

## 2020-11-09 LAB — POCT GLYCOSYLATED HEMOGLOBIN (HGB A1C): Hemoglobin A1C: 5.3 % (ref 4.0–5.6)

## 2020-11-09 MED ORDER — ATORVASTATIN CALCIUM 80 MG PO TABS: 80.0000 mg | ORAL_TABLET | Freq: Every day | ORAL | 3 refills | Status: DC

## 2020-11-09 MED ORDER — LEVOCETIRIZINE DIHYDROCHLORIDE 5 MG PO TABS: 5.0000 mg | ORAL_TABLET | Freq: Every evening | ORAL | 3 refills | Status: DC

## 2020-11-09 MED ORDER — BENAZEPRIL HCL 10 MG PO TABS: 10.0000 mg | ORAL_TABLET | Freq: Every day | ORAL | 3 refills | Status: DC

## 2020-11-09 MED ORDER — FLUTICASONE PROPIONATE 50 MCG/ACT NA SUSP: 2.0000 | Freq: Every day | NASAL | 6 refills | Status: AC

## 2020-11-09 NOTE — Progress Notes (Signed)
Name: Ian Leblanc   MRN: [id number]    DOB: [birth year]   Date:11/09/2020       Progress Note  Chief Complaint  Patient presents with  . Hypertension  . Diabetes    6 month follow up     Subjective:   Ian Leblanc is a 60 y.o. male, presents to clinic for routine follow-up  Hypertension managed with benazepril 10 mg, good compliance, he does state that he needs to work on diet lifestyle efforts he is not currently running as much as he used to -blood pressure is slightly higher than goal BP Readings from Last 3 Encounters:  11/09/20 138/88  11/02/20 (!) 143/88  10/15/20 124/72   Hyperlipidemia: Currently treated with Lipitor 80 mg, pt reports good med compliance Last Lipids: Lab Results  Component Value Date   CHOL 126 05/11/2020   HDL 43 05/11/2020   LDLCALC 67 05/11/2020   TRIG 76 05/11/2020   CHOLHDL 2.9 05/11/2020   - Denies: Chest pain, shortness of breath, myalgias, claudication   DM:   Pt managing DM with metformin Reports good med compliance Pt has no SE from meds. Blood sugars -not checking Denies: Polyuria, polydipsia, vision changes, neuropathy, hypoglycemia Recent pertinent labs: Lab Results  Component Value Date   HGBA1C 5.3 11/09/2020   HGBA1C 5.4 05/11/2020   HGBA1C 5.3 11/05/2019   Standard of care and health maintenance: Foot exam: Done DM eye exam: Due ACEI/ARB: Yes Statin: Yes  Allergic rhinitis-allergy starting to flareup not yet on medicines daily     Current Outpatient Medications:  .  aspirin 81 MG tablet, Take 81 mg by mouth daily., Disp: , Rfl:  .  atorvastatin (LIPITOR) 80 MG tablet, Take 1 tablet (80 mg total) by mouth at bedtime. No further refills, needs OV/labs, Disp: 90 tablet, Rfl: 3 .  benazepril (LOTENSIN) 10 MG tablet, Take 1 tablet by mouth daily, Disp: 90 tablet, Rfl: 1 .  cetirizine (ZYRTEC) 10 MG tablet, Take 1 tablet (10 mg total) by mouth daily., Disp: 30 tablet, Rfl: 11 .  fluticasone (FLONASE) 50 MCG/ACT  nasal spray, Place 2 sprays into both nostrils daily., Disp: 16 g, Rfl: 6 .  levocetirizine (XYZAL) 5 MG tablet, Take 1 tablet (5 mg total) by mouth every evening., Disp: 90 tablet, Rfl: 3 .  metFORMIN (GLUCOPHAGE) 500 MG tablet, Take 1 tablet (500 mg total) by mouth daily with breakfast., Disp: 90 tablet, Rfl: 3 .  Multiple Vitamins-Minerals (MULTIVITAMIN PO), Take 1 tablet by mouth daily. Over 50 mvi qd, Disp: , Rfl:  .  naproxen (NAPROSYN) 500 MG tablet, Take 1 tablet (500 mg total) by mouth 2 (two) times daily with a meal., Disp: 30 tablet, Rfl: 0 .  Omega-3 Fatty Acids (OMEGA-3 FISH OIL PO), Take 600 mg by mouth daily. , Disp: , Rfl:  .  tadalafil (CIALIS) 5 MG tablet, Take 5 mg by mouth daily., Disp: , Rfl:   Patient Active Problem List   Diagnosis Date Noted  . Chronic rhinitis 08/27/2020  . Controlled type 2 diabetes mellitus with hyperglycemia, without long-term current use of insulin ([place]) 07/04/2019  . Allergic rhinitis 07/03/2019  . Cervicalgia 02/04/2015  . Diabetes mellitus type 2, uncomplicated ([place]) 35/57/3220  . HTN (hypertension) 10/10/2012  . HLD (hyperlipidemia) 10/10/2012  . History of colonic polyps 10/10/2012    Past Surgical History:  Procedure Laterality Date  . BACK SURGERY     cyst removal    Family History  Problem Relation Age of Onset  .  Prostate cancer Paternal Grandfather     Social History   Tobacco Use  . Smoking status: Former Smoker    Quit date: 07/17/2003    Years since quitting: 17.3  . Smokeless tobacco: Never Used  Vaping Use  . Vaping Use: Never used  Substance Use Topics  . Alcohol use: No  . Drug use: No     Allergies  Allergen Reactions  . Crestor [Rosuvastatin] Other (See Comments)    Severe myalgias    Health Maintenance  Topic Date Due  . COVID-19 Vaccine (3 - Booster for Pfizer series) 05/25/2020  . OPHTHALMOLOGY EXAM  10/19/2020  . HEMOGLOBIN A1C  11/08/2020  . TETANUS/TDAP  03/23/2021  . FOOT EXAM  05/11/2021   . COLONOSCOPY (Pts 45-31yrs Insurance coverage will need to be confirmed)  08/01/2028  . INFLUENZA VACCINE  Completed  . PNEUMOCOCCAL POLYSACCHARIDE VACCINE AGE 26-64 HIGH RISK  Completed  . Hepatitis C Screening  Completed  . HIV Screening  Completed  . HPV VACCINES  Aged Out    Chart Review Today: I personally reviewed active problem list, medication list, allergies, family history, social history, health maintenance, notes from last encounter, lab results, imaging with the patient/caregiver today.   Review of Systems  Constitutional: Negative.   HENT: Negative.   Eyes: Negative.   Respiratory: Negative.   Cardiovascular: Negative.   Gastrointestinal: Negative.   Endocrine: Negative.   Genitourinary: Negative.   Musculoskeletal: Negative.   Skin: Negative.   Allergic/Immunologic: Negative.   Neurological: Negative.   Hematological: Negative.   Psychiatric/Behavioral: Negative.   All other systems reviewed and are negative.    Objective:   Vitals:   11/09/20 0819  BP: 138/88  Pulse: 65  Resp: 18  Temp: 97.9 F (36.6 C)  TempSrc: Oral  SpO2: 98%  Weight: 220 lb 8 oz (100 kg)  Height: 5\' 10"  (1.778 m)    Body mass index is 31.64 kg/m.  Physical Exam Vitals and nursing note reviewed.  Constitutional:      General: He is not in acute distress.    Appearance: Normal appearance. He is well-developed. He is obese. He is not ill-appearing, toxic-appearing or diaphoretic.     Interventions: Face mask in place.  HENT:     Head: Normocephalic and atraumatic.     Jaw: No trismus.     Right Ear: External ear normal.     Left Ear: External ear normal.  Eyes:     General: Lids are normal. No scleral icterus.       Right eye: No discharge.        Left eye: No discharge.     Conjunctiva/sclera: Conjunctivae normal.  Neck:     Trachea: Trachea and phonation normal. No tracheal deviation.  Cardiovascular:     Rate and Rhythm: Normal rate and regular rhythm.      Pulses: Normal pulses.          Radial pulses are 2+ on the right side and 2+ on the left side.       Posterior tibial pulses are 2+ on the right side and 2+ on the left side.     Heart sounds: Normal heart sounds. No murmur heard. No friction rub. No gallop.   Pulmonary:     Effort: Pulmonary effort is normal. No respiratory distress.     Breath sounds: Normal breath sounds. No stridor. No wheezing, rhonchi or rales.  Abdominal:     General: Bowel sounds are normal. There is  no distension.     Palpations: Abdomen is soft.  Musculoskeletal:     Right lower leg: No edema.     Left lower leg: No edema.  Skin:    General: Skin is warm and dry.     Coloration: Skin is not jaundiced.     Findings: No rash.     Nails: There is no clubbing.  Neurological:     Mental Status: He is alert. Mental status is at baseline.     Cranial Nerves: No dysarthria or facial asymmetry.     Motor: No tremor or abnormal muscle tone.     Gait: Gait normal.  Psychiatric:        Mood and Affect: Mood normal.        Speech: Speech normal.        Behavior: Behavior normal. Behavior is cooperative.         Assessment & Plan:     ICD-10-CM   1. Controlled type 2 diabetes mellitus with hyperglycemia, without long-term current use of insulin (HCC)  E11.65 POCT glycosylated hemoglobin (Hb A1C)    benazepril (LOTENSIN) 10 MG tablet   Has been stable and well-controlled on Metformin and with diet and lifestyle efforts  2. Need for shingles vaccine  Z23 Varicella-zoster vaccine IM (Shingrix)  3. Mixed hyperlipidemia  E78.2 atorvastatin (LIPITOR) 80 MG tablet   Good statin compliance, no myalgias side effects or concerns  4. Hypertension, unspecified type  I10 benazepril (LOTENSIN) 10 MG tablet   Blood pressure is elevated a little bit above goal for this patient, encouraged him to work on diet lifestyle efforts, monitor BP  5. Class 1 obesity with serious comorbidity and body mass index (BMI) of 30.0 to 30.9  in adult, unspecified obesity type  E66.9    Z68.30   6. Encounter for medication monitoring  Z51.81   7. Mixed hyperlipidemia  E78.2 atorvastatin (LIPITOR) 80 MG tablet   has been well controlled, still tolerating meds w/o SE or concerns  8. Controlled type 2 diabetes mellitus with hyperglycemia, without long-term current use of insulin (HCC)  E11.65 POCT glycosylated hemoglobin (Hb A1C)    benazepril (LOTENSIN) 10 MG tablet   Well-controlled diabetes he has been able to decrease his Metformin dose and remain controlled, continue to work on healthy diet  9. Hypertension, unspecified type  I10 benazepril (LOTENSIN) 10 MG tablet   Stable, well-controlled hypertension on benazepril continue DASH diet and other lifestyle efforts  10. Allergic rhinitis, unspecified seasonality, unspecified trigger  J30.9 levocetirizine (XYZAL) 5 MG tablet    fluticasone (FLONASE) 50 MCG/ACT nasal spray   worse with fall weather, d/c zyrtec/claritin and try xyzal + flonase  11. Need for immunization follow-up  Z09 Varicella-zoster vaccine IM (Shingrix)     Return in about 6 months (around 05/12/2021) for Routine follow-up, Annual Physical split ok.   Delsa Grana, PA-C 11/09/20 8:39 AM

## 2020-11-09 NOTE — Patient Instructions (Signed)
Get you diabetic eye exam done Health Maintenance  Topic Date Due  . COVID-19 Vaccine (3 - Booster for Pfizer series) 05/25/2020  . Eye exam for diabetics  10/19/2020  . Hemoglobin A1C  11/08/2020  . Tetanus Vaccine  03/23/2021  . Complete foot exam   05/11/2021  . Colon Cancer Screening  08/01/2028  . Flu Shot  Completed  . Pneumococcal vaccine  Completed  .  Hepatitis C: One time screening is recommended by Center for Disease Control  (CDC) for  adults born from [birth year] through 1967.   Completed  . HIV Screening  Completed  . HPV Vaccine  Aged Out

## 2020-12-01 ENCOUNTER — Ambulatory Visit
Admission: RE | Admit: 2020-12-01 | Discharge: 2020-12-01 | Disposition: A | Discharge status: Home / Self Care | Payer: BC Managed Care – PPO | Source: Ambulatory Visit | Attending: Urology | Admitting: Urology

## 2020-12-01 ENCOUNTER — Other Ambulatory Visit: Payer: Self-pay

## 2020-12-01 DIAGNOSIS — R310 Gross hematuria: Secondary | ICD-10-CM | POA: Diagnosis not present

## 2020-12-01 DIAGNOSIS — R319 Hematuria, unspecified: Secondary | ICD-10-CM | POA: Diagnosis not present

## 2020-12-01 DIAGNOSIS — N281 Cyst of kidney, acquired: Secondary | ICD-10-CM | POA: Diagnosis not present

## 2020-12-01 DIAGNOSIS — K439 Ventral hernia without obstruction or gangrene: Secondary | ICD-10-CM | POA: Diagnosis not present

## 2020-12-01 DIAGNOSIS — K429 Umbilical hernia without obstruction or gangrene: Secondary | ICD-10-CM | POA: Diagnosis not present

## 2020-12-01 HISTORY — DX: Type 2 diabetes mellitus without complications: E11.9

## 2020-12-01 LAB — POCT I-STAT CREATININE: Creatinine, Ser: 0.9 mg/dL (ref 0.61–1.24)

## 2020-12-01 MED ORDER — IOHEXOL 300 MG/ML  SOLN
125.0000 mL | Freq: Once | INTRAMUSCULAR | Status: AC | PRN
  Administered 2020-12-01: 125 mL via INTRAVENOUS

## 2020-12-07 ENCOUNTER — Ambulatory Visit: Payer: BC Managed Care – PPO | Admitting: Urology

## 2020-12-07 ENCOUNTER — Other Ambulatory Visit: Payer: Self-pay

## 2020-12-07 VITALS — BP 168/91 | HR 66

## 2020-12-07 DIAGNOSIS — C672 Malignant neoplasm of lateral wall of bladder: Secondary | ICD-10-CM

## 2020-12-07 DIAGNOSIS — C670 Malignant neoplasm of trigone of bladder: Secondary | ICD-10-CM

## 2020-12-07 DIAGNOSIS — R319 Hematuria, unspecified: Secondary | ICD-10-CM | POA: Diagnosis not present

## 2020-12-07 NOTE — Patient Instructions (Signed)
Transurethral Resection of Bladder Tumor, Care After This sheet gives you information about how to care for yourself after your procedure. Your health care provider may also give you more specific instructions. If you have problems or questions, contact your health care provider. What can I expect after the procedure? After the procedure, it is common to have:  A small amount of blood in your urine for up to 2 weeks.  Soreness or mild pain from your catheter. After your catheter is removed, you may have mild soreness, especially when urinating.  Pain in your lower abdomen. Follow these instructions at home: Medicines  Take over-the-counter and prescription medicines only as told by your health care provider.  If you were prescribed an antibiotic medicine, take it as told by your health care provider. Do not stop taking the antibiotic even if you start to feel better.  Do not drive for 24 hours if you were given a sedative during your procedure.  Ask your health care provider if the medicine prescribed to you: ? Requires you to avoid driving or using heavy machinery. ? Can cause constipation. You may need to take these actions to prevent or treat constipation:  Take over-the-counter or prescription medicines.  Eat foods that are high in fiber, such as beans, whole grains, and fresh fruits and vegetables.  Limit foods that are high in fat and processed sugars, such as fried or sweet foods.   Activity  Return to your normal activities as told by your health care provider. Ask your health care provider what activities are safe for you.  Do not lift anything that is heavier than 10 lb (4.5 kg), or the limit that you are told, until your health care provider says that it is safe.  Avoid intense physical activity for as long as told by your health care provider.  Rest as told by your health care provider.  Avoid sitting for a long time without moving. Get up to take short walks every  1-2 hours. This is important to improve blood flow and breathing. Ask for help if you feel weak or unsteady. General instructions  Do not drink alcohol for as long as told by your health care provider. This is especially important if you are taking prescription pain medicines.  Do not take baths, swim, or use a hot tub until your health care provider approves. Ask your health care provider if you may take showers. You may only be allowed to take sponge baths.  If you have a catheter, follow instructions from your health care provider about caring for your catheter and your drainage bag.  Drink enough fluid to keep your urine pale yellow.  Wear compression stockings as told by your health care provider. These stockings help to prevent blood clots and reduce swelling in your legs.  Keep all follow-up visits as told by your health care provider. This is important. ? You will need to be followed closely with regular checks of your bladder and urethra (cystoscopies) to make sure that the cancer does not come back.   Contact a health care provider if:  You have pain that gets worse or does not improve with medicine.  You have blood in your urine for more than 2 weeks.  You have cloudy or bad-smelling urine.  You become constipated. Signs of constipation may include having: ? Fewer than three bowel movements in a week. ? Difficulty having a bowel movement. ? Stools that are dry, hard, or larger than normal.  You have a fever. Get help right away if:  You have: ? Severe pain. ? Bright red blood in your urine. ? Blood clots in your urine. ? A lot of blood in your urine.  Your catheter has been removed and you are not able to urinate.  You have a catheter in place and the catheter is not draining urine. Summary  After your procedure, it is common to have a small amount of blood in your urine, soreness or mild pain from your catheter, and pain in your lower abdomen.  Take  over-the-counter and prescription medicines only as told by your health care provider.  Rest as told by your health care provider. Follow your health care provider's instructions about returning to normal activities. Ask what activities are safe for you.  If you have a catheter, follow instructions from your health care provider about caring for your catheter and your drainage bag.  Get help right away if you cannot urinate, you have severe pain, or you have bright red blood or blood clots in your urine. This information is not intended to replace advice given to you by your health care provider. Make sure you discuss any questions you have with your health care provider. Document Revised: 03/07/2018 Document Reviewed: 03/07/2018 Elsevier Patient Education  [place].

## 2020-12-07 NOTE — Progress Notes (Signed)
   12/07/20  CC:  Chief Complaint  Patient presents with  . Cysto    HPI: 60 year old male with gross hematuria who presents today for follow-up of CT urogram and cystoscopy.  CT urogram completed on 12/02/2020 does show 1 small area within the bladder, several millimeters in size near the dome which may recheck present filling defect.  Otherwise, no additional upper tract pathology identified.  Small incidental pulmonary nodule felt to be likely benign.  Vitals:   12/07/20 1550  BP: (!) 168/91  Pulse: 66   NED. A&Ox3.   No respiratory distress   Abd soft, NT, ND Normal phallus with bilateral descended testicles  Cystoscopy Procedure Note  Patient identification was confirmed, informed consent was obtained, and patient was prepped using Betadine solution.  Lidocaine jelly was administered per urethral meatus.     Pre-Procedure: - Inspection reveals a normal caliber ureteral meatus.  Procedure: The flexible cystoscope was introduced without difficulty - No urethral strictures/lesions are present. - Normal prostate  - Normal bladder neck with a small papillary 0.5 cm tumor on the right lateral bladder neck adjacent to the right UO - Bilateral ureteral orifices identified - Bladder mucosa  reveals at least 7 additional tumors on the left lateral bladder wall, confluence measuring up to about 2 cm with somewhat low-grade superficial appearance - No bladder stones - No trabeculation  Retroflexion shows above tumor bladder neck adjacent to the right UO   Post-Procedure: - Patient tolerated the procedure well  Assessment/ Plan:  1. Malignant neoplasm of lateral wall of urinary bladder (HCC) Multifocal bladder cancer  Discussed proceeding to the operating room for TURBT (medium), possible right ureteral stent based on the proximity to the right UO if resection is deemed necessary.  We also discussed intravesical gemcitabine along with risk and benefits of this.  We  discussed the risk of bleeding, infection, damage chronic structures amongst others.  All questions were answered.  He is agreeable to proceed as planned. - Urinalysis, Complete - CULTURE, URINE COMPREHENSIVE  2. Malignant neoplasm of trigone of urinary bladder (HCC) As above  Hollice Espy, MD

## 2020-12-08 ENCOUNTER — Other Ambulatory Visit: Payer: Self-pay | Admitting: Family Medicine

## 2020-12-08 DIAGNOSIS — E782 Mixed hyperlipidemia: Secondary | ICD-10-CM

## 2020-12-08 DIAGNOSIS — H35033 Hypertensive retinopathy, bilateral: Secondary | ICD-10-CM | POA: Diagnosis not present

## 2020-12-08 DIAGNOSIS — E119 Type 2 diabetes mellitus without complications: Secondary | ICD-10-CM | POA: Diagnosis not present

## 2020-12-08 DIAGNOSIS — H2513 Age-related nuclear cataract, bilateral: Secondary | ICD-10-CM | POA: Diagnosis not present

## 2020-12-09 DIAGNOSIS — R0981 Nasal congestion: Secondary | ICD-10-CM | POA: Diagnosis not present

## 2020-12-09 DIAGNOSIS — J0100 Acute maxillary sinusitis, unspecified: Secondary | ICD-10-CM | POA: Diagnosis not present

## 2020-12-09 DIAGNOSIS — I10 Essential (primary) hypertension: Secondary | ICD-10-CM | POA: Diagnosis not present

## 2020-12-09 DIAGNOSIS — R0982 Postnasal drip: Secondary | ICD-10-CM | POA: Diagnosis not present

## 2020-12-09 LAB — URINALYSIS, COMPLETE
Bilirubin, UA: NEGATIVE
Glucose, UA: NEGATIVE
Ketones, UA: NEGATIVE
Leukocytes,UA: NEGATIVE
Nitrite, UA: NEGATIVE
Protein,UA: NEGATIVE
Specific Gravity, UA: 1.02 (ref 1.005–1.030)
Urobilinogen, Ur: 1 mg/dL (ref 0.2–1.0)
pH, UA: 7 (ref 5.0–7.5)

## 2020-12-09 LAB — MICROSCOPIC EXAMINATION: Bacteria, UA: NONE SEEN

## 2020-12-11 LAB — CULTURE, URINE COMPREHENSIVE

## 2020-12-13 ENCOUNTER — Other Ambulatory Visit: Payer: Self-pay | Admitting: Urology

## 2020-12-13 ENCOUNTER — Telehealth: Payer: Self-pay | Admitting: *Deleted

## 2020-12-13 DIAGNOSIS — C672 Malignant neoplasm of lateral wall of bladder: Secondary | ICD-10-CM

## 2020-12-13 MED ORDER — NITROFURANTOIN MONOHYD MACRO 100 MG PO CAPS: 100.0000 mg | ORAL_CAPSULE | Freq: Two times a day (BID) | ORAL | 0 refills | Status: DC

## 2020-12-13 NOTE — Telephone Encounter (Addendum)
Patient advised, RX sent in to [facility] as requested. Voiced understanding.   ----- Message from Hollice Espy, MD sent at 12/13/2020 12:57 PM EDT ----- Please treat positive preop culture with macrobid 100 mg bid x 10 days  Hollice Espy, MD

## 2020-12-31 ENCOUNTER — Other Ambulatory Visit: Payer: Self-pay

## 2020-12-31 ENCOUNTER — Encounter
Admission: RE | Admit: 2020-12-31 | Discharge: 2020-12-31 | Disposition: A | Discharge status: Home / Self Care | Payer: BC Managed Care – PPO | Source: Ambulatory Visit | Attending: Urology | Admitting: Urology

## 2020-12-31 DIAGNOSIS — I10 Essential (primary) hypertension: Secondary | ICD-10-CM | POA: Diagnosis not present

## 2020-12-31 HISTORY — DX: Malignant (primary) neoplasm, unspecified: C80.1

## 2020-12-31 HISTORY — DX: Unspecified osteoarthritis, unspecified site: M19.90

## 2020-12-31 HISTORY — DX: Personal history of urinary calculi: Z87.442

## 2020-12-31 NOTE — Patient Instructions (Signed)
INSTRUCTIONS FOR SURGERY     Your surgery is scheduled for:   Monday, MAY 23RD     To find out your arrival time for the day of surgery,          please call [phone] between 1 pm and 3 pm on :  Friday, MAY 20TH     When you arrive for surgery, report to the [facility]. ONCE YOU  HAVE FINISHED WITH REGISTRATION, PLEASE GO TO THE SECOND FLOOR AND CHECK IN AT THE SURGERY DESK.    REMEMBER: Instructions that are not followed completely may result in serious medical risk,  up to and including death, or upon the discretion of your surgeon and anesthesiologist,            your surgery may need to be rescheduled.  __X__ 1. Do not eat food after midnight the night before your procedure.                    No gum, candy, lozenger, tic tacs, tums or hard candies.                  ABSOLUTELY NOTHING SOLID IN YOUR MOUTH AFTER MIDNIGHT                    You may drink unlimited clear liquids up to 2 hours before you are scheduled to arrive for surgery.                   Do not drink anything within those 2 hours unless you need to take medicine, then take the                   smallest amount you need.  Clear liquids include:  water, apple juice without pulp,                   any flavor Gatorade, Black coffee, black tea.  Sugar may be added but no dairy/ honey /lemon.                        Broth and jello is not considered a clear liquid.  __x__  2. On the morning of surgery, please brush your teeth with toothpaste and water. You may rinse with                  mouthwash if you wish but DO NOT SWALLOW TOOTHPASTE OR MOUTHWASH  __X___3. NO alcohol for 24 hours before or after surgery.  __x___ 4.  Do NOT smoke or use e-cigarettes for 24 HOURS PRIOR TO SURGERY.                      DO NOT Use any chewable tobacco products for at least 6 hours prior to surgery.  __x___ 5. If you start any new medication after this  appointment and prior to surgery, please                   Bring it with you on the day of surgery.  ___x__ 6. Notify your doctor if  there is any change in your medical condition, such as fever, infection, vomitting,                   Diarrhea or any open sores.  __x___ 7.  USE the CHG SOAP as instructed, the night before surgery and the day of surgery.                   Once you have washed with this soap, do NOT use any of the following: Powders, perfumes                    or lotions. Please do not wear make up, hairpins, clips or nail polish. You may wear deodorant.                   Men may shave their face and neck.                     DO NOT wear ANY jewelry on the day of surgery. If there are rings that are too tight to                    remove easily, please address this prior to the surgery day. Piercings need to be removed.                                                                     NO METAL ON YOUR BODY.                    Do NOT bring any valuables.  If you came to Pre-Admit testing then you will not need license,                     insurance card or credit card.  If you will be staying overnight, please either leave your things in                     the car or have your family be responsible for these items.                     [facility] IS NOT RESPONSIBLE FOR BELONGINGS OR VALUABLES.  ___X__ 8. DO NOT wear contact lenses on surgery day.  You may not have dentures,                     Hearing aides, contacts or glasses in the operating room. These items can be                    Placed in the Recovery Room to receive immediately after surgery.  __x___ 9. IF YOU ARE SCHEDULED TO GO HOME ON THE SAME DAY, YOU MUST                   Have someone to drive you home and to stay with you  for the first 24 hours.                    Have an arrangement prior to arriving on surgery day.  ___x__ 10. Take the following medications on the morning of  surgery with a sip of  water:                              1. FLONASE NASAL SPRAY                     2.                     3.  _____ 11.  Follow any instructions provided to you by your surgeon.                        Such as enema, clear liquid bowel prep  __X__  12. STOP ASPIRIN AND ALL ASPIRIN PRODUCTS ONE WEEK PRIOR TO SURGERY,                      (BY 01/03/21)                       THIS INCLUDES BC POWDERS / GOODIES POWDER  __x___ 13. STOP Anti-inflammatories as of MAY 16TH                      This includes IBUPROFEN / MOTRIN / ADVIL / ALEVE/ NAPROXYN                    YOU MAY TAKE TYLENOL ANY TIME PRIOR TO SURGERY.  ___X__ 14.  Stop supplements until after surgery.                     This includes: MULTIVITAMINS               __X____16.  Stop Metformin 2 full days prior to surgery.  LAST DOSE ON Friday, MAY 20TH                           (after your morning dose)                                        Do NOT take any diabetes medications on surgery day.  __x___17.  Continue to take the following medications but do not take on the morning of surgery:                        Benazepril (Lotensin)  _x_____18. Wear clean and comfortable clothing to the hospital  BRING PHONE [place].

## 2021-01-05 ENCOUNTER — Other Ambulatory Visit: Payer: Self-pay

## 2021-01-05 ENCOUNTER — Other Ambulatory Visit
Admission: RE | Admit: 2021-01-05 | Discharge: 2021-01-05 | Disposition: A | Discharge status: Home / Self Care | Payer: BC Managed Care – PPO | Source: Ambulatory Visit | Attending: Urology | Admitting: Urology

## 2021-01-05 DIAGNOSIS — I10 Essential (primary) hypertension: Secondary | ICD-10-CM | POA: Diagnosis not present

## 2021-01-05 DIAGNOSIS — I440 Atrioventricular block, first degree: Secondary | ICD-10-CM | POA: Diagnosis not present

## 2021-01-05 DIAGNOSIS — Z01818 Encounter for other preprocedural examination: Secondary | ICD-10-CM | POA: Diagnosis not present

## 2021-01-05 LAB — BASIC METABOLIC PANEL
Anion gap: 9 (ref 5–15)
BUN: 13 mg/dL (ref 8–23)
CO2: 22 mmol/L (ref 22–32)
Calcium: 9 mg/dL (ref 8.9–10.3)
Chloride: 107 mmol/L (ref 98–111)
Creatinine, Ser: 0.64 mg/dL (ref 0.61–1.24)
GFR, Estimated: >60 mL/min (ref >60)
Glucose, Bld: 79 mg/dL (ref 70–99)
Potassium: 3.9 mmol/L (ref 3.5–5.1)
Sodium: 138 mmol/L (ref 135–145)

## 2021-01-05 LAB — CBC
HCT: 39.5 % (ref 39.0–52.0)
Hemoglobin: 13.5 g/dL (ref 13.0–17.0)
MCH: 29.3 pg (ref 26.0–34.0)
MCHC: 34.2 g/dL (ref 30.0–36.0)
MCV: 85.7 fL (ref 80.0–100.0)
Platelets: 152 10*3/uL (ref 150–400)
RBC: 4.61 MIL/uL (ref 4.22–5.81)
RDW: 12.4 % (ref 11.5–15.5)
WBC: 7.2 10*3/uL (ref 4.0–10.5)
nRBC: 0 % (ref 0.0–0.2)

## 2021-01-10 ENCOUNTER — Other Ambulatory Visit: Payer: Self-pay

## 2021-01-10 ENCOUNTER — Ambulatory Visit: Payer: BC Managed Care – PPO | Admitting: Anesthesiology

## 2021-01-10 ENCOUNTER — Ambulatory Visit
Admission: RE | Admit: 2021-01-10 | Discharge: 2021-01-10 | Disposition: A | Discharge status: Home / Self Care | Payer: BC Managed Care – PPO | Attending: Urology | Admitting: Urology

## 2021-01-10 ENCOUNTER — Ambulatory Visit: Payer: BC Managed Care – PPO

## 2021-01-10 ENCOUNTER — Encounter
Admission: RE | Disposition: A | Discharge status: Home / Self Care | Payer: Self-pay | Source: Home / Self Care | Attending: Urology

## 2021-01-10 ENCOUNTER — Encounter: Payer: Self-pay | Admitting: Urology

## 2021-01-10 DIAGNOSIS — R911 Solitary pulmonary nodule: Secondary | ICD-10-CM | POA: Diagnosis not present

## 2021-01-10 DIAGNOSIS — Z87891 Personal history of nicotine dependence: Secondary | ICD-10-CM | POA: Insufficient documentation

## 2021-01-10 DIAGNOSIS — C679 Malignant neoplasm of bladder, unspecified: Secondary | ICD-10-CM | POA: Diagnosis not present

## 2021-01-10 DIAGNOSIS — C678 Malignant neoplasm of overlapping sites of bladder: Secondary | ICD-10-CM | POA: Diagnosis not present

## 2021-01-10 DIAGNOSIS — C670 Malignant neoplasm of trigone of bladder: Secondary | ICD-10-CM | POA: Diagnosis not present

## 2021-01-10 DIAGNOSIS — C672 Malignant neoplasm of lateral wall of bladder: Secondary | ICD-10-CM | POA: Diagnosis not present

## 2021-01-10 HISTORY — PX: TRANSURETHRAL RESECTION OF BLADDER TUMOR: SHX2575

## 2021-01-10 LAB — GLUCOSE, CAPILLARY
Glucose-Capillary: 98 mg/dL (ref 70–99)
Glucose-Capillary: 92 mg/dL (ref 70–99)

## 2021-01-10 SURGERY — TURBT (TRANSURETHRAL RESECTION OF BLADDER TUMOR)
Anesthesia: General | Laterality: Right

## 2021-01-10 MED ORDER — DEXAMETHASONE SODIUM PHOSPHATE 10 MG/ML IJ SOLN
INTRAMUSCULAR | Status: DC | PRN
  Administered 2021-01-10: 5 mg via INTRAVENOUS

## 2021-01-10 MED ORDER — CEFAZOLIN SODIUM-DEXTROSE 2-4 GM/100ML-% IV SOLN
INTRAVENOUS | Status: AC
  Filled 2021-01-10: qty 100

## 2021-01-10 MED ORDER — LIDOCAINE HCL (CARDIAC) PF 100 MG/5ML IV SOSY
PREFILLED_SYRINGE | INTRAVENOUS | Status: DC | PRN
  Administered 2021-01-10: 40 mg via INTRAVENOUS

## 2021-01-10 MED ORDER — FENTANYL CITRATE (PF) 100 MCG/2ML IJ SOLN
INTRAMUSCULAR | Status: DC | PRN
  Administered 2021-01-10 (×4): 25 ug via INTRAVENOUS

## 2021-01-10 MED ORDER — MIDAZOLAM HCL 2 MG/2ML IJ SOLN
INTRAMUSCULAR | Status: AC
  Filled 2021-01-10: qty 2

## 2021-01-10 MED ORDER — FENTANYL CITRATE (PF) 100 MCG/2ML IJ SOLN
25.0000 ug | INTRAMUSCULAR | Status: DC | PRN
Start: 2021-01-10 — End: 2021-01-10
  Administered 2021-01-10 (×3): 25 ug via INTRAVENOUS

## 2021-01-10 MED ORDER — GEMCITABINE CHEMO FOR BLADDER INSTILLATION 2000 MG
INTRAVENOUS | Status: DC | PRN
  Administered 2021-01-10: 2000 mg via INTRAVESICAL

## 2021-01-10 MED ORDER — FENTANYL CITRATE (PF) 100 MCG/2ML IJ SOLN
INTRAMUSCULAR | Status: AC
  Filled 2021-01-10: qty 2

## 2021-01-10 MED ORDER — ONDANSETRON HCL 4 MG/2ML IJ SOLN
INTRAMUSCULAR | Status: AC
  Filled 2021-01-10: qty 2

## 2021-01-10 MED ORDER — HYDROCODONE-ACETAMINOPHEN 5-325 MG PO TABS: 1.0000 | ORAL_TABLET | Freq: Four times a day (QID) | ORAL | 0 refills | Status: DC | PRN

## 2021-01-10 MED ORDER — DEXMEDETOMIDINE (PRECEDEX) IN NS 20 MCG/5ML (4 MCG/ML) IV SYRINGE
PREFILLED_SYRINGE | INTRAVENOUS | Status: AC
  Filled 2021-01-10: qty 5

## 2021-01-10 MED ORDER — MIDAZOLAM HCL 2 MG/2ML IJ SOLN
INTRAMUSCULAR | Status: DC | PRN
  Administered 2021-01-10: 2 mg via INTRAVENOUS

## 2021-01-10 MED ORDER — DEXAMETHASONE SODIUM PHOSPHATE 10 MG/ML IJ SOLN
INTRAMUSCULAR | Status: AC
  Filled 2021-01-10: qty 1

## 2021-01-10 MED ORDER — CEFAZOLIN SODIUM-DEXTROSE 2-4 GM/100ML-% IV SOLN
2.0000 g | INTRAVENOUS | Status: AC
  Administered 2021-01-10: 2 g via INTRAVENOUS

## 2021-01-10 MED ORDER — PROPOFOL 10 MG/ML IV BOLUS
INTRAVENOUS | Status: DC | PRN
  Administered 2021-01-10: 50 mg via INTRAVENOUS
  Administered 2021-01-10: 150 mg via INTRAVENOUS

## 2021-01-10 MED ORDER — ONDANSETRON HCL 4 MG/2ML IJ SOLN
INTRAMUSCULAR | Status: DC | PRN
  Administered 2021-01-10: 4 mg via INTRAVENOUS

## 2021-01-10 MED ORDER — FAMOTIDINE 20 MG PO TABS: 20.0000 mg | ORAL_TABLET | Freq: Once | ORAL | Status: AC

## 2021-01-10 MED ORDER — CHLORHEXIDINE GLUCONATE 0.12 % MT SOLN: 15.0000 mL | Freq: Once | OROMUCOSAL | Status: AC

## 2021-01-10 MED ORDER — DEXMEDETOMIDINE (PRECEDEX) IN NS 20 MCG/5ML (4 MCG/ML) IV SYRINGE
PREFILLED_SYRINGE | INTRAVENOUS | Status: DC | PRN
  Administered 2021-01-10: 8 ug via INTRAVENOUS
  Administered 2021-01-10: 4 ug via INTRAVENOUS

## 2021-01-10 MED ORDER — FAMOTIDINE 20 MG PO TABS
ORAL_TABLET | ORAL | Status: AC
  Administered 2021-01-10: 20 mg via ORAL
  Filled 2021-01-10: qty 1

## 2021-01-10 MED ORDER — CHLORHEXIDINE GLUCONATE 0.12 % MT SOLN
OROMUCOSAL | Status: AC
  Administered 2021-01-10: 15 mL via OROMUCOSAL
  Filled 2021-01-10: qty 15

## 2021-01-10 MED ORDER — ORAL CARE MOUTH RINSE: 15.0000 mL | Freq: Once | OROMUCOSAL | Status: AC

## 2021-01-10 MED ORDER — FENTANYL CITRATE (PF) 100 MCG/2ML IJ SOLN
INTRAMUSCULAR | Status: AC
  Administered 2021-01-10: 25 ug via INTRAVENOUS
  Filled 2021-01-10: qty 2

## 2021-01-10 MED ORDER — ONDANSETRON HCL 4 MG/2ML IJ SOLN: 4.0000 mg | Freq: Once | INTRAMUSCULAR | Status: DC | PRN

## 2021-01-10 MED ORDER — OXYBUTYNIN CHLORIDE 5 MG PO TABS: 5.0000 mg | ORAL_TABLET | Freq: Three times a day (TID) | ORAL | 0 refills | Status: DC | PRN

## 2021-01-10 MED ORDER — PROPOFOL 10 MG/ML IV BOLUS
INTRAVENOUS | Status: AC
  Filled 2021-01-10: qty 20

## 2021-01-10 MED ORDER — SODIUM CHLORIDE 0.9 % IV SOLN: INTRAVENOUS | Status: DC

## 2021-01-10 SURGICAL SUPPLY — 48 items
BAG DRAIN CYSTO-URO LG1000N (MISCELLANEOUS) ×3 IMPLANT
BAG DRN RND TRDRP ANRFLXCHMBR (UROLOGICAL SUPPLIES) ×2
BAG URINE DRAIN 2000ML AR STRL (UROLOGICAL SUPPLIES) ×3 IMPLANT
BASKET ZERO TIP 1.9FR (BASKET) IMPLANT
BRUSH SCRUB EZ  4% CHG (MISCELLANEOUS) ×3
BRUSH SCRUB EZ 1% IODOPHOR (MISCELLANEOUS) ×3 IMPLANT
BRUSH SCRUB EZ 4% CHG (MISCELLANEOUS) ×2 IMPLANT
BSKT STON RTRVL ZERO TP 1.9FR (BASKET)
CATH FOLEY 2WAY  5CC 16FR (CATHETERS) ×3
CATH FOLEY 2WAY 5CC 16FR (CATHETERS) ×2
CATH URET FLEX-TIP 2 LUMEN 10F (CATHETERS) IMPLANT
CATH URETL 5X70 OPEN END (CATHETERS) ×3 IMPLANT
CATH URTH 16FR FL 2W BLN LF (CATHETERS) ×2 IMPLANT
CNTNR SPEC 2.5X3XGRAD LEK (MISCELLANEOUS)
CONT SPEC 4OZ STER OR WHT (MISCELLANEOUS)
CONT SPEC 4OZ STRL OR WHT (MISCELLANEOUS)
CONTAINER SPEC 2.5X3XGRAD LEK (MISCELLANEOUS) IMPLANT
DRAPE UTILITY 15X26 TOWEL STRL (DRAPES) ×3 IMPLANT
DRSG TELFA 4X3 1S NADH ST (GAUZE/BANDAGES/DRESSINGS) ×3 IMPLANT
ELECT LOOP 22F BIPOLAR SML (ELECTROSURGICAL) ×3
ELECT REM PT RETURN 9FT ADLT (ELECTROSURGICAL)
ELECTRODE LOOP 22F BIPOLAR SML (ELECTROSURGICAL) ×2 IMPLANT
ELECTRODE REM PT RTRN 9FT ADLT (ELECTROSURGICAL) IMPLANT
GLOVE SURG ENC MOIS LTX SZ6.5 (GLOVE) ×3 IMPLANT
GOWN STRL REUS W/ TWL LRG LVL3 (GOWN DISPOSABLE) ×4 IMPLANT
GOWN STRL REUS W/TWL LRG LVL3 (GOWN DISPOSABLE) ×6
GUIDEWIRE GREEN .038 145CM (MISCELLANEOUS) IMPLANT
GUIDEWIRE STR DUAL SENSOR (WIRE) IMPLANT
INFUSOR MANOMETER BAG 3000ML (MISCELLANEOUS) IMPLANT
IV NS IRRIG 3000ML ARTHROMATIC (IV SOLUTION) ×3 IMPLANT
KIT TURNOVER CYSTO (KITS) ×3 IMPLANT
LOOP CUT BIPOLAR 24F LRG (ELECTROSURGICAL) IMPLANT
MANIFOLD NEPTUNE II (INSTRUMENTS) IMPLANT
NDL SAFETY ECLIPSE 18X1.5 (NEEDLE) ×2 IMPLANT
NEEDLE HYPO 18GX1.5 SHARP (NEEDLE) ×3
PACK CYSTO AR (MISCELLANEOUS) ×3 IMPLANT
PAD ARMBOARD 7.5X6 YLW CONV (MISCELLANEOUS) ×3 IMPLANT
SET CYSTO W/LG BORE CLAMP LF (SET/KITS/TRAYS/PACK) ×3 IMPLANT
SET IRRIG Y TYPE TUR BLADDER L (SET/KITS/TRAYS/PACK) ×3 IMPLANT
SHEATH URETERAL 12FRX35CM (MISCELLANEOUS) IMPLANT
STENT URET 6FRX24 CONTOUR (STENTS) IMPLANT
STENT URET 6FRX26 CONTOUR (STENTS) IMPLANT
SURGILUBE 2OZ TUBE FLIPTOP (MISCELLANEOUS) ×3 IMPLANT
SYR TOOMEY IRRIG 70ML (MISCELLANEOUS) ×3
SYRINGE TOOMEY IRRIG 70ML (MISCELLANEOUS) ×2 IMPLANT
TRACTIP FLEXIVA PULSE ID 200 (Laser) IMPLANT
WATER STERILE IRR 1000ML POUR (IV SOLUTION) ×3 IMPLANT
WATER STERILE IRR 3000ML UROMA (IV SOLUTION) IMPLANT

## 2021-01-10 NOTE — Anesthesia Procedure Notes (Signed)
Procedure Name: LMA Insertion Date/Time: 01/10/2021 3:32 PM Performed by: Rolla Plate, CRNA Pre-anesthesia Checklist: Patient identified, Patient being monitored, Timeout performed, Emergency Drugs available and Suction available Patient Re-evaluated:Patient Re-evaluated prior to induction Oxygen Delivery Method: Circle system utilized Preoxygenation: Pre-oxygenation with 100% oxygen Induction Type: IV induction Ventilation: Mask ventilation without difficulty LMA: LMA inserted LMA Size: 4.0 Tube type: Oral Number of attempts: 1 Placement Confirmation: positive ETCO2 and breath sounds checked- equal and bilateral Tube secured with: Tape Dental Injury: Teeth and Oropharynx as per pre-operative assessment

## 2021-01-10 NOTE — Transfer of Care (Cosign Needed)
Immediate Anesthesia Transfer of Care Note  Patient: Ian Leblanc  Procedure(s) Performed: TRANSURETHRAL RESECTION OF BLADDER TUMOR (TURBT) WITH GEMCITABINE (N/A )  Patient Location: PACU  Anesthesia Type:General  Level of Consciousness: awake  Airway & Oxygen Therapy: Patient Spontanous Breathing and Patient connected to face mask oxygen  Post-op Assessment: Report given to RN and Post -op Vital signs reviewed and stable  Post vital signs: Reviewed and stable  Last Vitals:  Vitals Value Taken Time  BP 175/107 01/10/21 1615  Temp 36.5 C 01/10/21 1609  Pulse 81 01/10/21 1621  Resp 19 01/10/21 1621  SpO2 100 % 01/10/21 1621  Vitals shown include unvalidated device data.  Last Pain:  Vitals:   01/10/21 1609  TempSrc:   PainSc: 0-No pain         Complications: No complications documented.

## 2021-01-10 NOTE — Anesthesia Preprocedure Evaluation (Addendum)
Anesthesia Evaluation  Patient identified by MRN, date of birth, ID band Patient awake    Reviewed: Allergy & Precautions, NPO status , Patient's Chart, lab work & pertinent test results  Airway Mallampati: II  TM Distance: >3 FB     Dental  (+) Teeth Intact   Pulmonary former smoker,    Pulmonary exam normal        Cardiovascular hypertension, Normal cardiovascular exam     Neuro/Psych negative neurological ROS  negative psych ROS   GI/Hepatic Neg liver ROS,   Endo/Other  diabetes  Renal/GU negative Renal ROS Bladder dysfunction      Musculoskeletal  (+) Arthritis , Osteoarthritis,    Abdominal Normal abdominal exam  (+)   Peds negative pediatric ROS (+)  Hematology negative hematology ROS (+)   Anesthesia Other Findings Past Medical History: 07/16/2013: Abnormal EKG No date: Arthritis No date: Cancer [facility])     Comment:  bladder 02/04/2015: Cervical spine disease 07/16/2013: Chest pain No date: Diabetes mellitus without complication (HCC) No date: History of kidney stones No date: Hyperglycemia No date: Hyperlipidemia No date: Hypertension 02/04/2015: Neck pain on left side 02/04/2015: Numbness of fingers 02/04/2015: Paresthesia of left arm 12/09/2014: Screening for prostate cancer No date: Sessile colonic polyp  Reproductive/Obstetrics                             Anesthesia Physical Anesthesia Plan  ASA: II  Anesthesia Plan: General   Post-op Pain Management:    Induction: Intravenous  PONV Risk Score and Plan:   Airway Management Planned: Oral ETT  Additional Equipment:   Intra-op Plan:   Post-operative Plan: Extubation in OR  Informed Consent: I have reviewed the patients History and Physical, chart, labs and discussed the procedure including the risks, benefits and alternatives for the proposed anesthesia with the patient or authorized representative who has  indicated his/her understanding and acceptance.     Dental advisory given  Plan Discussed with: CRNA and Surgeon  Anesthesia Plan Comments:         Anesthesia Quick Evaluation

## 2021-01-10 NOTE — Discharge Instructions (Signed)
Transurethral Resection of Bladder Tumor (TURBT) or Bladder Biopsy ° ° °Definition: ° Transurethral Resection of the Bladder Tumor is a surgical procedure used to diagnose and remove tumors within the bladder. TURBT is the most common treatment for early stage bladder cancer. ° °General instructions: °   ° Your recent bladder surgery requires very little post hospital care but some definite precautions. ° °Despite the fact that no skin incisions were used, the area around the bladder incisions are raw and covered with scabs to promote healing and prevent bleeding. Certain precautions are needed to insure that the scabs are not disturbed over the next 2-4 weeks while the healing proceeds. ° °Because the raw surface inside your bladder and the irritating effects of urine you may expect frequency of urination and/or urgency (a stronger desire to urinate) and perhaps even getting up at night more often. This will usually resolve or improve slowly over the healing period. You may see some blood in your urine over the first 6 weeks. Do not be alarmed, even if the urine was clear for a while. Get off your feet and drink lots of fluids until clearing occurs. If you start to pass clots or don't improve call us. ° °Diet: ° °You may return to your normal diet immediately. Because of the raw surface of your bladder, alcohol, spicy foods, foods high in acid and drinks with caffeine may cause irritation or frequency and should be used in moderation. To keep your urine flowing freely and avoid constipation, drink plenty of fluids during the day (8-10 glasses). Tip: Avoid cranberry juice because it is very acidic. ° °Activity: ° °Your physical activity doesn't need to be restricted. However, if you are very active, you may see some blood in the urine. We suggest that you reduce your activity under the circumstances until the bleeding has stopped. ° °Bowels: ° °It is important to keep your bowels regular during the postoperative  period. Straining with bowel movements can cause bleeding. A bowel movement every other day is reasonable. Use a mild laxative if needed, such as milk of magnesia 2-3 tablespoons, or 2 Dulcolax tablets. Call if you continue to have problems. If you had been taking narcotics for pain, before, during or after your surgery, you may be constipated. Take a laxative if necessary. ° ° ° °Medication: ° °You should resume your pre-surgery medications unless told not to. In addition you may be given an antibiotic to prevent or treat infection. Antibiotics are not always necessary. All medication should be taken as prescribed until the bottles are finished unless you are having an unusual reaction to one of the drugs. ° ° °[facility] °[place], [place] [address] °[phone] ° ° ° °AMBULATORY SURGERY  °DISCHARGE INSTRUCTIONS ° ° °1) The drugs that you were given will stay in your system until tomorrow so for the next 24 hours you should not: ° °A) Drive an automobile °B) Make any legal decisions °C) Drink any alcoholic beverage ° ° °2) You may resume regular meals tomorrow.  Today it is better to start with liquids and gradually work up to solid foods. ° °You may eat anything you prefer, but it is better to start with liquids, then soup and crackers, and gradually work up to solid foods. ° ° °3) Please notify your doctor immediately if you have any unusual bleeding, trouble breathing, redness and pain at the surgery site, drainage, fever, or pain not relieved by medication. ° ° ° °4) Additional Instructions: ° ° ° ° ° ° ° °  Please contact your physician with any problems or Same Day Surgery at [phone], Monday through Friday 6 am to 4 pm, or [place] at  Main number at [phone]. ° °

## 2021-01-10 NOTE — Op Note (Signed)
Date of procedure: 01/10/21  Preoperative diagnosis:  1. Bladder cancer, multifocal overlapping  Postoperative diagnosis:  1. Same as above  Procedure: 1. TURBT, small  Surgeon: Hollice Espy, MD  Anesthesia: General  Complications: None  Intraoperative findings: Multifocal tumors which are low-grade papillary type appearance, largest 1.5 cm on the left lateral bladder wall.  EBL: Minimal  Specimens: Bladder tumor, multifocal  Drains: 16 French Foley catheter with 30 cc in the balloon  Indication: Ian Leblanc is a 60 y.o. patient with hematuria found to have findings consistent with bladder cancer, multifocal.  After reviewing the management options for treatment, he elected to proceed with the above surgical procedure(s). We have discussed the potential benefits and risks of the procedure, side effects of the proposed treatment, the likelihood of the patient achieving the goals of the procedure, and any potential problems that might occur during the procedure or recuperation. Informed consent has been obtained.  Description of procedure:  The patient was taken to the operating room and general anesthesia was induced.  The patient was placed in the dorsal lithotomy position, prepped and draped in the usual sterile fashion, and preoperative antibiotics were administered. A preoperative time-out was performed.   No sounds were used to dilate the urethra.  A 26 French resectoscope was then advanced using a blunt obturator.  The bladder was visualized.  There are multiple bladder tumors, at least 6 or 7 the largest of which was about 1.5 cm in a broad base which had a papillary lower grade appearance.  The remainder of the tumors were subcentimeter.  There is also 1 tumor adjacent to the right UO which was in close proximity but not involving the UO and some papillary carpeting just distal to the orifice itself.  I first used a bipolar loop to take down the largest of the tumors.   Some smaller subsequent tumors rate resected en bloc using cold cup biopsy forceps including adjacent to the right UO.  Careful and adequate hemostasis was then achieved using bipolar electrocautery.  Careful attention was made to avoid fulguration overlying the right UO.  The end of the procedure, clear E flux could be seen from both ureters thus no stent was deemed necessary.  The bladder chips were evacuated from the bladder.  There is no active bleeding noted.  The remainder of the bladder was inspected and there were no additional tumors appreciated.  Finally, the scope was removed.  A 16 French Foley catheter was placed with 30 cc in the balloon.  The patient was then taken to PACU in stable condition.  2000 mg of intravesical gemcitabine was instilled to the bladder.  It was allowed to dwell there for 1 hour.  After 1 hour, it was drained and the Foley was removed.  We will plan on calling with his pathology results.  We will tentatively plan for repeat cystoscopy in 3 months in the office.  If we need to change this plan, will discuss that with him by phone.  Hollice Espy, M.D.

## 2021-01-10 NOTE — H&P (Signed)
Updated today 01/10/21 without changes RRR CTAB  CC:     Chief Complaint  Patient presents with  . Cysto    HPI: 60 year old male with gross hematuria who presents today for follow-up of CT urogram and cystoscopy.  CT urogram completed on 12/02/2020 does show 1 small area within the bladder, several millimeters in size near the dome which may recheck present filling defect.  Otherwise, no additional upper tract pathology identified.  Small incidental pulmonary nodule felt to be likely benign.     Vitals:   12/07/20 1550  BP: (!) 168/91  Pulse: 66   NED. A&Ox3.   No respiratory distress   Abd soft, NT, ND Normal phallus with bilateral descended testicles  Cystoscopy Procedure Note  Patient identification was confirmed, informed consent was obtained, and patient was prepped using Betadine solution.  Lidocaine jelly was administered per urethral meatus.     Pre-Procedure: - Inspection reveals a normal caliber ureteral meatus.  Procedure: The flexible cystoscope was introduced without difficulty - No urethral strictures/lesions are present. - Normal prostate  - Normal bladder neck with a small papillary 0.5 cm tumor on the right lateral bladder neck adjacent to the right UO - Bilateral ureteral orifices identified - Bladder mucosa  reveals at least 7 additional tumors on the left lateral bladder wall, confluence measuring up to about 2 cm with somewhat low-grade superficial appearance - No bladder stones - No trabeculation  Retroflexion shows above tumor bladder neck adjacent to the right UO   Post-Procedure: - Patient tolerated the procedure well  Assessment/ Plan:  1. Malignant neoplasm of lateral wall of urinary bladder (HCC) Multifocal bladder cancer  Discussed proceeding to the operating room for TURBT (medium), possible right ureteral stent based on the proximity to the right UO if resection is deemed necessary.  We also discussed intravesical  gemcitabine along with risk and benefits of this.  We discussed the risk of bleeding, infection, damage chronic structures amongst others.  All questions were answered.  He is agreeable to proceed as planned. - Urinalysis, Complete - CULTURE, URINE COMPREHENSIVE  2. Malignant neoplasm of trigone of urinary bladder (HCC) As above

## 2021-01-11 ENCOUNTER — Encounter: Payer: Self-pay | Admitting: Urology

## 2021-01-11 NOTE — Anesthesia Postprocedure Evaluation (Signed)
Anesthesia Post Note  Patient: Ian Leblanc  Procedure(s) Performed: TRANSURETHRAL RESECTION OF BLADDER TUMOR (TURBT) WITH GEMCITABINE (N/A )  Patient location during evaluation: PACU Anesthesia Type: General Level of consciousness: awake and alert Pain management: pain level controlled Vital Signs Assessment: post-procedure vital signs reviewed and stable Respiratory status: spontaneous breathing, nonlabored ventilation, respiratory function stable and patient connected to nasal cannula oxygen Cardiovascular status: blood pressure returned to baseline and stable Postop Assessment: no apparent nausea or vomiting Anesthetic complications: no   No complications documented.   Last Vitals:  Vitals:   01/10/21 1700 01/10/21 1715  BP: (!) 179/99 (!) 159/89  Pulse: 85 82  Resp: 15 16  Temp: 36.8 C (!) 36.4 C  SpO2: 96% 96%    Last Pain:  Vitals:   01/10/21 1715  TempSrc: Temporal  PainSc: 0-No pain                 Arita Miss

## 2021-01-12 ENCOUNTER — Encounter: Payer: Self-pay | Admitting: Urology

## 2021-01-12 LAB — SURGICAL PATHOLOGY

## 2021-01-14 NOTE — Addendum Note (Signed)
Addendum  created 01/14/21 1124 by Allean Found, CRNA   Attestation recorded in [place], [place] accepted, [employer] filed

## 2021-02-01 ENCOUNTER — Other Ambulatory Visit: Payer: Self-pay | Admitting: Family Medicine

## 2021-02-01 DIAGNOSIS — J309 Allergic rhinitis, unspecified: Secondary | ICD-10-CM

## 2021-02-09 ENCOUNTER — Other Ambulatory Visit: Payer: Self-pay | Admitting: Family Medicine

## 2021-02-09 DIAGNOSIS — E782 Mixed hyperlipidemia: Secondary | ICD-10-CM

## 2021-02-16 ENCOUNTER — Other Ambulatory Visit: Payer: Self-pay

## 2021-02-16 DIAGNOSIS — E782 Mixed hyperlipidemia: Secondary | ICD-10-CM

## 2021-02-16 MED ORDER — ATORVASTATIN CALCIUM 80 MG PO TABS: 80.0000 mg | ORAL_TABLET | Freq: Every day | ORAL | 3 refills | Status: DC

## 2021-03-22 ENCOUNTER — Other Ambulatory Visit: Payer: Self-pay | Admitting: Family Medicine

## 2021-03-22 DIAGNOSIS — E1165 Type 2 diabetes mellitus with hyperglycemia: Secondary | ICD-10-CM

## 2021-04-12 ENCOUNTER — Other Ambulatory Visit: Payer: Self-pay | Admitting: Urology

## 2021-04-12 NOTE — Progress Notes (Signed)
   04/13/2021  CC:  Chief Complaint  Ian Leblanc presents with   Cysto    HPI: Dair Necessary is a 60 y.o. male who returns today for 3 month repeat cystoscopy.   He has a personal history of kidney stones. He spontaneously passed a stone over 10 years ago. It required no further intervention. He has no had a stone since.   He also has a personal history of gross hematuria. Believed to have underwent work-up at [facility] about 6 years ago for an episode of gross hematuria.   CT urogram on 12/02/2020 showed 1 small area within the bladder, several millimeters in size near the dome which may recheck present filling defect. Otherwise there were no additional upper tract pathology identified. Small incidental pulmonary nodule felt to be likely benign.   He underwent a transurethral resection of bladder tumor with Gemcitabine for malignant neoplasm of lateral wall of urinary bladder on 01/10/2021.   Surgical pathology revealed urinary bladder; transurethral resection: non invasive papillary urothelial carcinoma, low-grade. No muscularis propria identified.   He is doing well today    NED. A&Ox3.   No respiratory distress   Abd soft, NT, ND Normal phallus with bilateral descended testicles  Cystoscopy Procedure Note  Ian Leblanc identification was confirmed, informed consent was obtained, and Ian Leblanc was prepped using Betadine solution.  Lidocaine jelly was administered per urethral meatus.     Pre-Procedure: - Inspection reveals a normal caliber ureteral meatus.  Procedure: The flexible cystoscope was introduced without difficulty - No urethral strictures/lesions are present. -Normal prostate  -Slightly elevated  bladder neck - Bilateral ureteral orifices identified - Bladder mucosa  reveals no ulcers, tumors, or lesions - No bladder stones - No trabeculation  Retroflexion unremarkable    Post-Procedure: - Ian Leblanc tolerated the procedure well  Assessment/ Plan:  History  of bladder cancer  - NED  - Monitor with cystoscopy q 6 months given lack of recurrence today   Follow-up in 6 months for cystoscopy  Conley Rolls as a scribe for Hollice Espy, MD.,have documented all relevant documentation on the behalf of Hollice Espy, MD,as directed by  Hollice Espy, MD while in the presence of Hollice Espy, MD.  I have reviewed the above documentation for accuracy and completeness, and I agree with the above.   Hollice Espy, MD

## 2021-04-13 ENCOUNTER — Ambulatory Visit: Payer: BC Managed Care – PPO | Admitting: Urology

## 2021-04-13 ENCOUNTER — Other Ambulatory Visit: Payer: Self-pay

## 2021-04-13 DIAGNOSIS — C672 Malignant neoplasm of lateral wall of bladder: Secondary | ICD-10-CM | POA: Diagnosis not present

## 2021-04-14 LAB — URINALYSIS, COMPLETE
Bilirubin, UA: NEGATIVE
Glucose, UA: NEGATIVE
Ketones, UA: NEGATIVE
Leukocytes,UA: NEGATIVE
Nitrite, UA: NEGATIVE
Protein,UA: NEGATIVE
RBC, UA: NEGATIVE
Specific Gravity, UA: 1.02 (ref 1.005–1.030)
Urobilinogen, Ur: 0.2 mg/dL (ref 0.2–1.0)
pH, UA: 6 (ref 5.0–7.5)

## 2021-04-14 LAB — MICROSCOPIC EXAMINATION
Bacteria, UA: NONE SEEN
WBC, UA: NONE SEEN /hpf (ref 0–5)

## 2021-05-02 ENCOUNTER — Ambulatory Visit: Payer: BC Managed Care – PPO | Admitting: Family Medicine

## 2021-05-17 ENCOUNTER — Encounter: Payer: Self-pay | Admitting: Family Medicine

## 2021-05-17 ENCOUNTER — Other Ambulatory Visit: Payer: Self-pay

## 2021-05-17 ENCOUNTER — Ambulatory Visit: Payer: BC Managed Care – PPO | Admitting: Family Medicine

## 2021-05-17 ENCOUNTER — Other Ambulatory Visit: Payer: Self-pay | Admitting: Family Medicine

## 2021-05-17 VITALS — BP 140/84 | HR 57 | Temp 98.1°F | Resp 16 | Ht 70.0 in | Wt 222.9 lb

## 2021-05-17 DIAGNOSIS — E782 Mixed hyperlipidemia: Secondary | ICD-10-CM

## 2021-05-17 DIAGNOSIS — I10 Essential (primary) hypertension: Secondary | ICD-10-CM

## 2021-05-17 DIAGNOSIS — Z5181 Encounter for therapeutic drug level monitoring: Secondary | ICD-10-CM | POA: Diagnosis not present

## 2021-05-17 DIAGNOSIS — M542 Cervicalgia: Secondary | ICD-10-CM

## 2021-05-17 DIAGNOSIS — E669 Obesity, unspecified: Secondary | ICD-10-CM | POA: Diagnosis not present

## 2021-05-17 DIAGNOSIS — E1165 Type 2 diabetes mellitus with hyperglycemia: Secondary | ICD-10-CM

## 2021-05-17 DIAGNOSIS — J309 Allergic rhinitis, unspecified: Secondary | ICD-10-CM

## 2021-05-17 DIAGNOSIS — Z6830 Body mass index (BMI) 30.0-30.9, adult: Secondary | ICD-10-CM

## 2021-05-17 DIAGNOSIS — Z23 Encounter for immunization: Secondary | ICD-10-CM

## 2021-05-17 MED ORDER — BENAZEPRIL HCL 20 MG PO TABS: 20.0000 mg | ORAL_TABLET | Freq: Every day | ORAL | 1 refills | Status: DC

## 2021-05-17 NOTE — Progress Notes (Signed)
Name: Ian Leblanc   MRN: [id number]    DOB: 15-Jul-1961   Date:05/17/2021       Progress Note  Chief Complaint  Patient presents with   Follow-up   Diabetes   Hypertension   Hyperlipidemia     Subjective:   Ian Leblanc is a 60 y.o. male, presents to clinic for routine f/up  Hypertension:  Currently managed on benazepril 10 mg Pt reports good med compliance and denies any SE.   Blood pressure today is uncontrolled. BP Readings from Last 10 Encounters:  05/17/21 140/84  01/10/21 (!) 159/89  12/07/20 (!) 168/91  11/09/20 138/88  11/02/20 (!) 143/88  10/15/20 124/72  08/27/20 130/80  05/11/20 130/76  04/08/20 (!) 147/84  12/04/19 (!) 155/86   Pt denies CP, SOB, exertional sx, LE edema, palpitation, Ha's, visual disturbances, lightheadedness, hypotension, syncope.   DM:   Pt managing DM with metformin 500 mg once daily Reports good med compliance Pt has no SE from meds. Blood sugars not checking Denies: Polyuria, polydipsia, vision changes, neuropathy, hypoglycemia Recent pertinent labs: Lab Results  Component Value Date   HGBA1C 5.3 11/09/2020   HGBA1C 5.4 05/11/2020   HGBA1C 5.3 11/05/2019   Lab Results  Component Value Date   MICROALBUR 1.8 03/28/2018   LDLCALC 67 05/11/2020   CREATININE 0.64 01/05/2021   Standard of care and health maintenance: Foot exam:  due today DM eye exam:  due ACEI/ARB:  yes Statin:  yes  Due for foot exam and eye exam, on acei and statin  Hyperlipidemia: Currently treated with lipitor 80, pt reports good med compliance Last Lipids: Lab Results  Component Value Date   CHOL 126 05/11/2020   HDL 43 05/11/2020   LDLCALC 67 05/11/2020   TRIG 76 05/11/2020   CHOLHDL 2.9 05/11/2020   - Denies: Chest pain, shortness of breath, myalgias, claudication   Recent dx and tx of urothelial carcinoma with Dr. Erlene Quan: He underwent a transurethral resection of bladder tumor with Gemcitabine for malignant neoplasm of lateral  wall of urinary bladder on 01/10/2021.    Surgical pathology revealed urinary bladder; transurethral resection: non invasive papillary urothelial carcinoma, low-grade. No muscularis propria identified.   Less running, still going to the gym Wt Readings from Last 10 Encounters:  05/17/21 222 lb 14.4 oz (101.1 kg)  12/31/20 217 lb (98.4 kg)  11/09/20 220 lb 8 oz (100 kg)  11/02/20 217 lb (98.4 kg)  10/15/20 221 lb 4.8 oz (100.4 kg)  08/27/20 217 lb 12.8 oz (98.8 kg)  05/11/20 210 lb 12.8 oz (95.6 kg)  04/08/20 211 lb (95.7 kg)  12/04/19 211 lb 6.4 oz (95.9 kg)  11/05/19 211 lb 9.6 oz (96 kg)   BMI Readings from Last 5 Encounters:  05/17/21 31.98 kg/m  12/31/20 32.05 kg/m  11/09/20 31.64 kg/m  11/02/20 31.14 kg/m  10/15/20 31.75 kg/m      Current Outpatient Medications:    acetaminophen (TYLENOL) 500 MG tablet, Take 500 mg by mouth every 6 (six) hours as needed., Disp: , Rfl:    aspirin 81 MG tablet, Take 81 mg by mouth daily., Disp: , Rfl:    atorvastatin (LIPITOR) 80 MG tablet, Take 1 tablet (80 mg total) by mouth at bedtime., Disp: 90 tablet, Rfl: 3   benazepril (LOTENSIN) 10 MG tablet, Take 1 tablet (10 mg total) by mouth daily., Disp: 90 tablet, Rfl: 3   BINAXNOW COVID-19 AG HOME TEST KIT, Use as Directed on the Package, Disp: , Rfl:  cetirizine (ZYRTEC) 10 MG tablet, Take 1 tablet (10 mg total) by mouth daily., Disp: 30 tablet, Rfl: 11   fluticasone (FLONASE) 50 MCG/ACT nasal spray, Place 2 sprays into both nostrils daily., Disp: 16 g, Rfl: 6   HYDROcodone-acetaminophen (NORCO/VICODIN) 5-325 MG tablet, Take 1-2 tablets by mouth every 6 (six) hours as needed for moderate pain., Disp: 10 tablet, Rfl: 0   levocetirizine (XYZAL) 5 MG tablet, TAKE 1 TABLET BY MOUTH ONCE DAILY IN THE EVENING, Disp: 90 tablet, Rfl: 0   metFORMIN (GLUCOPHAGE) 500 MG tablet, TAKE 1 TABLET BY MOUTH TWICE DAILY WITH  A  MEAL, Disp: 180 tablet, Rfl: 0   Multiple Vitamins-Minerals (MULTIVITAMIN PO),  Take 1 tablet by mouth daily. Over 50 mvi qd, Disp: , Rfl:    nitrofurantoin, macrocrystal-monohydrate, (MACROBID) 100 MG capsule, Take 1 capsule (100 mg total) by mouth 2 (two) times daily., Disp: 20 capsule, Rfl: 0   oxybutynin (DITROPAN) 5 MG tablet, Take 1 tablet (5 mg total) by mouth every 8 (eight) hours as needed for bladder spasms., Disp: 30 tablet, Rfl: 0   tadalafil (CIALIS) 5 MG tablet, Take 5 mg by mouth daily., Disp: , Rfl:   Patient Active Problem List   Diagnosis Date Noted   Chronic rhinitis 08/27/2020   Controlled type 2 diabetes mellitus with hyperglycemia, without long-term current use of insulin ([place]) 07/04/2019   Allergic rhinitis 07/03/2019   Cervicalgia 02/04/2015   Diabetes mellitus type 2, uncomplicated ([place]) 11/57/2620   HTN (hypertension) 10/10/2012   HLD (hyperlipidemia) 10/10/2012   History of colonic polyps 10/10/2012    Past Surgical History:  Procedure Laterality Date   BACK SURGERY  2016   cyst removal. no metal in his back   TRANSURETHRAL RESECTION OF BLADDER TUMOR N/A 01/10/2021   Procedure: TRANSURETHRAL RESECTION OF BLADDER TUMOR (TURBT) WITH GEMCITABINE;  Surgeon: Hollice Espy, MD;  Location: [facility] ORS;  Service: Urology;  Laterality: N/A;    Family History  Problem Relation Age of Onset   Prostate cancer Paternal Grandfather     Social History   Tobacco Use   Smoking status: Former    Packs/day: 0.50    Types: Cigarettes    Quit date: 07/17/2003    Years since quitting: 17.8   Smokeless tobacco: Never  Vaping Use   Vaping Use: Never used  Substance Use Topics   Alcohol use: No    Comment: beer every now and then.   Drug use: No     Allergies  Allergen Reactions   Crestor [Rosuvastatin] Other (See Comments)    Severe myalgias    Health Maintenance  Topic Date Due   OPHTHALMOLOGY EXAM  10/19/2020   INFLUENZA VACCINE  03/21/2021   TETANUS/TDAP  03/23/2021   FOOT EXAM  05/11/2021   HEMOGLOBIN A1C  05/12/2021   COVID-19  Vaccine (3 - Pfizer risk series) 06/02/2021 (Originally 12/22/2019)   COLONOSCOPY (Pts 45-59yr Insurance coverage will need to be confirmed)  08/01/2028   Hepatitis C Screening  Completed   HIV Screening  Completed   Zoster Vaccines- Shingrix  Completed   HPV VACCINES  Aged Out    Chart Review Today: I personally reviewed active problem list, medication list, allergies, family history, social history, health maintenance, notes from last encounter, lab results, imaging with the patient/caregiver today.   Review of Systems  Constitutional: Negative.   HENT: Negative.    Eyes: Negative.   Respiratory: Negative.    Cardiovascular: Negative.   Gastrointestinal: Negative.   Endocrine: Negative.  Genitourinary: Negative.   Musculoskeletal: Negative.   Skin: Negative.   Allergic/Immunologic: Negative.   Neurological: Negative.   Hematological: Negative.   Psychiatric/Behavioral: Negative.    All other systems reviewed and are negative.   Objective:   Vitals:   05/17/21 1446 05/17/21 1544  BP: (!) 142/86 140/84  Pulse: (!) 57   Resp: 16   Temp: 98.1 F (36.7 C)   TempSrc: Oral   SpO2: 97%   Weight: 222 lb 14.4 oz (101.1 kg)   Height: $Remove'5\' 10"'[id number]$  (1.778 m)     Body mass index is 31.98 kg/m.  Physical Exam Vitals and nursing note reviewed.  Constitutional:      General: He is not in acute distress.    Appearance: Normal appearance. He is well-developed and overweight. He is not ill-appearing, toxic-appearing or diaphoretic.     Interventions: Face mask in place.  HENT:     Head: Normocephalic and atraumatic.     Jaw: No trismus.     Right Ear: External ear normal.     Left Ear: External ear normal.  Eyes:     General: Lids are normal. No scleral icterus.       Right eye: No discharge.        Left eye: No discharge.     Conjunctiva/sclera: Conjunctivae normal.  Neck:     Trachea: Trachea and phonation normal. No tracheal deviation.  Cardiovascular:     Rate and  Rhythm: Normal rate and regular rhythm.     Pulses: Normal pulses.          Radial pulses are 2+ on the right side and 2+ on the left side.       Posterior tibial pulses are 2+ on the right side and 2+ on the left side.     Heart sounds: Normal heart sounds. No murmur heard.   No friction rub. No gallop.  Pulmonary:     Effort: Pulmonary effort is normal. No respiratory distress.     Breath sounds: Normal breath sounds. No stridor. No wheezing, rhonchi or rales.  Abdominal:     General: Bowel sounds are normal. There is no distension.     Palpations: Abdomen is soft.  Musculoskeletal:     Right lower leg: No edema.     Left lower leg: No edema.  Skin:    General: Skin is warm and dry.     Coloration: Skin is not jaundiced.     Findings: No rash.     Nails: There is no clubbing.  Neurological:     Mental Status: He is alert. Mental status is at baseline.     Cranial Nerves: No dysarthria or facial asymmetry.     Motor: No tremor or abnormal muscle tone.     Gait: Gait normal.  Psychiatric:        Mood and Affect: Mood normal.        Speech: Speech normal.        Behavior: Behavior normal. Behavior is cooperative.     Diabetic Foot Exam - Simple   Simple Foot Form Diabetic Foot exam was performed with the following findings: Yes 05/17/2021  3:08 PM  Visual Inspection No deformities, no ulcerations, no other skin breakdown bilaterally: Yes Sensation Testing Intact to touch and monofilament testing bilaterally: Yes Pulse Check Posterior Tibialis and Dorsalis pulse intact bilaterally: Yes Comments       Assessment & Plan:     ICD-10-CM   1. Controlled type 2 diabetes  mellitus with hyperglycemia, without long-term current use of insulin (HCC)  E11.65 Hemoglobin A1C    COMPLETE METABOLIC PANEL WITH GFR    Lipid panel    benazepril (LOTENSIN) 20 MG tablet   Well-controlled diabetes he has been able to decrease his Metformin dose and remain controlled, continue to work on  healthy diet    2. Mixed hyperlipidemia  S25.4 COMPLETE METABOLIC PANEL WITH GFR    Lipid panel    3. Hypertension, unspecified type  C62 COMPLETE METABOLIC PANEL WITH GFR    benazepril (LOTENSIN) 20 MG tablet    4. Class 1 obesity with serious comorbidity and body mass index (BMI) of 30.0 to 30.9 in adult, unspecified obesity type  E66.9 Hemoglobin A1C   Y24.17 COMPLETE METABOLIC PANEL WITH GFR    Lipid panel    5. Encounter for medication monitoring  Z51.81 Hemoglobin A1C    COMPLETE METABOLIC PANEL WITH GFR    Lipid panel    CBC with Differential/Platelet    6. Need for influenza vaccination  Z23 Flu Vaccine QUAD 6+ mos PF IM (Fluarix Quad PF)    7. Need for tetanus, diphtheria, and acellular pertussis (Tdap) vaccine  Z23 Tdap vaccine greater than or equal to 7yo IM    8. Hypertension, unspecified type  B30 COMPLETE METABOLIC PANEL WITH GFR    benazepril (LOTENSIN) 20 MG tablet   Stable, well-controlled hypertension on benazepril continue DASH diet and other lifestyle efforts    9. Cervicalgia  M54.2    conservative mgmt - done meds, PT, intermittent radicular sx, massage has been helpful       Return in about 6 months (around 11/14/2021) for Follow-up.   Delsa Grana, PA-C 05/17/21 3:04 PM

## 2021-05-18 LAB — CBC WITH DIFFERENTIAL/PLATELET
Absolute Monocytes: 610 cells/uL (ref 200–950)
Basophils Absolute: 47 cells/uL (ref 0–200)
Basophils Relative: 0.7 %
Eosinophils Absolute: 101 cells/uL (ref 15–500)
Eosinophils Relative: 1.5 %
HCT: 43.9 % (ref 38.5–50.0)
Hemoglobin: 14.9 g/dL (ref 13.2–17.1)
Lymphs Abs: 1829 cells/uL (ref 850–3900)
MCH: 29.6 pg (ref 27.0–33.0)
MCHC: 33.9 g/dL (ref 32.0–36.0)
MCV: 87.3 fL (ref 80.0–100.0)
MPV: 12.4 fL (ref 7.5–12.5)
Monocytes Relative: 9.1 %
Neutro Abs: 4114 cells/uL (ref 1500–7800)
Neutrophils Relative %: 61.4 %
Platelets: 175 10*3/uL (ref 140–400)
RBC: 5.03 10*6/uL (ref 4.20–5.80)
RDW: 12.6 % (ref 11.0–15.0)
Total Lymphocyte: 27.3 %
WBC: 6.7 10*3/uL (ref 3.8–10.8)

## 2021-05-18 LAB — COMPLETE METABOLIC PANEL WITH GFR
AG Ratio: 2.1 (calc) (ref 1.0–2.5)
ALT: 50 U/L — ABNORMAL HIGH (ref 9–46)
AST: 28 U/L (ref 10–35)
Albumin: 4.5 g/dL (ref 3.6–5.1)
Alkaline phosphatase (APISO): 60 U/L (ref 35–144)
BUN: 13 mg/dL (ref 7–25)
CO2: 25 mmol/L (ref 20–32)
Calcium: 9.3 mg/dL (ref 8.6–10.3)
Chloride: 106 mmol/L (ref 98–110)
Creat: 0.75 mg/dL (ref 0.70–1.35)
Globulin: 2.1 g/dL (calc) (ref 1.9–3.7)
Glucose, Bld: 75 mg/dL (ref 65–99)
Potassium: 4 mmol/L (ref 3.5–5.3)
Sodium: 141 mmol/L (ref 135–146)
Total Bilirubin: 1 mg/dL (ref 0.2–1.2)
Total Protein: 6.6 g/dL (ref 6.1–8.1)
eGFR: 102 mL/min/{1.73_m2} (ref >=60)

## 2021-05-18 LAB — LIPID PANEL
Cholesterol: 158 mg/dL (ref <200)
HDL: 41 mg/dL (ref >=40)
LDL Cholesterol (Calc): 98 mg/dL (calc)
Non-HDL Cholesterol (Calc): 117 mg/dL (calc) (ref <130)
Total CHOL/HDL Ratio: 3.9 (calc) (ref <5.0)
Triglycerides: 97 mg/dL (ref <150)

## 2021-05-18 LAB — HEMOGLOBIN A1C
Hgb A1c MFr Bld: 5.5 % of total Hgb (ref <5.7)
Mean Plasma Glucose: 111 mg/dL
eAG (mmol/L): 6.2 mmol/L

## 2021-06-08 ENCOUNTER — Encounter: Payer: Self-pay | Admitting: Family Medicine

## 2021-06-09 ENCOUNTER — Telehealth (INDEPENDENT_AMBULATORY_CARE_PROVIDER_SITE_OTHER): Payer: BC Managed Care – PPO | Admitting: Nurse Practitioner

## 2021-06-09 ENCOUNTER — Other Ambulatory Visit: Payer: Self-pay

## 2021-06-09 ENCOUNTER — Encounter: Payer: Self-pay | Admitting: Nurse Practitioner

## 2021-06-09 DIAGNOSIS — J309 Allergic rhinitis, unspecified: Secondary | ICD-10-CM | POA: Diagnosis not present

## 2021-06-09 DIAGNOSIS — J069 Acute upper respiratory infection, unspecified: Secondary | ICD-10-CM | POA: Diagnosis not present

## 2021-06-09 MED ORDER — BENZONATATE 100 MG PO CAPS: 100.0000 mg | ORAL_CAPSULE | Freq: Three times a day (TID) | ORAL | 0 refills | Status: DC | PRN

## 2021-06-09 NOTE — Progress Notes (Signed)
Name: Ian Leblanc   MRN: [id number]    DOB: 1960/11/26   Date:06/09/2021       Progress Note  Subjective  Chief Complaint  Chief Complaint  Patient presents with   URI    Nasal drainage, headache, cough, throat irritated had taken 4 covid test all negative    I connected with  Ian Leblanc  on 06/09/21 at 11:40 AM EDT by a video enabled telemedicine application and verified that I am speaking with the correct person using two identifiers.  I discussed the limitations of evaluation and management by telemedicine and the availability of in person appointments. The patient expressed understanding and agreed to proceed with a virtual visit  Staff also discussed with the patient that there may be a patient responsible charge related to this service. Patient Location: home Provider Location: [facility] Additional Individuals present: alone  HPI  URI: He reports he has had nasal congestion, sore throat, cough and sneezing since Sunday.  He denies any fever or shortness of breath.  He says he has had 4 negative Covid tests.  He has been taking robitussin, xyzal, zyrtec and Flonase. He says he is feeling better but would like something for the cough.  Discussed OTC treatments and will send in tessalon perls.  Discussed pushing fluids and getting rest.    Chronic allergic rhinitis: He is currently using xyzal, zyrtec and Flonase.    Patient Active Problem List   Diagnosis Date Noted   Chronic rhinitis 08/27/2020   Controlled type 2 diabetes mellitus with hyperglycemia, without long-term current use of insulin ([place]) 07/04/2019   Allergic rhinitis 07/03/2019   Cervicalgia 02/04/2015   Diabetes mellitus type 2, uncomplicated ([place]) 78/29/5621   HTN (hypertension) 10/10/2012   HLD (hyperlipidemia) 10/10/2012   History of colonic polyps 10/10/2012    Social History   Tobacco Use   Smoking status: Former    Packs/day: 0.50    Types: Cigarettes    Quit date: 07/17/2003    Years since  quitting: 17.9   Smokeless tobacco: Never  Substance Use Topics   Alcohol use: No    Comment: beer every now and then.     Current Outpatient Medications:    acetaminophen (TYLENOL) 500 MG tablet, Take 500 mg by mouth every 6 (six) hours as needed., Disp: , Rfl:    aspirin 81 MG tablet, Take 81 mg by mouth daily., Disp: , Rfl:    atorvastatin (LIPITOR) 80 MG tablet, Take 1 tablet (80 mg total) by mouth at bedtime., Disp: 90 tablet, Rfl: 3   benazepril (LOTENSIN) 20 MG tablet, Take 1 tablet (20 mg total) by mouth daily., Disp: 90 tablet, Rfl: 1   BINAXNOW COVID-19 AG HOME TEST KIT, Use as Directed on the Package, Disp: , Rfl:    cetirizine (ZYRTEC) 10 MG tablet, Take 1 tablet (10 mg total) by mouth daily., Disp: 30 tablet, Rfl: 11   fluticasone (FLONASE) 50 MCG/ACT nasal spray, Place 2 sprays into both nostrils daily., Disp: 16 g, Rfl: 6   levocetirizine (XYZAL) 5 MG tablet, TAKE 1 TABLET BY MOUTH ONCE DAILY IN THE EVENING, Disp: 90 tablet, Rfl: 0   metFORMIN (GLUCOPHAGE) 500 MG tablet, TAKE 1 TABLET BY MOUTH TWICE DAILY WITH  A  MEAL, Disp: 180 tablet, Rfl: 0   Multiple Vitamins-Minerals (MULTIVITAMIN PO), Take 1 tablet by mouth daily. Over 50 mvi qd, Disp: , Rfl:    tadalafil (CIALIS) 5 MG tablet, Take 5 mg by mouth daily., Disp: , Rfl:  HYDROcodone-acetaminophen (NORCO/VICODIN) 5-325 MG tablet, Take 1-2 tablets by mouth every 6 (six) hours as needed for moderate pain. (Patient not taking: Reported on 06/09/2021), Disp: 10 tablet, Rfl: 0  Allergies  Allergen Reactions   Crestor [Rosuvastatin] Other (See Comments)    Severe myalgias    I personally reviewed active problem list, medication list, allergies with the patient/caregiver today.  ROS  Constitutional: Negative for fever or weight change.  Respiratory: Positive for cough and negative for shortness of breath.   Cardiovascular: Negative for chest pain or palpitations.  Gastrointestinal: Negative for abdominal pain, no bowel  changes.  Musculoskeletal: Negative for gait problem or joint swelling.  Skin: Negative for rash.  Neurological: Negative for dizziness or headache.  No other specific complaints in a complete review of systems (except as listed in HPI above).   Objective  Virtual encounter, vitals not obtained.  There is no height or weight on file to calculate BMI.  Nursing Note and Vital Signs reviewed.  Physical Exam  Awake alert and oriented, speaking in complete sentences  No results found for this or any previous visit (from the past 72 hour(s)).  Assessment & Plan  1. Viral upper respiratory tract infection -increase fluids, get rest - start taking mucinex  - benzonatate (TESSALON) 100 MG capsule; Take 1 capsule (100 mg total) by mouth 3 (three) times daily as needed for cough.  Dispense: 30 capsule; Refill: 0  2. Allergic rhinitis, unspecified seasonality, unspecified trigger - continue taking xyzal, zyrtec and Flonase  -Red flags and when to present for emergency care or RTC including fever >101.55F, chest pain, shortness of breath, new/worsening/un-resolving symptoms, reviewed with patient at time of visit. Follow up and care instructions discussed and provided in AVS. - I discussed the assessment and treatment plan with the patient. The patient was provided an opportunity to ask questions and all were answered. The patient agreed with the plan and demonstrated an understanding of the instructions.  I provided 20 minutes of non-face-to-face time during this encounter.  Ian Merino, FNP

## 2021-08-27 ENCOUNTER — Other Ambulatory Visit: Payer: Self-pay | Admitting: Family Medicine

## 2021-08-27 DIAGNOSIS — J309 Allergic rhinitis, unspecified: Secondary | ICD-10-CM

## 2021-08-27 NOTE — Telephone Encounter (Signed)
Requested Prescriptions  Pending Prescriptions Disp Refills   levocetirizine (XYZAL) 5 MG tablet [Pharmacy Med Name: Levocetirizine Dihydrochloride 5 MG Oral Tablet] 90 tablet 0    Sig: TAKE 1 TABLET BY MOUTH ONCE DAILY IN THE EVENING     Ear, Nose, and Throat:  Antihistamines Passed - 08/27/2021  2:23 PM      Passed - Valid encounter within last 12 months    Recent Outpatient Visits          2 months ago Viral upper respiratory tract infection   [facility] Serafina Royals F, FNP   3 months ago Controlled type 2 diabetes mellitus with hyperglycemia, without long-term current use of insulin [facility])   [facility] Delsa Grana, PA-C   9 months ago Controlled type 2 diabetes mellitus with hyperglycemia, without long-term current use of insulin [facility])   [facility] [place], Kristeen Miss, PA-C   10 months ago Hematuria, unspecified type   [facility] Delsa Grana, PA-C   1 year ago Need for shingles vaccine   [facility] Towanda Malkin, MD      Future Appointments            In 2 months Delsa Grana, PA-C [facility]

## 2021-10-10 ENCOUNTER — Ambulatory Visit: Payer: BC Managed Care – PPO | Admitting: Physician Assistant

## 2021-10-12 ENCOUNTER — Encounter: Payer: Self-pay | Admitting: Physician Assistant

## 2021-10-12 ENCOUNTER — Ambulatory Visit (INDEPENDENT_AMBULATORY_CARE_PROVIDER_SITE_OTHER): Payer: BC Managed Care – PPO | Admitting: Physician Assistant

## 2021-10-12 VITALS — BP 142/86 | HR 60 | Temp 98.2°F | Resp 16 | Ht 70.0 in | Wt 227.2 lb

## 2021-10-12 DIAGNOSIS — I10 Essential (primary) hypertension: Secondary | ICD-10-CM

## 2021-10-12 DIAGNOSIS — E1165 Type 2 diabetes mellitus with hyperglycemia: Secondary | ICD-10-CM | POA: Diagnosis not present

## 2021-10-12 DIAGNOSIS — E782 Mixed hyperlipidemia: Secondary | ICD-10-CM | POA: Diagnosis not present

## 2021-10-12 DIAGNOSIS — E119 Type 2 diabetes mellitus without complications: Secondary | ICD-10-CM | POA: Diagnosis not present

## 2021-10-12 NOTE — Assessment & Plan Note (Signed)
Chronic, historic condition, stable and appears well managed on current medications Recommend he continue to stay active and keep exercising  Discussed diet role in assisting with management Labs to dictate further management Continue current medications at this time Recommend follow up in 6 months

## 2021-10-12 NOTE — Assessment & Plan Note (Signed)
Chronic, historic conditio, overall stable with elevations from coming to office Recommend monitoring at home as able to discern between transient elevations vs sustained Continue current medications at this time. Continue exercise regimen and incorporate heart healthy diet  Follow up in 6 months for chronic condition management

## 2021-10-12 NOTE — Patient Instructions (Addendum)
I'd like you to try taking Melatonin 2 mg- 5 mg at night before bed for a few weeks and see how you respond. This is an over the counter medication that is very well tolerated by most people and helps with sleep initiation.   We can reconvene in the next few weeks before your flight if it is not working to discuss something you can take while on the flight to help with sleep.   It was nice to meet you and I appreciate the opportunity to be involved in your care

## 2021-10-12 NOTE — Progress Notes (Addendum)
Established Patient Office Visit  Subjective:  Patient ID: Ian Leblanc, male    DOB: 21-Apr-1961  Age: 61 y.o. MRN: [id number]  Today's Provider: Talitha Givens, MHS, PA-C Introduced myself to the patient as a PA-C and provided education on APPs in clinical practice.    CC:  Chief Complaint  Patient presents with   Follow-up   Hypertension   Diabetes   Hyperlipidemia    HPI Ian Leblanc presents for hypertension, DM, hyperlipidemia States he has an upcoming flight to [place] and would like to discuss flying anxiety and prevention of jet lag/ sleeplessness  Hypertension: Elevated in office. He is taking his BP at home- usually running in 130s/80s Denies headaches, lightheadedness, vision changes, chest pain.   Diabetes: States blood glucose is being checked at home but can't remember averages. Currently on Metformin - denies concerns for side effects  Hyperlipidemia: states he is trying to reduce saturated fat intake. Recently bought an airfryer to help with this.   Diet: states he is trying to watch what he eats but is not really dieting.  States he is trying to run 3 days a week - ran two 5ks over the previous week Has a goal to reduce weight by at least 15 lbs - reports he is trying to hike more often.    Flight concerns: reports he has flown many times and is not usually anxious about it. He is more concerned for the fact that it is a 14 hour flight and may not sleep well while on flight States he would like options to assist with relaxing while on the flight and hopefully something to help with sleep.   Past Medical History:  Diagnosis Date   Abnormal EKG 07/16/2013   Arthritis    Cancer ([place])    bladder   Cervical spine disease 02/04/2015   Chest pain 07/16/2013   Diabetes mellitus without complication ([place])    History of kidney stones    Hyperglycemia    Hyperlipidemia    Hypertension    Neck pain on left side 02/04/2015   Numbness of fingers 02/04/2015    Paresthesia of left arm 02/04/2015   Screening for prostate cancer 12/09/2014   Sessile colonic polyp     Past Surgical History:  Procedure Laterality Date   BACK SURGERY  2016   cyst removal. no metal in his back   TRANSURETHRAL RESECTION OF BLADDER TUMOR N/A 01/10/2021   Procedure: TRANSURETHRAL RESECTION OF BLADDER TUMOR (TURBT) WITH GEMCITABINE;  Surgeon: Hollice Espy, MD;  Location: [facility] ORS;  Service: Urology;  Laterality: N/A;    Family History  Problem Relation Age of Onset   Prostate cancer Paternal Grandfather     Social History   Socioeconomic History   Marital status: Married    Spouse name: Clarene Critchley   Number of children: Not on file   Years of education: Not on file   Highest education level: Not on file  Occupational History   Occupation: Tree surgeon, Surveyor, minerals  Tobacco Use   Smoking status: Former    Packs/day: 0.50    Types: Cigarettes    Quit date: 07/17/2003    Years since quitting: 18.2   Smokeless tobacco: Never  Vaping Use   Vaping Use: Never used  Substance and Sexual Activity   Alcohol use: No    Comment: beer every now and then.   Drug use: No   Sexual activity: Yes  Other Topics Concern   Not on file  Social  History Narrative   Patient lives with wife only. Feels safe in his home.    Social Determinants of Health   Financial Resource Strain: Not on file  Food Insecurity: Not on file  Transportation Needs: Not on file  Physical Activity: Not on file  Stress: Not on file  Social Connections: Not on file  Intimate Partner Violence: Not on file    Outpatient Medications Prior to Visit  Medication Sig Dispense Refill   acetaminophen (TYLENOL) 500 MG tablet Take 500 mg by mouth every 6 (six) hours as needed.     aspirin 81 MG tablet Take 81 mg by mouth daily.     atorvastatin (LIPITOR) 80 MG tablet Take 1 tablet (80 mg total) by mouth at bedtime. 90 tablet 3   benazepril (LOTENSIN) 20 MG tablet Take 1 tablet (20 mg total)  by mouth daily. 90 tablet 1   BINAXNOW COVID-19 AG HOME TEST KIT Use as Directed on the Package     cetirizine (ZYRTEC) 10 MG tablet Take 1 tablet (10 mg total) by mouth daily. 30 tablet 11   fluticasone (FLONASE) 50 MCG/ACT nasal spray Place 2 sprays into both nostrils daily. 16 g 6   levocetirizine (XYZAL) 5 MG tablet TAKE 1 TABLET BY MOUTH ONCE DAILY IN THE EVENING 90 tablet 0   metFORMIN (GLUCOPHAGE) 500 MG tablet TAKE 1 TABLET BY MOUTH TWICE DAILY WITH  A  MEAL 180 tablet 0   Multiple Vitamins-Minerals (MULTIVITAMIN PO) Take 1 tablet by mouth daily. Over 50 mvi qd     tadalafil (CIALIS) 5 MG tablet Take 5 mg by mouth daily.     benzonatate (TESSALON) 100 MG capsule Take 1 capsule (100 mg total) by mouth 3 (three) times daily as needed for cough. (Patient not taking: Reported on 10/12/2021) 30 capsule 0   No facility-administered medications prior to visit.    Allergies  Allergen Reactions   Crestor [Rosuvastatin] Other (See Comments)    Severe myalgias    ROS Review of Systems  Constitutional:  Negative for activity change, fatigue and fever.  Eyes:  Negative for visual disturbance.  Respiratory:  Negative for cough and shortness of breath.   Cardiovascular:  Negative for chest pain and palpitations.  Musculoskeletal:  Negative for arthralgias, back pain and myalgias.  Skin:  Negative for color change and rash.  Neurological:  Negative for dizziness, tremors, syncope, weakness, light-headedness, numbness and headaches.  Psychiatric/Behavioral:  Negative for confusion and dysphoric mood. The patient is not nervous/anxious.      Objective:    Physical Exam Vitals reviewed.  Constitutional:      Appearance: Normal appearance.  HENT:     Head: Normocephalic and atraumatic.  Eyes:     General: Lids are normal.     Extraocular Movements: Extraocular movements intact.     Conjunctiva/sclera: Conjunctivae normal.     Pupils: Pupils are equal, round, and reactive to light.   Cardiovascular:     Rate and Rhythm: Normal rate and regular rhythm.     Pulses: Normal pulses.          Dorsalis pedis pulses are 2+ on the right side and 2+ on the left side.     Heart sounds: Normal heart sounds.  Pulmonary:     Effort: Pulmonary effort is normal.     Breath sounds: Normal breath sounds. No decreased breath sounds, wheezing, rhonchi or rales.  Musculoskeletal:     Right lower leg: No edema.     Left  lower leg: No edema.     Right foot: Normal range of motion.     Left foot: Normal range of motion.       Feet:  Feet:     Right foot:     Skin integrity: Skin integrity normal.     Toenail Condition: Right toenails are normal.     Left foot:     Skin integrity: Skin integrity normal.     Toenail Condition: Left toenails are normal.  Lymphadenopathy:     Head:     Right side of head: No submental or submandibular adenopathy.     Left side of head: No submental or submandibular adenopathy.     Upper Body:     Right upper body: No supraclavicular adenopathy.     Left upper body: No supraclavicular adenopathy.  Neurological:     General: No focal deficit present.     Mental Status: He is alert and oriented to person, place, and time.  Psychiatric:        Attention and Perception: Attention normal.        Mood and Affect: Mood normal.        Speech: Speech normal.        Behavior: Behavior normal. Behavior is cooperative.        Thought Content: Thought content normal.        Cognition and Memory: Cognition normal.        Judgment: Judgment normal.    BP (!) 142/86    Pulse 60    Temp 98.2 F (36.8 C) (Oral)    Resp 16    Ht _0  (1.778 m)    Wt 227 lb 3.2 oz (103.1 kg)    SpO2 97%    BMI 32.60 kg/m  Wt Readings from Last 3 Encounters:  10/12/21 227 lb 3.2 oz (103.1 kg)  05/17/21 222 lb 14.4 oz (101.1 kg)  12/31/20 217 lb (98.4 kg)     Health Maintenance Due  Topic Date Due   OPHTHALMOLOGY EXAM  10/19/2020    There are no preventive care  reminders to display for this patient.  No results found for: TSH Lab Results  Component Value Date   WBC 6.7 05/17/2021   HGB 14.9 05/17/2021   HCT 43.9 05/17/2021   MCV 87.3 05/17/2021   PLT 175 05/17/2021   Lab Results  Component Value Date   NA 141 05/17/2021   K 4.0 05/17/2021   CO2 25 05/17/2021   GLUCOSE 75 05/17/2021   BUN 13 05/17/2021   CREATININE 0.75 05/17/2021   BILITOT 1.0 05/17/2021   ALKPHOS 68 07/05/2016   AST 28 05/17/2021   ALT 50 (H) 05/17/2021   PROT 6.6 05/17/2021   ALBUMIN 4.6 07/05/2016   CALCIUM 9.3 05/17/2021   ANIONGAP 9 01/05/2021   EGFR 102 05/17/2021   Lab Results  Component Value Date   CHOL 158 05/17/2021   Lab Results  Component Value Date   HDL 41 05/17/2021   Lab Results  Component Value Date   LDLCALC 98 05/17/2021   Lab Results  Component Value Date   TRIG 97 05/17/2021   Lab Results  Component Value Date   CHOLHDL 3.9 05/17/2021   Lab Results  Component Value Date   HGBA1C 5.5 05/17/2021      Assessment & Plan:   Problem List Items Addressed This Visit       Cardiovascular and Mediastinum   HTN (hypertension) (Chronic)    Chronic,  historic conditio, overall stable with elevations from coming to office Recommend monitoring at home as able to discern between transient elevations vs sustained Continue current medications at this time. Continue exercise regimen and incorporate heart healthy diet  Follow up in 6 months for chronic condition management       Relevant Orders   CBC w/Diff/Platelet     Endocrine   Diabetes mellitus type 2, uncomplicated (HCC)   Relevant Orders   Comprehensive Metabolic Panel (CMET)   HgB A1c   Lipid Profile   Controlled type 2 diabetes mellitus with hyperglycemia, without long-term current use of insulin (HCC) - Primary    Chronic, historic condition, stable and appears well managed on current medications Recommend he continue to stay active and keep exercising  Discussed  diet role in assisting with management Labs to dictate further management Continue current medications at this time Recommend follow up in 6 months         Other   HLD (hyperlipidemia) (Chronic)    Chronic, historic condition Appears stable and well managed with current medications Recommend continued exercise and dietary efforts to assist with further management in addition to medications Lab results to dictate further management Follow up in 6 months for monitoring       Relevant Orders   Lipid Profile     Return in about 6 months (around 04/11/2022) for chronic condition management.   I, Courtany Mcmurphy E Chole Driver, PA-C, have reviewed all documentation for this visit. The documentation on 10/12/21 for the exam, diagnosis, procedures, and orders are all accurate and complete.   Adien Kimmel, Glennie Isle MPH [facility]

## 2021-10-12 NOTE — Assessment & Plan Note (Signed)
Chronic, historic condition Appears stable and well managed with current medications Recommend continued exercise and dietary efforts to assist with further management in addition to medications Lab results to dictate further management Follow up in 6 months for monitoring

## 2021-10-13 LAB — LIPID PANEL
Cholesterol: 153 mg/dL (ref <200)
HDL: 42 mg/dL (ref >=40)
LDL Cholesterol (Calc): 94 mg/dL (calc)
Non-HDL Cholesterol (Calc): 111 mg/dL (calc) (ref <130)
Total CHOL/HDL Ratio: 3.6 (calc) (ref <5.0)
Triglycerides: 83 mg/dL (ref <150)

## 2021-10-13 LAB — COMPREHENSIVE METABOLIC PANEL
AG Ratio: 2.3 (calc) (ref 1.0–2.5)
ALT: 42 U/L (ref 9–46)
AST: 25 U/L (ref 10–35)
Albumin: 4.5 g/dL (ref 3.6–5.1)
Alkaline phosphatase (APISO): 67 U/L (ref 35–144)
BUN/Creatinine Ratio: 15 (calc) (ref 6–22)
BUN: 10 mg/dL (ref 7–25)
CO2: 27 mmol/L (ref 20–32)
Calcium: 9.4 mg/dL (ref 8.6–10.3)
Chloride: 107 mmol/L (ref 98–110)
Creat: 0.66 mg/dL — ABNORMAL LOW (ref 0.70–1.35)
Globulin: 2 g/dL (calc) (ref 1.9–3.7)
Glucose, Bld: 118 mg/dL — ABNORMAL HIGH (ref 65–99)
Potassium: 4.6 mmol/L (ref 3.5–5.3)
Sodium: 139 mmol/L (ref 135–146)
Total Bilirubin: 0.7 mg/dL (ref 0.2–1.2)
Total Protein: 6.5 g/dL (ref 6.1–8.1)

## 2021-10-13 LAB — CBC WITH DIFFERENTIAL/PLATELET
Absolute Monocytes: 462 cells/uL (ref 200–950)
Basophils Absolute: 68 cells/uL (ref 0–200)
Basophils Relative: 1.2 %
Eosinophils Absolute: 120 cells/uL (ref 15–500)
Eosinophils Relative: 2.1 %
HCT: 42.4 % (ref 38.5–50.0)
Hemoglobin: 14.4 g/dL (ref 13.2–17.1)
Lymphs Abs: 1568 cells/uL (ref 850–3900)
MCH: 29.9 pg (ref 27.0–33.0)
MCHC: 34 g/dL (ref 32.0–36.0)
MCV: 88.1 fL (ref 80.0–100.0)
MPV: 12.2 fL (ref 7.5–12.5)
Monocytes Relative: 8.1 %
Neutro Abs: 3483 cells/uL (ref 1500–7800)
Neutrophils Relative %: 61.1 %
Platelets: 170 10*3/uL (ref 140–400)
RBC: 4.81 10*6/uL (ref 4.20–5.80)
RDW: 12.3 % (ref 11.0–15.0)
Total Lymphocyte: 27.5 %
WBC: 5.7 10*3/uL (ref 3.8–10.8)

## 2021-10-13 LAB — HEMOGLOBIN A1C
Hgb A1c MFr Bld: 5.7 % of total Hgb — ABNORMAL HIGH (ref <5.7)
Mean Plasma Glucose: 117 mg/dL
eAG (mmol/L): 6.5 mmol/L

## 2021-10-17 NOTE — Progress Notes (Signed)
° °  10/18/2021 CC:  Chief Complaint  Patient presents with   Cysto     HPI: Ian Leblanc is a 61 y.o. male with a personal history of kidney stones, gross hematuria with negative workup, bladder cancer, who presents today for 6 month surveillance cystoscopy.   CT urogram on 12/02/2020 showed 1 small area within the bladder, several millimeters in size near the dome which may recheck present filling defect. Otherwise there were no additional upper tract pathology identified. Small incidental pulmonary nodule felt to be likely benign.    He underwent a transurethral resection of bladder tumor with Gemcitabine for malignant neoplasm of lateral wall of urinary bladder on 01/10/2021.    Surgical pathology revealed urinary bladder; transurethral resection: non invasive papillary urothelial carcinoma, low-grade. No muscularis propria identified.   He had a cystoscopy on 04/13/2021 which was NED and given his lack of recurrence cystoscopy was switched to every 6 months.   He has no new urinary symptoms.  Patient denies any modifying or aggravating factors.  Patient denies any gross hematuria, dysuria or suprapubic/flank pain. Patient denies any fevers, chills, nausea or vomiting.   Vitals:   10/18/21 0857  BP: (!) 147/83  Pulse: 13   NED. A&Ox3.   No respiratory distress   Abd soft, NT, ND Normal phallus with bilateral descended testicles  Cystoscopy Procedure Note  Patient identification was confirmed, informed consent was obtained, and patient was prepped using Betadine solution.  Lidocaine jelly was administered per urethral meatus.     Pre-Procedure: - Inspection reveals a normal caliber ureteral meatus.  Procedure: The flexible cystoscope was introduced without difficulty - No urethral strictures/lesions are present. - Normal prostate  -Slighly Elevated bladder neck - Bilateral ureteral orifices identified - Bladder mucosa  reveals no ulcers, tumors, or lesions - No  bladder stones - No trabeculation - stellate scare on left lateral bladder neck area and just proximal a 5 mm area of texture change without any obvious papillary growth   Retroflexion shows  a 5 mm area of texture change without any obvious papillary growth     Post-Procedure: - Patient tolerated the procedure well  Assessment/ Plan:  History of bladder cancer  - lesion today concern for inflammation versus early reoccurrence  - Generally would recommend 6 months cystoscopy but due to area will have him follow-up with a cystoscopy in 3 months to monitor the area more closely  I,Kailey Littlejohn,acting as a scribe for Hollice Espy, MD.,have documented all relevant documentation on the behalf of Hollice Espy, MD,as directed by  Hollice Espy, MD while in the presence of Hollice Espy, MD.  I have reviewed the above documentation for accuracy and completeness, and I agree with the above.   Hollice Espy, MD

## 2021-10-18 ENCOUNTER — Encounter: Payer: Self-pay | Admitting: Urology

## 2021-10-18 ENCOUNTER — Ambulatory Visit: Payer: BC Managed Care – PPO | Admitting: Urology

## 2021-10-18 ENCOUNTER — Other Ambulatory Visit: Payer: Self-pay

## 2021-10-18 ENCOUNTER — Other Ambulatory Visit: Payer: BC Managed Care – PPO | Admitting: Urology

## 2021-10-18 VITALS — BP 147/83 | HR 67 | Ht 70.0 in | Wt 227.0 lb

## 2021-10-18 DIAGNOSIS — C672 Malignant neoplasm of lateral wall of bladder: Secondary | ICD-10-CM

## 2021-10-18 LAB — MICROSCOPIC EXAMINATION
Bacteria, UA: NONE SEEN
Epithelial Cells (non renal): NONE SEEN /hpf (ref 0–10)

## 2021-10-18 LAB — URINALYSIS, COMPLETE
Bilirubin, UA: NEGATIVE
Glucose, UA: NEGATIVE
Ketones, UA: NEGATIVE
Leukocytes,UA: NEGATIVE
Nitrite, UA: NEGATIVE
Protein,UA: NEGATIVE
RBC, UA: NEGATIVE
Specific Gravity, UA: 1.015 (ref 1.005–1.030)
Urobilinogen, Ur: 0.2 mg/dL (ref 0.2–1.0)
pH, UA: 7 (ref 5.0–7.5)

## 2021-11-14 ENCOUNTER — Ambulatory Visit: Payer: BC Managed Care – PPO | Admitting: Family Medicine

## 2021-12-09 ENCOUNTER — Other Ambulatory Visit: Payer: Self-pay | Admitting: Family Medicine

## 2021-12-09 DIAGNOSIS — J309 Allergic rhinitis, unspecified: Secondary | ICD-10-CM

## 2021-12-14 DIAGNOSIS — H35033 Hypertensive retinopathy, bilateral: Secondary | ICD-10-CM | POA: Diagnosis not present

## 2021-12-14 DIAGNOSIS — H2513 Age-related nuclear cataract, bilateral: Secondary | ICD-10-CM | POA: Diagnosis not present

## 2021-12-14 DIAGNOSIS — B029 Zoster without complications: Secondary | ICD-10-CM | POA: Diagnosis not present

## 2021-12-14 DIAGNOSIS — E119 Type 2 diabetes mellitus without complications: Secondary | ICD-10-CM | POA: Diagnosis not present

## 2021-12-14 LAB — HM DIABETES EYE EXAM

## 2022-01-17 ENCOUNTER — Other Ambulatory Visit: Payer: Self-pay | Admitting: Urology

## 2022-01-17 ENCOUNTER — Telehealth: Payer: Self-pay

## 2022-01-17 ENCOUNTER — Ambulatory Visit: Payer: BC Managed Care – PPO | Admitting: Urology

## 2022-01-17 ENCOUNTER — Encounter: Payer: Self-pay | Admitting: Urology

## 2022-01-17 VITALS — BP 163/101 | HR 65 | Ht 70.0 in | Wt 216.0 lb

## 2022-01-17 DIAGNOSIS — D494 Neoplasm of unspecified behavior of bladder: Secondary | ICD-10-CM

## 2022-01-17 DIAGNOSIS — C672 Malignant neoplasm of lateral wall of bladder: Secondary | ICD-10-CM

## 2022-01-17 NOTE — Progress Notes (Signed)
[facility] Posting Form   Surgery Date/Time: Date: 01/26/2022  Surgeon: Dr. Hollice Espy, MD  Surgery Location: Day Surgery  Inpt ( No  )   Outpt (Yes)   Obs ( No  )   Diagnosis: D49.4 Bladder Tumor   -CPT: 37943, 27614, [phone]  Surgery: Transurethral Resection of Bladder Tumor with intravesical instillation of Gemcitabine and bilateral retrograde pyelograms  Stop Anticoagulations: Yes, may continue ASA  Cardiac/Medical/Pulmonary Clearance needed: no  *Orders entered into EPIC  Date: 01/17/22   *Case booked in [place]  Date: 01/17/22  *Notified pt of Surgery: Date: 01/17/22  PRE-OP UA & CX: no  *Placed into Prior Authorization Work Que Date: 01/17/22   Assistant/laser/rep:No

## 2022-01-17 NOTE — Progress Notes (Signed)
Surgical Physician Order Form [facility] [place]  * Scheduling expectation : Next Available  *Length of Case:   *Clearance needed: no  *Anticoagulation Instructions: Hold all anticoagulants  *Aspirin Instructions: Ok to continue Aspirin  *Post-op visit Date/Instructions:  3 month follow up for cystoscopy  *Diagnosis: Bladder Tumor  *Procedure: bilateral retrograde pyelogram, instillation of intravesical gemcitabine ,TURBT <2cm (09470)   Additional orders: Gemcitabine '2000mg'$  bladder instillation  -Admit type: OUTpatient  -Anesthesia: General  -VTE Prophylaxis Standing Order SCD's       Other:   -Standing Lab Orders Per Anesthesia    Lab other: None  -Standing Test orders EKG/Chest x-ray per Anesthesia       Test other:   - Medications:  Ancef 2gm IV  -Other orders:  N/A

## 2022-01-17 NOTE — Telephone Encounter (Signed)
I spoke with Mr. Riviello. We have discussed possible surgery dates and Thursday June 8th, 2023 was agreed upon by all parties. Patient given information about surgery date, what to expect pre-operatively and post operatively.  We discussed that a Pre-Admission Testing office will be calling to set up the pre-op visit that will take place prior to surgery, and that these appointments are typically done over the phone with a Pre-Admissions RN.  Informed patient that our office will communicate any additional care to be provided after surgery. Patients questions or concerns were discussed during our call. Advised to call our office should there be any additional information, questions or concerns that arise. Patient verbalized understanding.

## 2022-01-17 NOTE — Progress Notes (Signed)
   01/17/22 CC:     HPI: Ian Leblanc is a 61 y.o. male with a personal history of kidney stones, gross hematuria with negative workup, bladder cancer, who presents today for 3 month surveillance cystoscopy.   CT urogram on 12/02/2020 showed 1 small area within the bladder, several millimeters in size near the dome which may recheck present filling defect. Otherwise there were no additional upper tract pathology identified. Small incidental pulmonary nodule felt to be likely benign.    He underwent a transurethral resection of bladder tumor with Gemcitabine for malignant neoplasm of lateral wall of urinary bladder on 01/10/2021.    Surgical pathology revealed urinary bladder; transurethral resection: non invasive papillary urothelial carcinoma, low-grade. No muscularis propria identified.   Her most recent cystoscopy on 10/18/2021 revealed a 5 mm area of texture change without any obvious papillary growth.   Vitals:   01/17/22 0858  BP: (!) 163/101  Pulse: 65   NED. A&Ox3.   No respiratory distress   Abd soft, NT, ND Normal phallus with bilateral descended testicles  Cystoscopy Procedure Note  Patient identification was confirmed, informed consent was obtained, and patient was prepped using Betadine solution.  Lidocaine jelly was administered per urethral meatus.     Pre-Procedure: - Inspection reveals a normal caliber ureteral meatus.  Procedure: The flexible cystoscope was introduced without difficulty - No urethral strictures/lesions are present. - Normal prostate  - Slightly Elevated bladder neck - Bilateral ureteral orifices identified - Bladder mucosa  reveals no ulcers, tumors, or lesions - No bladder stones - No trabeculation - stellate scare on left lateral bladder neck area and just proximal a 1 cm area papillary in nature   Retroflexion shows on left lateral bladder neck tumor   Post-Procedure: - Patient tolerated the procedure well   Assessment/  Plan:  History of bladder cancer / bladder cancer recurrence - recurrent bladder cancer on cystoscopy today   - Small recurrence on left bladder   - Recommend TURBT, bilateral retrogrades and intravesical gemcitabine. Risk and benefits discussed. He is familiar with this procedure.  -Preop urine culture today   I,Kailey Littlejohn,acting as a scribe for Hollice Espy, MD.,have documented all relevant documentation on the behalf of Hollice Espy, MD,as directed by  Hollice Espy, MD while in the presence of Hollice Espy, MD.  I have reviewed the above documentation for accuracy and completeness, and I agree with the above.   Hollice Espy, MD

## 2022-01-17 NOTE — H&P (View-Only) (Signed)
   01/17/22 CC:     HPI: Ian Leblanc is a 61 y.o. male with a personal history of kidney stones, gross hematuria with negative workup, bladder cancer, who presents today for 3 month surveillance cystoscopy.   CT urogram on 12/02/2020 showed 1 small area within the bladder, several millimeters in size near the dome which may recheck present filling defect. Otherwise there were no additional upper tract pathology identified. Small incidental pulmonary nodule felt to be likely benign.    He underwent a transurethral resection of bladder tumor with Gemcitabine for malignant neoplasm of lateral wall of urinary bladder on 01/10/2021.    Surgical pathology revealed urinary bladder; transurethral resection: non invasive papillary urothelial carcinoma, low-grade. No muscularis propria identified.   Her most recent cystoscopy on 10/18/2021 revealed a 5 mm area of texture change without any obvious papillary growth.   Vitals:   01/17/22 0858  BP: (!) 163/101  Pulse: 65   NED. A&Ox3.   No respiratory distress   Abd soft, NT, ND Normal phallus with bilateral descended testicles  Cystoscopy Procedure Note  Patient identification was confirmed, informed consent was obtained, and patient was prepped using Betadine solution.  Lidocaine jelly was administered per urethral meatus.     Pre-Procedure: - Inspection reveals a normal caliber ureteral meatus.  Procedure: The flexible cystoscope was introduced without difficulty - No urethral strictures/lesions are present. - Normal prostate  - Slightly Elevated bladder neck - Bilateral ureteral orifices identified - Bladder mucosa  reveals no ulcers, tumors, or lesions - No bladder stones - No trabeculation - stellate scare on left lateral bladder neck area and just proximal a 1 cm area papillary in nature   Retroflexion shows on left lateral bladder neck tumor   Post-Procedure: - Patient tolerated the procedure well   Assessment/  Plan:  History of bladder cancer / bladder cancer recurrence - recurrent bladder cancer on cystoscopy today   - Small recurrence on left bladder   - Recommend TURBT, bilateral retrogrades and intravesical gemcitabine. Risk and benefits discussed. He is familiar with this procedure.  -Preop urine culture today   I,Kailey Littlejohn,acting as a scribe for Hollice Espy, MD.,have documented all relevant documentation on the behalf of Hollice Espy, MD,as directed by  Hollice Espy, MD while in the presence of Hollice Espy, MD.  I have reviewed the above documentation for accuracy and completeness, and I agree with the above.   Hollice Espy, MD

## 2022-01-18 LAB — MICROSCOPIC EXAMINATION

## 2022-01-18 LAB — URINALYSIS, COMPLETE
Bilirubin, UA: NEGATIVE
Glucose, UA: NEGATIVE
Leukocytes,UA: NEGATIVE
Nitrite, UA: NEGATIVE
Protein,UA: NEGATIVE
Specific Gravity, UA: 1.025 (ref 1.005–1.030)
Urobilinogen, Ur: 0.2 mg/dL (ref 0.2–1.0)
pH, UA: 6 (ref 5.0–7.5)

## 2022-01-20 ENCOUNTER — Other Ambulatory Visit: Payer: Self-pay | Admitting: Family Medicine

## 2022-01-20 DIAGNOSIS — E1165 Type 2 diabetes mellitus with hyperglycemia: Secondary | ICD-10-CM

## 2022-01-20 DIAGNOSIS — I10 Essential (primary) hypertension: Secondary | ICD-10-CM

## 2022-01-20 LAB — CULTURE, URINE COMPREHENSIVE

## 2022-01-25 ENCOUNTER — Encounter
Admission: RE | Admit: 2022-01-25 | Discharge: 2022-01-25 | Disposition: A | Discharge status: Home / Self Care | Payer: BC Managed Care – PPO | Source: Ambulatory Visit | Attending: Urology | Admitting: Urology

## 2022-01-25 ENCOUNTER — Other Ambulatory Visit: Payer: Self-pay

## 2022-01-25 ENCOUNTER — Encounter: Payer: Self-pay | Admitting: Urgent Care

## 2022-01-25 VITALS — Wt 227.3 lb

## 2022-01-25 DIAGNOSIS — E119 Type 2 diabetes mellitus without complications: Secondary | ICD-10-CM | POA: Insufficient documentation

## 2022-01-25 DIAGNOSIS — C679 Malignant neoplasm of bladder, unspecified: Secondary | ICD-10-CM | POA: Diagnosis not present

## 2022-01-25 DIAGNOSIS — Z01818 Encounter for other preprocedural examination: Secondary | ICD-10-CM | POA: Insufficient documentation

## 2022-01-25 DIAGNOSIS — Z87891 Personal history of nicotine dependence: Secondary | ICD-10-CM | POA: Diagnosis not present

## 2022-01-25 DIAGNOSIS — Z7984 Long term (current) use of oral hypoglycemic drugs: Secondary | ICD-10-CM | POA: Diagnosis not present

## 2022-01-25 DIAGNOSIS — I10 Essential (primary) hypertension: Secondary | ICD-10-CM

## 2022-01-25 LAB — BASIC METABOLIC PANEL
Anion gap: 6 (ref 5–15)
BUN: 9 mg/dL (ref 8–23)
CO2: 23 mmol/L (ref 22–32)
Calcium: 9.2 mg/dL (ref 8.9–10.3)
Chloride: 108 mmol/L (ref 98–111)
Creatinine, Ser: 0.66 mg/dL (ref 0.61–1.24)
GFR, Estimated: >60 mL/min (ref >60)
Glucose, Bld: 96 mg/dL (ref 70–99)
Potassium: 4 mmol/L (ref 3.5–5.1)
Sodium: 137 mmol/L (ref 135–145)

## 2022-01-25 LAB — CBC
HCT: 43.6 % (ref 39.0–52.0)
Hemoglobin: 15 g/dL (ref 13.0–17.0)
MCH: 29.1 pg (ref 26.0–34.0)
MCHC: 34.4 g/dL (ref 30.0–36.0)
MCV: 84.7 fL (ref 80.0–100.0)
Platelets: 173 10*3/uL (ref 150–400)
RBC: 5.15 MIL/uL (ref 4.22–5.81)
RDW: 12.4 % (ref 11.5–15.5)
WBC: 6.2 10*3/uL (ref 4.0–10.5)
nRBC: 0 % (ref 0.0–0.2)

## 2022-01-25 MED ORDER — ORAL CARE MOUTH RINSE: 15.0000 mL | Freq: Once | OROMUCOSAL | Status: AC

## 2022-01-25 MED ORDER — GEMCITABINE CHEMO FOR BLADDER INSTILLATION 2000 MG
2000.0000 mg | Freq: Once | INTRAVENOUS | Status: DC
  Filled 2022-01-25: qty 52.6

## 2022-01-25 MED ORDER — CHLORHEXIDINE GLUCONATE 0.12 % MT SOLN: 15.0000 mL | Freq: Once | OROMUCOSAL | Status: AC

## 2022-01-25 MED ORDER — SODIUM CHLORIDE 0.9 % IV SOLN: INTRAVENOUS | Status: DC

## 2022-01-25 MED ORDER — FAMOTIDINE 20 MG PO TABS: 20.0000 mg | ORAL_TABLET | Freq: Once | ORAL | Status: AC

## 2022-01-25 MED ORDER — CEFAZOLIN SODIUM-DEXTROSE 2-4 GM/100ML-% IV SOLN
2.0000 g | INTRAVENOUS | Status: AC
  Administered 2022-01-26: 2 g via INTRAVENOUS

## 2022-01-25 NOTE — Patient Instructions (Addendum)
Your procedure is scheduled on: 01/26/2022  Report to the Registration Desk on the 1st floor of the [facility]. To find out your arrival time, please call [phone] between 1PM - 3PM on:  01/25/2022  If your arrival time is 6:00 am, do not arrive prior to that time as the [facility] entrance doors do not open until 6:00 am.  REMEMBER: Instructions that are not followed completely may result in serious medical risk, up to and including death; or upon the discretion of your surgeon and anesthesiologist your surgery may need to be rescheduled.  Do not eat food after midnight the night before surgery.  No gum chewing, lozengers or hard candies.     **Follow new guidelines for insulin and diabetes medications.** Do not take metformin today, 01/25/2022  and on am of surgery 01/26/2022    Follow recommendations from Cardiologist, Pulmonologist or PCP regarding stopping Aspirin.  One week prior to surgery: Stop Anti-inflammatories (NSAIDS) such as Advil, Aleve, Ibuprofen, Motrin, Naproxen, Naprosyn and Aspirin based products such as Excedrin, Goodys Powder, BC Powder. Stop ANY OVER THE COUNTER supplements until after surgery like multivitamins. You may however, continue to take Tylenol if needed for pain up until the day of surgery.  No Alcohol for 24 hours before or after surgery.  No Smoking including e-cigarettes for 24 hours prior to surgery.  No chewable tobacco products for at least 6 hours prior to surgery.  No nicotine patches on the day of surgery.  Do not use any "recreational" drugs for at least a week prior to your surgery.  Please be advised that the combination of cocaine and anesthesia may have negative outcomes, up to and including death. If you test positive for cocaine, your surgery will be cancelled.  On the morning of surgery brush your teeth with toothpaste and water, you may rinse your mouth with mouthwash if you wish. Do not swallow any toothpaste or  mouthwash.  You need to shower the day of surgery .  Do not wear jewelry, make-up, hairpins, clips or nail polish.  Do not wear lotions, creams, ointments, powders, or perfumes /deodorant.  Do not shave body from the neck down 48 hours prior to surgery just in case you cut yourself which could leave a site for infection.  Also, freshly shaved skin may become irritated if using the CHG soap.  Contact lenses, hearing aids and dentures may not be worn into surgery.  Do not bring valuables to the hospital. [facility] is not responsible for any missing/lost belongings or valuables.    Notify your doctor if there is any change in your medical condition (cold, fever, infection).  Wear comfortable clothing (specific to your surgery type) to the hospital.  After surgery, you can help prevent lung complications by doing breathing exercises.  Take deep breaths and cough every 1-2 hours. Your doctor may order a device called an Incentive Spirometer to help you take deep breaths.   If you are being admitted to the hospital overnight, leave your suitcase in the car. After surgery it may be brought to your room.  If you are being discharged the day of surgery, you will not be allowed to drive home. You will need a responsible adult (18 years or older) to drive you home and stay with you that night.   If you are taking public transportation, you will need to have a responsible adult (18 years or older) with you. Please confirm with your physician that it is acceptable  to use public transportation.   Please call the [facility] at [phone] if you have any questions about these instructions.  Surgery Visitation Policy:  Patients undergoing a surgery or procedure may have two family members or support persons with them as long as the person is not COVID-19 positive or experiencing its symptoms.

## 2022-01-26 ENCOUNTER — Ambulatory Visit: Payer: BC Managed Care – PPO

## 2022-01-26 ENCOUNTER — Encounter
Admission: RE | Disposition: A | Discharge status: Home / Self Care | Payer: Self-pay | Source: Home / Self Care | Attending: Urology

## 2022-01-26 ENCOUNTER — Ambulatory Visit: Payer: BC Managed Care – PPO | Admitting: Certified Registered"

## 2022-01-26 ENCOUNTER — Encounter: Payer: Self-pay | Admitting: Urology

## 2022-01-26 ENCOUNTER — Ambulatory Visit
Admission: RE | Admit: 2022-01-26 | Discharge: 2022-01-26 | Disposition: A | Discharge status: Home / Self Care | Payer: BC Managed Care – PPO | Attending: Urology | Admitting: Urology

## 2022-01-26 ENCOUNTER — Other Ambulatory Visit: Payer: Self-pay

## 2022-01-26 DIAGNOSIS — E119 Type 2 diabetes mellitus without complications: Secondary | ICD-10-CM | POA: Diagnosis not present

## 2022-01-26 DIAGNOSIS — I10 Essential (primary) hypertension: Secondary | ICD-10-CM | POA: Diagnosis not present

## 2022-01-26 DIAGNOSIS — D494 Neoplasm of unspecified behavior of bladder: Secondary | ICD-10-CM

## 2022-01-26 DIAGNOSIS — Z87891 Personal history of nicotine dependence: Secondary | ICD-10-CM | POA: Insufficient documentation

## 2022-01-26 DIAGNOSIS — C679 Malignant neoplasm of bladder, unspecified: Secondary | ICD-10-CM | POA: Insufficient documentation

## 2022-01-26 DIAGNOSIS — Z7984 Long term (current) use of oral hypoglycemic drugs: Secondary | ICD-10-CM | POA: Insufficient documentation

## 2022-01-26 HISTORY — PX: TRANSURETHRAL RESECTION OF BLADDER TUMOR: SHX2575

## 2022-01-26 HISTORY — PX: BLADDER INSTILLATION: SHX6893

## 2022-01-26 HISTORY — PX: CYSTOSCOPY W/ RETROGRADES: SHX1426

## 2022-01-26 LAB — GLUCOSE, CAPILLARY
Glucose-Capillary: 124 mg/dL — ABNORMAL HIGH (ref 70–99)
Glucose-Capillary: 122 mg/dL — ABNORMAL HIGH (ref 70–99)

## 2022-01-26 SURGERY — TURBT (TRANSURETHRAL RESECTION OF BLADDER TUMOR)
Anesthesia: General

## 2022-01-26 MED ORDER — FAMOTIDINE 20 MG PO TABS
ORAL_TABLET | ORAL | Status: AC
  Administered 2022-01-26: 20 mg via ORAL
  Filled 2022-01-26: qty 1

## 2022-01-26 MED ORDER — LIDOCAINE HCL (CARDIAC) PF 100 MG/5ML IV SOSY
PREFILLED_SYRINGE | INTRAVENOUS | Status: DC | PRN
  Administered 2022-01-26: 100 mg via INTRAVENOUS

## 2022-01-26 MED ORDER — KETOROLAC TROMETHAMINE 30 MG/ML IJ SOLN
INTRAMUSCULAR | Status: DC | PRN
  Administered 2022-01-26: 30 mg via INTRAVENOUS

## 2022-01-26 MED ORDER — FENTANYL CITRATE (PF) 100 MCG/2ML IJ SOLN
INTRAMUSCULAR | Status: AC
  Filled 2022-01-26: qty 2

## 2022-01-26 MED ORDER — ACETAMINOPHEN 10 MG/ML IV SOLN
INTRAVENOUS | Status: AC
  Filled 2022-01-26: qty 100

## 2022-01-26 MED ORDER — ACETAMINOPHEN 10 MG/ML IV SOLN
INTRAVENOUS | Status: DC | PRN
  Administered 2022-01-26: 1000 mg via INTRAVENOUS

## 2022-01-26 MED ORDER — STERILE WATER FOR IRRIGATION IR SOLN
Status: DC | PRN
  Administered 2022-01-26: 1000 mL

## 2022-01-26 MED ORDER — PROPOFOL 10 MG/ML IV BOLUS
INTRAVENOUS | Status: AC
  Filled 2022-01-26: qty 20

## 2022-01-26 MED ORDER — FENTANYL CITRATE (PF) 100 MCG/2ML IJ SOLN
INTRAMUSCULAR | Status: DC | PRN
Start: 2022-01-26 — End: 2022-01-26
  Administered 2022-01-26: 50 ug via INTRAVENOUS
  Administered 2022-01-26 (×2): 25 ug via INTRAVENOUS

## 2022-01-26 MED ORDER — MIDAZOLAM HCL 2 MG/2ML IJ SOLN
INTRAMUSCULAR | Status: AC
  Filled 2022-01-26: qty 2

## 2022-01-26 MED ORDER — CEFAZOLIN SODIUM-DEXTROSE 2-4 GM/100ML-% IV SOLN
INTRAVENOUS | Status: AC
  Filled 2022-01-26: qty 100

## 2022-01-26 MED ORDER — PROPOFOL 10 MG/ML IV BOLUS
INTRAVENOUS | Status: DC | PRN
  Administered 2022-01-26: 50 mg via INTRAVENOUS
  Administered 2022-01-26: 150 mg via INTRAVENOUS

## 2022-01-26 MED ORDER — CHLORHEXIDINE GLUCONATE 0.12 % MT SOLN
OROMUCOSAL | Status: AC
  Administered 2022-01-26: 15 mL via OROMUCOSAL
  Filled 2022-01-26: qty 15

## 2022-01-26 MED ORDER — KETOROLAC TROMETHAMINE 30 MG/ML IJ SOLN
INTRAMUSCULAR | Status: AC
  Filled 2022-01-26: qty 1

## 2022-01-26 MED ORDER — IOHEXOL 180 MG/ML  SOLN
INTRAMUSCULAR | Status: DC | PRN
  Administered 2022-01-26: 20 mL

## 2022-01-26 MED ORDER — FENTANYL CITRATE (PF) 100 MCG/2ML IJ SOLN
25.0000 ug | INTRAMUSCULAR | Status: DC | PRN
  Administered 2022-01-26: 25 ug via INTRAVENOUS

## 2022-01-26 MED ORDER — MIDAZOLAM HCL 2 MG/2ML IJ SOLN
INTRAMUSCULAR | Status: DC | PRN
  Administered 2022-01-26: 2 mg via INTRAVENOUS

## 2022-01-26 MED ORDER — ONDANSETRON HCL 4 MG/2ML IJ SOLN: 4.0000 mg | Freq: Once | INTRAMUSCULAR | Status: DC | PRN

## 2022-01-26 MED ORDER — ONDANSETRON HCL 4 MG/2ML IJ SOLN
INTRAMUSCULAR | Status: DC | PRN
  Administered 2022-01-26: 4 mg via INTRAVENOUS

## 2022-01-26 MED ORDER — GEMCITABINE CHEMO FOR BLADDER INSTILLATION 2000 MG
INTRAVENOUS | Status: DC | PRN
  Administered 2022-01-26: 2000 mg via INTRAVESICAL

## 2022-01-26 MED ORDER — HYDROCODONE-ACETAMINOPHEN 5-325 MG PO TABS: 1.0000 | ORAL_TABLET | Freq: Four times a day (QID) | ORAL | 0 refills | Status: DC | PRN

## 2022-01-26 MED ORDER — OXYBUTYNIN CHLORIDE 5 MG PO TABS: 5.0000 mg | ORAL_TABLET | Freq: Three times a day (TID) | ORAL | 0 refills | Status: DC | PRN

## 2022-01-26 SURGICAL SUPPLY — 36 items
BAG DRAIN CYSTO-URO LG1000N (MISCELLANEOUS) ×3 IMPLANT
BAG DRN RND TRDRP ANRFLXCHMBR (UROLOGICAL SUPPLIES) ×2
BAG URINE DRAIN 2000ML AR STRL (UROLOGICAL SUPPLIES) ×3 IMPLANT
BRUSH SCRUB EZ  4% CHG (MISCELLANEOUS)
BRUSH SCRUB EZ 1% IODOPHOR (MISCELLANEOUS) ×3 IMPLANT
BRUSH SCRUB EZ 4% CHG (MISCELLANEOUS) ×2 IMPLANT
CATH FOLEY 2WAY  5CC 16FR (CATHETERS) ×3
CATH FOLEY 2WAY 5CC 16FR (CATHETERS) ×2
CATH URETL OPEN 5X70 (CATHETERS) ×3 IMPLANT
CATH URTH 16FR FL 2W BLN LF (CATHETERS) ×2 IMPLANT
DRAPE UTILITY 15X26 TOWEL STRL (DRAPES) ×3 IMPLANT
DRSG TELFA 4X3 1S NADH ST (GAUZE/BANDAGES/DRESSINGS) ×3 IMPLANT
ELECT LOOP 22F BIPOLAR SML (ELECTROSURGICAL)
ELECT REM PT RETURN 9FT ADLT (ELECTROSURGICAL)
ELECTRODE LOOP 22F BIPOLAR SML (ELECTROSURGICAL) IMPLANT
ELECTRODE REM PT RTRN 9FT ADLT (ELECTROSURGICAL) IMPLANT
GAUZE 4X4 16PLY ~~LOC~~+RFID DBL (SPONGE) ×4 IMPLANT
GLOVE BIO SURGEON STRL SZ 6.5 (GLOVE) ×3 IMPLANT
GOWN STRL REUS W/ TWL LRG LVL3 (GOWN DISPOSABLE) ×4 IMPLANT
GOWN STRL REUS W/TWL LRG LVL3 (GOWN DISPOSABLE) ×6
GUIDEWIRE STR DUAL SENSOR (WIRE) ×2 IMPLANT
IV NS IRRIG 3000ML ARTHROMATIC (IV SOLUTION) ×2 IMPLANT
KIT TURNOVER CYSTO (KITS) ×3 IMPLANT
LOOP CUT BIPOLAR 24F LRG (ELECTROSURGICAL) IMPLANT
NDL SAFETY ECLIPSE 18X1.5 (NEEDLE) ×2 IMPLANT
NEEDLE HYPO 18GX1.5 SHARP (NEEDLE) ×3
PACK CYSTO AR (MISCELLANEOUS) ×3 IMPLANT
PAD ARMBOARD 7.5X6 YLW CONV (MISCELLANEOUS) ×2 IMPLANT
SET CYSTO W/LG BORE CLAMP LF (SET/KITS/TRAYS/PACK) ×3 IMPLANT
SET IRRIG Y TYPE TUR BLADDER L (SET/KITS/TRAYS/PACK) ×2 IMPLANT
SURGILUBE 2OZ TUBE FLIPTOP (MISCELLANEOUS) ×3 IMPLANT
SYR TOOMEY IRRIG 70ML (MISCELLANEOUS) ×3
SYRINGE TOOMEY IRRIG 70ML (MISCELLANEOUS) ×2 IMPLANT
WATER STERILE IRR 1000ML POUR (IV SOLUTION) ×3 IMPLANT
WATER STERILE IRR 3000ML UROMA (IV SOLUTION) ×1 IMPLANT
WATER STERILE IRR 500ML POUR (IV SOLUTION) ×3 IMPLANT

## 2022-01-26 NOTE — Interval H&P Note (Signed)
History and Physical Interval Note:  01/26/2022 9:09 AM  Ian Leblanc  has presented today for surgery, with the diagnosis of Bladder Tumor.  The various methods of treatment have been discussed with the patient and family. After consideration of risks, benefits and other options for treatment, the patient has consented to  Procedure(s): TRANSURETHRAL RESECTION OF BLADDER TUMOR (TURBT) (N/A) BLADDER INSTILLATION OF GEMCITABINE (N/A) CYSTOSCOPY WITH RETROGRADE PYELOGRAM (Bilateral) as a surgical intervention.  The patient's history has been reviewed, patient examined, no change in status, stable for surgery.  I have reviewed the patient's chart and labs.  Questions were answered to the patient's satisfaction.    RRR CTAB  Hollice Espy

## 2022-01-26 NOTE — Transfer of Care (Signed)
Immediate Anesthesia Transfer of Care Note  Patient: Ian Leblanc  Procedure(s) Performed: TRANSURETHRAL RESECTION OF BLADDER TUMOR (TURBT) BLADDER INSTILLATION OF GEMCITABINE CYSTOSCOPY WITH RETROGRADE PYELOGRAM (Bilateral)  Patient Location: PACU  Anesthesia Type:General  Level of Consciousness: awake, alert  and oriented  Airway & Oxygen Therapy: Patient Spontanous Breathing and Patient connected to face mask oxygen  Post-op Assessment: Report given to RN and Post -op Vital signs reviewed and stable  Post vital signs: Reviewed and stable  Last Vitals:  Vitals Value Taken Time  BP 163/90   Temp    Pulse 92   Resp 16   SpO2 97     Last Pain:  Vitals:   01/26/22 0904  TempSrc: Temporal  PainSc: 0-No pain         Complications: No notable events documented.

## 2022-01-26 NOTE — Op Note (Signed)
Date of procedure: 01/26/22  Preoperative diagnosis:  History of bladder cancer Recurrent bladder tumor, bladder neck and trigone (small)  Postoperative diagnosis:  Same as above  Procedure: Cystoscopy TURBT, small Bilateral retrograde pyelogram Instillation of intravesical chemotherapy  Surgeon: Hollice Espy, MD  Anesthesia: General  Complications: None  Intraoperative findings: Small approximately 8 mm bladder tumor on the lateral edge of previous TUR resection on left bladder neck as well as another 3 mm lesion in the left trigone adjacent to but not involving the left UO.  Bilateral retrograde unremarkable.  EBL: Minimal  Specimens: Bladder tumor  Drains: 16 French Foley catheter  Indication: Ian Leblanc is a 61 y.o. patient with small bladder cancer recurrence.  After reviewing the management options for treatment, he elected to proceed with the above surgical procedure(s). We have discussed the potential benefits and risks of the procedure, side effects of the proposed treatment, the likelihood of the patient achieving the goals of the procedure, and any potential problems that might occur during the procedure or recuperation. Informed consent has been obtained.  Description of procedure:  The patient was taken to the operating room and general anesthesia was induced.  The patient was placed in the dorsal lithotomy position, prepped and draped in the usual sterile fashion, and preoperative antibiotics were administered. A preoperative time-out was performed.   A 21 French scope was advanced per urethra into the bladder.  The bladder was carefully inspected.  The lesion in question was identified in the left lateral bladder wall on the peripheral border of a previous resection bed.  Is approximately 8 mm in size.  Incidentally, there is also a very small approximately 3 mm papillary lesion on the left trigone not involving the UO which was not appreciated at the time of  office cystoscopy.  There were no other lesions appreciated.  First, attention was turned to the right UO.  Was cannulated using a 5 [place] open-ended ureteral catheter.  Gentle retrograde pyelogram on the side revealed no hydronephrosis or filling defects.  The same procedure was used on the left side which also showed no hydroureteronephrosis or filling defects.  Both renal units drained promptly.  See PACS images.  Next, cold cup biopsy forceps were used to resect each of the tumors in block.  Bugbee electrocautery and water were then used to fulgurate the base of each of these tumors.  The bladder was carefully inspected.  Hemostasis was excellent.  There were no residual tumors.  The scope was then removed.  A 16 French Foley catheter was placed with 10 cc of water in the balloon.  The patient was then cleaned and dried, repositioned in supine position, reversed of anesthesia, taken to the PACU in stable condition.  2000 mg of intravesical gemcitabine was instilled into the bladder.  This was clamped off and left to dwell for 1 hour.  It was well-tolerated.  At the end of the hour, the chemotherapy was drained and the Foley was removed.  Plan: We will have him follow-up in 3 months for cystoscopy.  I will call him with his pathology results and assess whether or not any further intervention is needed.  Hollice Espy, M.D.

## 2022-01-26 NOTE — Discharge Instructions (Addendum)

## 2022-01-26 NOTE — Anesthesia Procedure Notes (Signed)
Procedure Name: LMA Insertion Date/Time: 01/26/2022 9:41 AM  Performed by: Lily Peer, Jaquil Todt, CRNAPre-anesthesia Checklist: Patient identified, Emergency Drugs available, Suction available, Patient being monitored and Timeout performed Patient Re-evaluated:Patient Re-evaluated prior to induction Oxygen Delivery Method: Circle system utilized Preoxygenation: Pre-oxygenation with 100% oxygen Induction Type: IV induction LMA: LMA inserted LMA Size: 4.0 Number of attempts: 1 Placement Confirmation: positive ETCO2 and breath sounds checked- equal and bilateral Tube secured with: Tape Dental Injury: Teeth and Oropharynx as per pre-operative assessment

## 2022-01-26 NOTE — Anesthesia Preprocedure Evaluation (Signed)
Anesthesia Evaluation  Patient identified by MRN, date of birth, ID band Patient awake    Reviewed: Allergy & Precautions, H&P , NPO status , Patient's Chart, lab work & pertinent test results, reviewed documented beta blocker date and time   Airway Mallampati: III  TM Distance: >3 FB Neck ROM: full    Dental  (+) Teeth Intact   Pulmonary neg pulmonary ROS, former smoker,    Pulmonary exam normal        Cardiovascular Exercise Tolerance: Good hypertension, On Medications negative cardio ROS Normal cardiovascular exam Rate:Normal     Neuro/Psych negative neurological ROS  negative psych ROS   GI/Hepatic negative GI ROS, Neg liver ROS,   Endo/Other  negative endocrine ROSdiabetes, Well Controlled, Type 2, Oral Hypoglycemic Agents  Renal/GU negative Renal ROS  negative genitourinary   Musculoskeletal   Abdominal   Peds  Hematology negative hematology ROS (+)   Anesthesia Other Findings   Reproductive/Obstetrics negative OB ROS                             Anesthesia Physical Anesthesia Plan  ASA: 2  Anesthesia Plan: General LMA   Post-op Pain Management:    Induction:   PONV Risk Score and Plan:   Airway Management Planned:   Additional Equipment:   Intra-op Plan:   Post-operative Plan:   Informed Consent: I have reviewed the patients History and Physical, chart, labs and discussed the procedure including the risks, benefits and alternatives for the proposed anesthesia with the patient or authorized representative who has indicated his/her understanding and acceptance.       Plan Discussed with: CRNA  Anesthesia Plan Comments:         Anesthesia Quick Evaluation

## 2022-01-27 ENCOUNTER — Encounter: Payer: Self-pay | Admitting: Urology

## 2022-01-27 LAB — SURGICAL PATHOLOGY

## 2022-01-31 DIAGNOSIS — E210 Primary hyperparathyroidism: Secondary | ICD-10-CM | POA: Diagnosis not present

## 2022-01-31 DIAGNOSIS — E213 Hyperparathyroidism, unspecified: Secondary | ICD-10-CM | POA: Diagnosis not present

## 2022-02-06 NOTE — Anesthesia Postprocedure Evaluation (Signed)
Anesthesia Post Note  Patient: Ian Leblanc  Procedure(s) Performed: TRANSURETHRAL RESECTION OF BLADDER TUMOR (TURBT) BLADDER INSTILLATION OF GEMCITABINE CYSTOSCOPY WITH RETROGRADE PYELOGRAM (Bilateral)  Patient location during evaluation: PACU Anesthesia Type: General Level of consciousness: awake and alert Pain management: pain level controlled Vital Signs Assessment: post-procedure vital signs reviewed and stable Respiratory status: spontaneous breathing, nonlabored ventilation, respiratory function stable and patient connected to nasal cannula oxygen Cardiovascular status: blood pressure returned to baseline and stable Postop Assessment: no apparent nausea or vomiting Anesthetic complications: no   No notable events documented.   Last Vitals:  Vitals:   01/26/22 1100 01/26/22 1123  BP: (!) 171/87 (!) 162/93  Pulse: 78 60  Resp: 13 20  Temp: (!) 36.3 C (!) 36.2 C  SpO2: 93% 98%    Last Pain:  Vitals:   01/27/22 0833  TempSrc:   PainSc: [place] Alexine Pilant

## 2022-03-22 ENCOUNTER — Other Ambulatory Visit: Payer: Self-pay | Admitting: Family Medicine

## 2022-03-22 DIAGNOSIS — E782 Mixed hyperlipidemia: Secondary | ICD-10-CM

## 2022-04-02 ENCOUNTER — Other Ambulatory Visit: Payer: Self-pay | Admitting: Internal Medicine

## 2022-04-02 DIAGNOSIS — E1165 Type 2 diabetes mellitus with hyperglycemia: Secondary | ICD-10-CM

## 2022-04-02 DIAGNOSIS — I10 Essential (primary) hypertension: Secondary | ICD-10-CM

## 2022-04-03 NOTE — Telephone Encounter (Signed)
Requested Prescriptions  Pending Prescriptions Disp Refills  . benazepril (LOTENSIN) 20 MG tablet [Pharmacy Med Name: Benazepril HCl 20 MG Oral Tablet] 90 tablet 0    Sig: Take 1 tablet by mouth once daily     Cardiovascular:  ACE Inhibitors Failed - 04/02/2022 11:14 AM      Failed - Last BP in normal range    BP Readings from Last 1 Encounters:  01/26/22 (!) 162/93         Passed - Cr in normal range and within 180 days    Creat  Date Value Ref Range Status  10/12/2021 0.66 (L) 0.70 - 1.35 mg/dL Final   Creatinine, Ser  Date Value Ref Range Status  01/25/2022 0.66 0.61 - 1.24 mg/dL Final         Passed - K in normal range and within 180 days    Potassium  Date Value Ref Range Status  01/25/2022 4.0 3.5 - 5.1 mmol/L Final         Passed - Patient is not pregnant      Passed - Valid encounter within last 6 months    Recent Outpatient Visits          5 months ago Controlled type 2 diabetes mellitus with hyperglycemia, without long-term current use of insulin ([place])   [facility] Mecum, Erin E, PA-C   9 months ago Viral upper respiratory tract infection   [facility] Serafina Royals F, FNP   10 months ago Controlled type 2 diabetes mellitus with hyperglycemia, without long-term current use of insulin [facility])   [facility] Delsa Grana, PA-C   1 year ago Controlled type 2 diabetes mellitus with hyperglycemia, without long-term current use of insulin [facility])   [facility] Delsa Grana, PA-C   1 year ago Hematuria, unspecified type   [facility] Delsa Grana, PA-C      Future Appointments            In 1 week Teodora Medici, [facility], [facility]

## 2022-04-10 DIAGNOSIS — E210 Primary hyperparathyroidism: Secondary | ICD-10-CM | POA: Diagnosis not present

## 2022-04-10 DIAGNOSIS — D351 Benign neoplasm of parathyroid gland: Secondary | ICD-10-CM | POA: Diagnosis not present

## 2022-04-11 ENCOUNTER — Ambulatory Visit: Payer: BC Managed Care – PPO | Admitting: Internal Medicine

## 2022-04-11 ENCOUNTER — Encounter: Payer: Self-pay | Admitting: Internal Medicine

## 2022-04-11 VITALS — BP 124/84 | HR 72 | Temp 97.9°F | Resp 16 | Ht 70.0 in | Wt 219.3 lb

## 2022-04-11 DIAGNOSIS — E119 Type 2 diabetes mellitus without complications: Secondary | ICD-10-CM | POA: Diagnosis not present

## 2022-04-11 DIAGNOSIS — Z23 Encounter for immunization: Secondary | ICD-10-CM | POA: Diagnosis not present

## 2022-04-11 DIAGNOSIS — I10 Essential (primary) hypertension: Secondary | ICD-10-CM | POA: Diagnosis not present

## 2022-04-11 DIAGNOSIS — E782 Mixed hyperlipidemia: Secondary | ICD-10-CM | POA: Diagnosis not present

## 2022-04-11 DIAGNOSIS — J302 Other seasonal allergic rhinitis: Secondary | ICD-10-CM | POA: Diagnosis not present

## 2022-04-11 LAB — POCT GLYCOSYLATED HEMOGLOBIN (HGB A1C): Hemoglobin A1C: 5.8 % — AB (ref 4.0–5.6)

## 2022-04-11 MED ORDER — METFORMIN HCL 500 MG PO TABS: 500.0000 mg | ORAL_TABLET | Freq: Every day | ORAL | 0 refills | Status: DC

## 2022-04-11 MED ORDER — BENAZEPRIL HCL 20 MG PO TABS: 20.0000 mg | ORAL_TABLET | Freq: Every day | ORAL | 1 refills | Status: DC

## 2022-04-11 MED ORDER — ATORVASTATIN CALCIUM 80 MG PO TABS: 80.0000 mg | ORAL_TABLET | Freq: Every day | ORAL | 1 refills | Status: DC

## 2022-04-11 MED ORDER — CETIRIZINE HCL 10 MG PO TABS: 10.0000 mg | ORAL_TABLET | Freq: Every day | ORAL | 11 refills | Status: DC

## 2022-04-11 NOTE — Patient Instructions (Signed)
It was great seeing you today!  Plan discussed at today's visit: -Medications refilled -A1c good at 5.8% -Urine test today -Flu vaccine today  Follow up in: 6 months   Take care and let us know if you have any questions or concerns prior to your next visit.  Dr. Rosana Berger

## 2022-04-11 NOTE — Progress Notes (Signed)
Established Patient Office Visit  Subjective   Patient ID: Ian Leblanc, male    DOB: 08-17-1961  Age: 61 y.o. MRN: [id number]  Chief Complaint  Patient presents with   Follow-up    HPI Patient is here for follow up on chronic medical conditions. Recently had a bladder tumor removed in June.  Hypertension: -Medications: Benazepril 20 mg -Patient is compliant with above medications and reports no side effects. -Checking BP at home (average): doesn't check  -Denies any SOB, CP, vision changes, LE edema or symptoms of hypotension -Exercise: Starting to run again   HLD: -Medications: Lipitor 80  mg, aspirin 81 mg -Patient is compliant with above medications and reports no side effects.  -Last lipid panel: Lipid Panel     Component Value Date/Time   CHOL 153 10/12/2021 0944   TRIG 83 10/12/2021 0944   HDL 42 10/12/2021 0944   CHOLHDL 3.6 10/12/2021 0944   VLDL 20 07/05/2016 0843   LDLCALC 94 10/12/2021 0944   The 10-year ASCVD risk score (Arnett DK, et al., 2019) is: 18.5%   Values used to calculate the score:     Age: 35 years     Sex: Male     Is Non-Hispanic African American: No     Diabetic: Yes     Tobacco smoker: No     Systolic Blood Pressure: 875 mmHg     Is BP treated: Yes     HDL Cholesterol: 42 mg/dL     Total Cholesterol: 153 mg/dL  Diabetes, Type 2: -Last A1c 2/23 5.7% -Medications: Metformin 500 mg once daily  -Patient is compliant with the above medications and reports no side effects.  -Checking BG at home: No -Eye exam: UTD April  -Foot exam: Due -Microalbumin: Due  -Statin: yes -PNA vaccine: Prevnar 23 in 2015 -Denies symptoms of hypoglycemia, polyuria, polydipsia, numbness extremities, foot ulcers/trauma.   Seasonal Allergies: -Currently on Zyrtec 10 mg, Flonase   Health Maintenance: -Blood work up to date -Colonoscopy 07/2018 repeat in 10 years    Review of Systems  Constitutional:  Negative for chills and fever.  Eyes:  Negative  for blurred vision.  Respiratory:  Negative for shortness of breath.   Cardiovascular:  Negative for chest pain.  Gastrointestinal:  Negative for abdominal pain, diarrhea, nausea and vomiting.  Neurological:  Negative for tingling and headaches.      Objective:     BP 124/84   Pulse 72   Temp 97.9 F (36.6 C)   Resp 16   Ht '5\' 10"'$  (1.778 m)   Wt 219 lb 4.8 oz (99.5 kg)   SpO2 98%   BMI 31.47 kg/m  BP Readings from Last 3 Encounters:  04/11/22 124/84  01/26/22 (!) 162/93  01/17/22 (!) 163/101   Wt Readings from Last 3 Encounters:  04/11/22 219 lb 4.8 oz (99.5 kg)  01/26/22 227 lb 4.7 oz (103.1 kg)  01/25/22 227 lb 4.7 oz (103.1 kg)      Physical Exam Constitutional:      Appearance: Normal appearance.  HENT:     Head: Normocephalic and atraumatic.     Right Ear: Tympanic membrane, ear canal and external ear normal.     Left Ear: Tympanic membrane, ear canal and external ear normal.  Eyes:     Conjunctiva/sclera: Conjunctivae normal.  Cardiovascular:     Rate and Rhythm: Normal rate and regular rhythm.     Pulses:          Dorsalis pedis pulses  are 2+ on the right side and 2+ on the left side.  Pulmonary:     Effort: Pulmonary effort is normal.     Breath sounds: Normal breath sounds.  Musculoskeletal:     Right lower leg: No edema.     Left lower leg: No edema.     Right foot: Normal range of motion. No deformity, bunion, Charcot foot, foot drop or prominent metatarsal heads.     Left foot: Normal range of motion. No deformity, bunion, Charcot foot, foot drop or prominent metatarsal heads.  Feet:     Right foot:     Protective Sensation: 6 sites tested.  6 sites sensed.     Skin integrity: Skin integrity normal.     Toenail Condition: Right toenails are normal.     Left foot:     Protective Sensation: 6 sites tested.  6 sites sensed.     Skin integrity: Skin integrity normal.     Toenail Condition: Left toenails are normal.  Skin:    General: Skin is  warm and dry.  Neurological:     General: No focal deficit present.     Mental Status: He is alert. Mental status is at baseline.  Psychiatric:        Mood and Affect: Mood normal.        Behavior: Behavior normal.      No results found for any visits on 04/11/22.  Last CBC Lab Results  Component Value Date   WBC 6.2 01/25/2022   HGB 15.0 01/25/2022   HCT 43.6 01/25/2022   MCV 84.7 01/25/2022   MCH 29.1 01/25/2022   RDW 12.4 01/25/2022   PLT 173 76/72/0947   Last metabolic panel Lab Results  Component Value Date   GLUCOSE 96 01/25/2022   NA 137 01/25/2022   K 4.0 01/25/2022   CL 108 01/25/2022   CO2 23 01/25/2022   BUN 9 01/25/2022   CREATININE 0.66 01/25/2022   GFRNONAA >60 01/25/2022   CALCIUM 9.2 01/25/2022   PROT 6.5 10/12/2021   ALBUMIN 4.6 07/05/2016   BILITOT 0.7 10/12/2021   ALKPHOS 68 07/05/2016   AST 25 10/12/2021   ALT 42 10/12/2021   ANIONGAP 6 01/25/2022   Last lipids Lab Results  Component Value Date   CHOL 153 10/12/2021   HDL 42 10/12/2021   LDLCALC 94 10/12/2021   TRIG 83 10/12/2021   CHOLHDL 3.6 10/12/2021   Last hemoglobin A1c Lab Results  Component Value Date   HGBA1C 5.7 (H) 10/12/2021   Last thyroid functions No results found for: "TSH", "T3TOTAL", "T4TOTAL", "THYROIDAB" Last vitamin D No results found for: "25OHVITD2", "25OHVITD3", "VD25OH" Last vitamin B12 and Folate No results found for: "VITAMINB12", "FOLATE"    The 10-year ASCVD risk score (Arnett DK, et al., 2019) is: 18.5%    Assessment & Plan:   1. Hypertension, unspecified type: Chronic and stable.  Blood pressure at goal.  Continue benazepril 20 mg, refilled today.  Follow-up in 6 months for recheck.  - benazepril (LOTENSIN) 20 MG tablet; Take 1 tablet (20 mg total) by mouth daily.  Dispense: 90 tablet; Refill: 1  2. Mixed hyperlipidemia: Chronic and stable.  Continue Lipitor 80 mg, refilled today.  - atorvastatin (LIPITOR) 80 MG tablet; Take 1 tablet (80 mg  total) by mouth at bedtime.  Dispense: 90 tablet; Refill: 1  3. Type 2 diabetes mellitus without complication, without long-term current use of insulin ([place]): A1c in the office today 5.8%.  Patient is  only taking metformin 500 mg once daily, we will continue this dosage and frequency.  Metformin refilled today.  Diabetic foot exam completed today, will obtain urine microalbumin today as well.  - POCT HgB A1C - metFORMIN (GLUCOPHAGE) 500 MG tablet; Take 1 tablet (500 mg total) by mouth daily with breakfast.  Dispense: 180 tablet; Refill: 0 - Urine Microalbumin w/creat. ratio - [facility] Diabetes Foot Exam  4. Seasonal allergies: Stable.  Continue Zyrtec 10 mg, refilled today.  - cetirizine (ZYRTEC) 10 MG tablet; Take 1 tablet (10 mg total) by mouth daily.  Dispense: 30 tablet; Refill: 11  5. Flu vaccine need: Flu vaccine administered today.  - Flu Vaccine QUAD 6+ mos PF IM (Fluarix Quad PF)   Return in about 6 months (around 10/12/2022).    Teodora Medici, DO

## 2022-04-12 LAB — MICROALBUMIN / CREATININE URINE RATIO
Creatinine, Urine: 27 mg/dL (ref 20–320)
Microalb, Ur: <0.2 mg/dL

## 2022-04-26 ENCOUNTER — Encounter: Payer: Self-pay | Admitting: Urology

## 2022-04-26 ENCOUNTER — Ambulatory Visit: Payer: BC Managed Care – PPO | Admitting: Urology

## 2022-04-26 VITALS — BP 175/95 | HR 71 | Ht 70.0 in | Wt 219.0 lb

## 2022-04-26 DIAGNOSIS — Z8551 Personal history of malignant neoplasm of bladder: Secondary | ICD-10-CM

## 2022-04-26 DIAGNOSIS — C672 Malignant neoplasm of lateral wall of bladder: Secondary | ICD-10-CM

## 2022-04-26 LAB — URINALYSIS, COMPLETE
Bilirubin, UA: NEGATIVE
Glucose, UA: NEGATIVE
Ketones, UA: NEGATIVE
Leukocytes,UA: NEGATIVE
Nitrite, UA: NEGATIVE
Protein,UA: NEGATIVE
Specific Gravity, UA: 1.02 (ref 1.005–1.030)
Urobilinogen, Ur: 1 mg/dL (ref 0.2–1.0)
pH, UA: 6.5 (ref 5.0–7.5)

## 2022-04-26 LAB — MICROSCOPIC EXAMINATION

## 2022-04-26 NOTE — Progress Notes (Signed)
   04/26/22  CC: cysto   HPI: Ian Leblanc is a 61 y.o. male with a personal history of kidney stones, gross hematuria with negative workup, bladder cancer, who presents today for 3 month surveillance cystoscopy.   CT urogram on 12/02/2020 showed 1 small area within the bladder, several millimeters in size near the dome which may recheck present filling defect. Otherwise there were no additional upper tract pathology identified. Small incidental pulmonary nodule felt to be likely benign.    He underwent a transurethral resection of bladder tumor with Gemcitabine for malignant neoplasm of lateral wall of urinary bladder on 01/10/2021.    Surgical pathology revealed urinary bladder; transurethral resection: non invasive papillary urothelial carcinoma, low-grade. No muscularis propria identified.   He returned to the operating room in June 2023 with an 8 mm tumor which appeared to be a small satellite recurrence at the edge of the TUR resection bed as well as a 3 mm at the left trigone.  Surgical pathology was consistent with low-grade TA disease.  He did receive postprocedural gentamicin.  Vitals:   04/26/22 0843  BP: (!) 175/95  Pulse: 71    NED. A&Ox3.   No respiratory distress   Abd soft, NT, ND Normal phallus with bilateral descended testicles  Cystoscopy Procedure Note  Patient identification was confirmed, informed consent was obtained, and patient was prepped using Betadine solution.  Lidocaine jelly was administered per urethral meatus.     Pre-Procedure: - Inspection reveals a normal caliber ureteral meatus.  Procedure: The flexible cystoscope was introduced without difficulty - No urethral strictures/lesions are present. - Normal prostate  - Slightly Elevated bladder neck - Bilateral ureteral orifices identified - Bladder mucosa  reveals no ulcers, tumors, or lesions - No bladder stones - No trabeculation - stellate scare on left lateral bladder neck area    Retroflexion shows stellate scar near bladder neck as well as small intravesical component of the prostate   Post-Procedure: - Patient tolerated the procedure well   Assessment/ Plan:  History of bladder cancer  -Intermediate risk given recurrence -No evidence of disease today -Plan for Q 14-monthcystoscopy for 2 years then decrease in frequency thereafter  Follow-up cystoscopy in 4 months    AHollice Espy MD

## 2022-08-29 ENCOUNTER — Ambulatory Visit: Payer: BC Managed Care – PPO | Admitting: Urology

## 2022-08-29 DIAGNOSIS — Z8551 Personal history of malignant neoplasm of bladder: Secondary | ICD-10-CM | POA: Diagnosis not present

## 2022-08-29 DIAGNOSIS — N3289 Other specified disorders of bladder: Secondary | ICD-10-CM | POA: Diagnosis not present

## 2022-08-29 DIAGNOSIS — D494 Neoplasm of unspecified behavior of bladder: Secondary | ICD-10-CM

## 2022-08-29 LAB — URINALYSIS, COMPLETE
Bilirubin, UA: NEGATIVE
Glucose, UA: NEGATIVE
Leukocytes,UA: NEGATIVE
Nitrite, UA: NEGATIVE
Protein,UA: NEGATIVE
RBC, UA: NEGATIVE
Specific Gravity, UA: 1.015 (ref 1.005–1.030)
Urobilinogen, Ur: 1 mg/dL (ref 0.2–1.0)
pH, UA: 7 (ref 5.0–7.5)

## 2022-08-29 LAB — MICROSCOPIC EXAMINATION: Bacteria, UA: NONE SEEN

## 2022-08-29 NOTE — Progress Notes (Signed)
   08/29/22  CC: cysto   HPI: Ian Leblanc is a 62 y.o. male with a personal history of kidney stones, gross hematuria with negative workup, bladder cancer, who presents today for 3 month surveillance cystoscopy.   CT urogram on 12/02/2020 showed 1 small area within the bladder, several millimeters in size near the dome which may recheck present filling defect. Otherwise there were no additional upper tract pathology identified. Small incidental pulmonary nodule felt to be likely benign.    He underwent a transurethral resection of bladder tumor with Gemcitabine for malignant neoplasm of lateral wall of urinary bladder on 01/10/2021.    Surgical pathology revealed urinary bladder; transurethral resection: non invasive papillary urothelial carcinoma, low-grade. No muscularis propria identified.   He returned to the operating room in June 2023 with an 8 mm tumor which appeared to be a small satellite recurrence at the edge of the TUR resection bed as well as a 3 mm at the left trigone.  Surgical pathology was consistent with low-grade TA disease.  He did receive postprocedural gentamicin.   NED. A&Ox3.   No respiratory distress   Abd soft, NT, ND Normal phallus with bilateral descended testicles  Cystoscopy Procedure Note  Patient identification was confirmed, informed consent was obtained, and patient was prepped using Betadine solution.  Lidocaine jelly was administered per urethral meatus.     Pre-Procedure: - Inspection reveals a normal caliber ureteral meatus.  Procedure: The flexible cystoscope was introduced without difficulty - No urethral strictures/lesions are present. - Normal prostate  - Slightly Elevated bladder neck - Bilateral ureteral orifices identified - Bladder mucosa reveals a stellate scar at the left lateral bladder wall, at the 5 o'clock position of the scar, there is a very subtle raised area question early recurrence versus inflammatory - No bladder  stones - No trabeculation - stellate scare on left lateral bladder neck area   Retroflexion shows stellate scar near bladder neck as well as small intravesical component of the prostate   Post-Procedure: - Patient tolerated the procedure well   Assessment/ Plan:  History of bladder cancer  -Intermediate risk given recurrence -No obvious recurrence today although there is a small subtle texture area at the periphery of his previous scar, we will follow this cystoscopically with low threshold to intervene with growth or changes -Plan for Q 55-monthcystoscopy for 2 years then decrease in frequency thereafter  Follow-up cystoscopy in 3 months    AHollice Espy MD

## 2022-10-05 NOTE — Progress Notes (Signed)
Established Patient Office Visit  Subjective   Patient ID: Ian Leblanc, male    DOB: [birth year]  Age: 62 y.o. MRN: [id number]  Chief Complaint  Patient presents with   Follow-up    HPI Patient is here for follow up on chronic medical conditions. Patient had the new COVID vaccine the beginning of January and feels like he has had sinus pressure/congestion, rhinorrhea and dry cough since then. Denies fevers. He is not taking his anti-histamine but is using Flonase.  Hypertension: -Medications: Benazepril 20 mg -Patient is compliant with above medications and reports no side effects. -Checking BP at home (average): doesn't check  -Denies any SOB, CP, vision changes, LE edema or symptoms of hypotension  HLD: -Medications: Lipitor 80  mg -Patient is compliant with above medications and reports no side effects.  -Last lipid panel: Lipid Panel     Component Value Date/Time   CHOL 153 10/12/2021 0944   TRIG 83 10/12/2021 0944   HDL 42 10/12/2021 0944   CHOLHDL 3.6 10/12/2021 0944   VLDL 20 07/05/2016 0843   LDLCALC 94 10/12/2021 0944   The 10-year ASCVD risk score (Arnett DK, et al., 2019) is: 19.5%   Values used to calculate the score:     Age: 59 years     Sex: Male     Is Non-Hispanic African American: No     Diabetic: Yes     Tobacco smoker: No     Systolic Blood Pressure: 123XX123 mmHg     Is BP treated: Yes     HDL Cholesterol: 42 mg/dL     Total Cholesterol: 153 mg/dL  Diabetes, Type 2: -Last A1c 8/23 5.8% -Medications: Metformin 500 mg once daily  -Patient is compliant with the above medications and reports no side effects.  -Checking BG at home: No -Eye exam: UTD April  -Foot exam: UTD 8/23 -Microalbumin: UTD 8/23 -Statin: yes -PNA vaccine: Prevnar 23 in 2015, due -Denies symptoms of hypoglycemia, polyuria, polydipsia, numbness extremities, foot ulcers/trauma.   Seasonal Allergies: -Currently Flonase, not taking Zyrtec   Health Maintenance: -Blood work  due -Colonoscopy 07/2018 repeat in 10 years    Review of Systems  Constitutional:  Negative for chills and fever.  HENT:  Positive for congestion. Negative for sinus pain and sore throat.   Eyes:  Negative for blurred vision.  Respiratory:  Positive for cough. Negative for sputum production and shortness of breath.   Cardiovascular:  Negative for chest pain.  Gastrointestinal:  Negative for abdominal pain, diarrhea, nausea and vomiting.  Neurological:  Negative for headaches.      Objective:     BP 122/84   Pulse 93   Temp 98.3 F (36.8 C)   Resp 18   Ht 5' 10"$  (1.778 m)   Wt 224 lb 6.4 oz (101.8 kg)   SpO2 93%   BMI 32.20 kg/m  BP Readings from Last 3 Encounters:  10/06/22 122/84  04/26/22 (!) 175/95  04/11/22 124/84   Wt Readings from Last 3 Encounters:  10/06/22 224 lb 6.4 oz (101.8 kg)  04/26/22 219 lb (99.3 kg)  04/11/22 219 lb 4.8 oz (99.5 kg)      Physical Exam Constitutional:      Appearance: Normal appearance.  HENT:     Head: Normocephalic and atraumatic.     Right Ear: Tympanic membrane, ear canal and external ear normal.     Left Ear: Tympanic membrane, ear canal and external ear normal.     Nose:  Comments: Inflammation in the right inferior turbinate     Mouth/Throat:     Mouth: Mucous membranes are moist.     Comments: PND present  Eyes:     Conjunctiva/sclera: Conjunctivae normal.  Cardiovascular:     Rate and Rhythm: Normal rate and regular rhythm.  Pulmonary:     Effort: Pulmonary effort is normal.     Breath sounds: Normal breath sounds.  Skin:    General: Skin is warm and dry.  Neurological:     General: No focal deficit present.     Mental Status: He is alert. Mental status is at baseline.  Psychiatric:        Mood and Affect: Mood normal.        Behavior: Behavior normal.      No results found for any visits on 10/06/22.  Last CBC Lab Results  Component Value Date   WBC 6.2 01/25/2022   HGB 15.0 01/25/2022   HCT  43.6 01/25/2022   MCV 84.7 01/25/2022   MCH 29.1 01/25/2022   RDW 12.4 01/25/2022   PLT 173 [id number]   Last metabolic panel Lab Results  Component Value Date   GLUCOSE 96 01/25/2022   NA 137 01/25/2022   K 4.0 01/25/2022   CL 108 01/25/2022   CO2 23 01/25/2022   BUN 9 01/25/2022   CREATININE 0.66 01/25/2022   GFRNONAA >60 01/25/2022   CALCIUM 9.2 01/25/2022   PROT 6.5 10/12/2021   ALBUMIN 4.6 07/05/2016   BILITOT 0.7 10/12/2021   ALKPHOS 68 07/05/2016   AST 25 10/12/2021   ALT 42 10/12/2021   ANIONGAP 6 01/25/2022   Last lipids Lab Results  Component Value Date   CHOL 153 10/12/2021   HDL 42 10/12/2021   LDLCALC 94 10/12/2021   TRIG 83 10/12/2021   CHOLHDL 3.6 10/12/2021   Last hemoglobin A1c Lab Results  Component Value Date   HGBA1C 5.8 (A) 04/11/2022   Last thyroid functions No results found for: "TSH", "T3TOTAL", "T4TOTAL", "THYROIDAB" Last vitamin D No results found for: "25OHVITD2", "25OHVITD3", "VD25OH" Last vitamin B12 and Folate No results found for: "VITAMINB12", "FOLATE"    The 10-year ASCVD risk score (Arnett DK, et al., 2019) is: 19.5%    Assessment & Plan:   1. Hypertension, unspecified type: Chronic and stable.  Continue benazepril 20 mg daily, refilled today.  Due for annual labs including CBC and CMP.  - CBC w/Diff/Platelet - COMPLETE METABOLIC PANEL WITH GFR - benazepril (LOTENSIN) 20 MG tablet; Take 1 tablet (20 mg total) by mouth daily.  Dispense: 90 tablet; Refill: 1  2. Mixed hyperlipidemia: Recheck fasting labs today.  Continue Lipitor 80 mg daily, refilled. - Lipid Profile - atorvastatin (LIPITOR) 80 MG tablet; Take 1 tablet (80 mg total) by mouth at bedtime.  Dispense: 90 tablet; Refill: 1  3. Type 2 diabetes mellitus without complication, without long-term current use of insulin ([place]): Controlled.  Recheck A1c today.  Continue metformin 500 mg daily.  - HgB A1c - metFORMIN (GLUCOPHAGE) 500 MG tablet; Take 1 tablet (500 mg  total) by mouth daily with breakfast.  Dispense: 180 tablet; Refill: 0  4. Prostate cancer screening: Check PSA today.  - PSA  5. Vaccine for streptococcus pneumoniae and influenza: Prevnar 20 administered.  - Pneumococcal conjugate vaccine 20-valent (Prevnar 20)  6. Sinus pressure: Continue using nasal steroid, recommend he restart his Zyrtec.  Will treat with Medrol Dosepak as symptoms have been going on for several weeks.  - methylPREDNISolone (MEDROL  DOSEPAK) 4 MG TBPK tablet; Day 1: Take 8 mg (2 tablets) before breakfast, 4 mg (1 tablet) after lunch, 4 mg (1 tablet) after supper, and 8 mg (2 tablets) at bedtime. Day 2:Take 4 mg (1 tablet) before breakfast, 4 mg (1 tablet) after lunch, 4 mg (1 tablet) after supper, and 8 mg (2 tablets) at bedtime. Day 3: Take 4 mg (1 tablet) before breakfast, 4 mg (1 tablet) after lunch, 4 mg (1 tablet) after supper, and 4 mg (1 tablet) at bedtime. Day 4: Take 4 mg (1 tablet) before breakfast, 4 mg (1 tablet) after lunch, and 4 mg (1 tablet) at bedtime. Day 5: Take 4 mg (1 tablet) before breakfast and 4 mg (1 tablet) at bedtime. Day 6: Take 4 mg (1 tablet) before breakfast.  Dispense: 1 each; Refill: 0   Return in about 6 months (around 04/06/2023).    Teodora Medici, DO

## 2022-10-06 ENCOUNTER — Encounter: Payer: Self-pay | Admitting: Internal Medicine

## 2022-10-06 ENCOUNTER — Ambulatory Visit: Payer: BC Managed Care – PPO | Admitting: Internal Medicine

## 2022-10-06 ENCOUNTER — Ambulatory Visit: Payer: Self-pay | Admitting: *Deleted

## 2022-10-06 VITALS — BP 122/84 | HR 93 | Temp 98.3°F | Resp 18 | Ht 70.0 in | Wt 224.4 lb

## 2022-10-06 DIAGNOSIS — Z23 Encounter for immunization: Secondary | ICD-10-CM

## 2022-10-06 DIAGNOSIS — I10 Essential (primary) hypertension: Secondary | ICD-10-CM | POA: Diagnosis not present

## 2022-10-06 DIAGNOSIS — E119 Type 2 diabetes mellitus without complications: Secondary | ICD-10-CM

## 2022-10-06 DIAGNOSIS — E782 Mixed hyperlipidemia: Secondary | ICD-10-CM | POA: Diagnosis not present

## 2022-10-06 DIAGNOSIS — J3489 Other specified disorders of nose and nasal sinuses: Secondary | ICD-10-CM

## 2022-10-06 DIAGNOSIS — Z125 Encounter for screening for malignant neoplasm of prostate: Secondary | ICD-10-CM

## 2022-10-06 MED ORDER — BENAZEPRIL HCL 20 MG PO TABS: 20.0000 mg | ORAL_TABLET | Freq: Every day | ORAL | 1 refills | Status: DC

## 2022-10-06 MED ORDER — ATORVASTATIN CALCIUM 80 MG PO TABS: 80.0000 mg | ORAL_TABLET | Freq: Every day | ORAL | 1 refills | Status: DC

## 2022-10-06 MED ORDER — METFORMIN HCL 500 MG PO TABS: 500.0000 mg | ORAL_TABLET | Freq: Every day | ORAL | 0 refills | Status: DC

## 2022-10-06 MED ORDER — METHYLPREDNISOLONE 4 MG PO TBPK: ORAL_TABLET | ORAL | 0 refills | Status: DC

## 2022-10-06 NOTE — Patient Instructions (Addendum)
It was great seeing you today!  Plan discussed at today's visit: -Blood work ordered today, results will be uploaded to [facility].  -Medications refilled  -Continue Flonase, restart Zyrtec, can take Corcidin as needed for congestion. Also take steroid pack for symptoms  -Pneumonia vaccine today   Follow up in: 6 months   Take care and let us know if you have any questions or concerns prior to your next visit.  Dr. Rosana Berger

## 2022-10-06 NOTE — Telephone Encounter (Signed)
  Chief Complaint: Christine needing clarification on the prednisone Rx.  Medrol Dosepack Symptoms: Dr. Rosana Berger ordered it.   It's written for quantity 1.    Is it for 21 tablets? Frequency: now Pertinent Negatives: Patient denies N/A Disposition: []$ ED /[]$ Urgent Care (no appt availability in office) / []$ Appointment(In office/virtual)/ []$  [facility]/ []$ Home Care/ []$ Refused Recommended Disposition /[]$ [facility]/ [x]$  Follow-up with PCP Additional Notes: Please call [facility] [phone] and let them know.

## 2022-10-06 NOTE — Telephone Encounter (Signed)
Reason for Disposition  [1] Pharmacy calling with prescription question AND [2] triager unable to answer question  Answer Assessment - Initial Assessment Questions 1. NAME of MEDICINE: "What medicine(s) are you calling about?"     Prednisone   [facility] 2. QUESTION: "What is your question?" (e.g., double dose of medicine, side effect)     Medrol dose pack.     Quantity 1.    Is it for 21 tablets? 3. PRESCRIBER: "Who prescribed the medicine?" Reason: if prescribed by specialist, call should be referred to that group.     Dr. Rosana Berger  4. SYMPTOMS: "Do you have any symptoms?" If Yes, ask: "What symptoms are you having?"  "How bad are the symptoms (e.g., mild, moderate, severe)     N/A 5. PREGNANCY:  "Is there any chance that you are pregnant?" "When was your last menstrual period?"     N/A  Protocols used: Medication Question Call-A-AH

## 2022-10-07 LAB — CBC WITH DIFFERENTIAL/PLATELET
Absolute Monocytes: 531 cells/uL (ref 200–950)
Basophils Absolute: 58 cells/uL (ref 0–200)
Basophils Relative: 0.9 %
Eosinophils Absolute: 109 cells/uL (ref 15–500)
Eosinophils Relative: 1.7 %
HCT: 43.8 % (ref 38.5–50.0)
Hemoglobin: 15.4 g/dL (ref 13.2–17.1)
Lymphs Abs: 1792 cells/uL (ref 850–3900)
MCH: 30.3 pg (ref 27.0–33.0)
MCHC: 35.2 g/dL (ref 32.0–36.0)
MCV: 86.1 fL (ref 80.0–100.0)
MPV: 11.7 fL (ref 7.5–12.5)
Monocytes Relative: 8.3 %
Neutro Abs: 3910 cells/uL (ref 1500–7800)
Neutrophils Relative %: 61.1 %
Platelets: 177 10*3/uL (ref 140–400)
RBC: 5.09 10*6/uL (ref 4.20–5.80)
RDW: 12.2 % (ref 11.0–15.0)
Total Lymphocyte: 28 %
WBC: 6.4 10*3/uL (ref 3.8–10.8)

## 2022-10-07 LAB — COMPLETE METABOLIC PANEL WITH GFR
AG Ratio: 1.8 (calc) (ref 1.0–2.5)
ALT: 45 U/L (ref 9–46)
AST: 21 U/L (ref 10–35)
Albumin: 4.6 g/dL (ref 3.6–5.1)
Alkaline phosphatase (APISO): 75 U/L (ref 35–144)
BUN: 11 mg/dL (ref 7–25)
CO2: 25 mmol/L (ref 20–32)
Calcium: 9.6 mg/dL (ref 8.6–10.3)
Chloride: 105 mmol/L (ref 98–110)
Creat: 0.71 mg/dL (ref 0.70–1.35)
Globulin: 2.5 g/dL (calc) (ref 1.9–3.7)
Glucose, Bld: 117 mg/dL — ABNORMAL HIGH (ref 65–99)
Potassium: 4.4 mmol/L (ref 3.5–5.3)
Sodium: 140 mmol/L (ref 135–146)
Total Bilirubin: 0.7 mg/dL (ref 0.2–1.2)
Total Protein: 7.1 g/dL (ref 6.1–8.1)
eGFR: 103 mL/min/{1.73_m2} (ref >=60)

## 2022-10-07 LAB — LIPID PANEL
Cholesterol: 145 mg/dL (ref <200)
HDL: 43 mg/dL (ref >=40)
LDL Cholesterol (Calc): 78 mg/dL (calc)
Non-HDL Cholesterol (Calc): 102 mg/dL (calc) (ref <130)
Total CHOL/HDL Ratio: 3.4 (calc) (ref <5.0)
Triglycerides: 137 mg/dL (ref <150)

## 2022-10-07 LAB — HEMOGLOBIN A1C
Hgb A1c MFr Bld: 6.8 % of total Hgb — ABNORMAL HIGH (ref <5.7)
Mean Plasma Glucose: 148 mg/dL
eAG (mmol/L): 8.2 mmol/L

## 2022-10-07 LAB — PSA: PSA: 1.25 ng/mL (ref <=4.00)

## 2022-11-28 ENCOUNTER — Other Ambulatory Visit: Payer: BC Managed Care – PPO | Admitting: Urology

## 2022-12-05 ENCOUNTER — Telehealth: Payer: Self-pay

## 2022-12-05 ENCOUNTER — Ambulatory Visit: Payer: BC Managed Care – PPO | Admitting: Urology

## 2022-12-05 ENCOUNTER — Other Ambulatory Visit: Payer: Self-pay | Admitting: Urology

## 2022-12-05 ENCOUNTER — Encounter: Payer: Self-pay | Admitting: Urology

## 2022-12-05 VITALS — BP 153/113 | HR 70 | Ht 69.0 in | Wt 220.0 lb

## 2022-12-05 DIAGNOSIS — D494 Neoplasm of unspecified behavior of bladder: Secondary | ICD-10-CM

## 2022-12-05 DIAGNOSIS — Z8551 Personal history of malignant neoplasm of bladder: Secondary | ICD-10-CM | POA: Diagnosis not present

## 2022-12-05 LAB — URINALYSIS, COMPLETE
Bilirubin, UA: NEGATIVE
Glucose, UA: NEGATIVE
Ketones, UA: NEGATIVE
Leukocytes,UA: NEGATIVE
Nitrite, UA: NEGATIVE
Protein,UA: NEGATIVE
Specific Gravity, UA: 1.01 (ref 1.005–1.030)
Urobilinogen, Ur: 0.2 mg/dL (ref 0.2–1.0)
pH, UA: 6 (ref 5.0–7.5)

## 2022-12-05 LAB — MICROSCOPIC EXAMINATION

## 2022-12-05 NOTE — H&P (View-Only) (Signed)
   12/05/22  CC: cysto   HPI: Ian Leblanc is a 61 y.o. male with a personal history of kidney stones, gross hematuria with negative workup, bladder cancer, who presents today for 3 month surveillance cystoscopy.   CT urogram on 12/02/2020 showed 1 small area within the bladder, several millimeters in size near the dome which may recheck present filling defect. Otherwise there were no additional upper tract pathology identified. Small incidental pulmonary nodule felt to be likely benign.    He underwent a transurethral resection of bladder tumor with Gemcitabine for malignant neoplasm of lateral wall of urinary bladder on 01/10/2021.    Surgical pathology revealed urinary bladder; transurethral resection: non invasive papillary urothelial carcinoma, low-grade. No muscularis propria identified.   He returned to the operating room in June 2023 with an 8 mm tumor which appeared to be a small satellite recurrence at the edge of the TUR resection bed as well as a 3 mm at the left trigone.  Surgical pathology was consistent with low-grade TA disease.  He did receive postprocedural gentamicin.   NED. A&Ox3.   No respiratory distress   Abd soft, NT, ND Normal phallus with bilateral descended testicles  Cystoscopy Procedure Note  Patient identification was confirmed, informed consent was obtained, and patient was prepped using Betadine solution.  Lidocaine jelly was administered per urethral meatus.     Pre-Procedure: - Inspection reveals a normal caliber ureteral meatus.  Procedure: The flexible cystoscope was introduced without difficulty - No urethral strictures/lesions are present. - Normal prostate  - Slightly Elevated bladder neck - Bilateral ureteral orifices identified - Bladder mucosa reveals a stellate scar at the left lateral bladder wall, at the 5 o'clock position of the scar, there is a very subtle raised area question early recurrence versus inflammatory -In addition to the  above, there is a new pedunculated papillary type lesion at the junction of the prostate and bladder neck at the 3 o'clock position concerning for another recurrence - No bladder stones - No trabeculation - stellate scare on left lateral bladder neck area   Retroflexion shows stellate scar near bladder neck as well as small intravesical component of the prostate   Post-Procedure: - Patient tolerated the procedure well   Assessment/ Plan:  History of bladder cancer  -Intermediate risk given recurrence -Today not only is there is some subtle changes at the left lateral bladder wall but there is also a pedunculated lesion at the bladder neck/prostatic fossa concerning for another recurrence -Recommend returning to the OR for small TURBT, bilateral retrograde instillation of intravesical gemcitabine -Given the frequency for his recurrences of low-grade malignancy, consider intravesical gemcitabine induction course x 6 weeks about a month after his next TURBT.  We discussed risk and benefits along with side effects.  He would like to proceed with this.  Will plan for referral to the cancer center to help facilitate this.   Damante Spragg, MD 

## 2022-12-05 NOTE — Progress Notes (Signed)
   12/05/22  CC: cysto   HPI: Ian Leblanc is a 62 y.o. male with a personal history of kidney stones, gross hematuria with negative workup, bladder cancer, who presents today for 3 month surveillance cystoscopy.   CT urogram on 12/02/2020 showed 1 small area within the bladder, several millimeters in size near the dome which may recheck present filling defect. Otherwise there were no additional upper tract pathology identified. Small incidental pulmonary nodule felt to be likely benign.    He underwent a transurethral resection of bladder tumor with Gemcitabine for malignant neoplasm of lateral wall of urinary bladder on 01/10/2021.    Surgical pathology revealed urinary bladder; transurethral resection: non invasive papillary urothelial carcinoma, low-grade. No muscularis propria identified.   He returned to the operating room in June 2023 with an 8 mm tumor which appeared to be a small satellite recurrence at the edge of the TUR resection bed as well as a 3 mm at the left trigone.  Surgical pathology was consistent with low-grade TA disease.  He did receive postprocedural gentamicin.   NED. A&Ox3.   No respiratory distress   Abd soft, NT, ND Normal phallus with bilateral descended testicles  Cystoscopy Procedure Note  Patient identification was confirmed, informed consent was obtained, and patient was prepped using Betadine solution.  Lidocaine jelly was administered per urethral meatus.     Pre-Procedure: - Inspection reveals a normal caliber ureteral meatus.  Procedure: The flexible cystoscope was introduced without difficulty - No urethral strictures/lesions are present. - Normal prostate  - Slightly Elevated bladder neck - Bilateral ureteral orifices identified - Bladder mucosa reveals a stellate scar at the left lateral bladder wall, at the 5 o'clock position of the scar, there is a very subtle raised area question early recurrence versus inflammatory -In addition to the  above, there is a new pedunculated papillary type lesion at the junction of the prostate and bladder neck at the 3 o'clock position concerning for another recurrence - No bladder stones - No trabeculation - stellate scare on left lateral bladder neck area   Retroflexion shows stellate scar near bladder neck as well as small intravesical component of the prostate   Post-Procedure: - Patient tolerated the procedure well   Assessment/ Plan:  History of bladder cancer  -Intermediate risk given recurrence -Today not only is there is some subtle changes at the left lateral bladder wall but there is also a pedunculated lesion at the bladder neck/prostatic fossa concerning for another recurrence -Recommend returning to the OR for small TURBT, bilateral retrograde instillation of intravesical gemcitabine -Given the frequency for his recurrences of low-grade malignancy, consider intravesical gemcitabine induction course x 6 weeks about a month after his next TURBT.  We discussed risk and benefits along with side effects.  He would like to proceed with this.  Will plan for referral to the cancer center to help facilitate this.   Vanna Scotland, MD

## 2022-12-05 NOTE — Progress Notes (Signed)
   [facility] Surgical Posting Form  Surgery Date: Date: 12/25/2022  Surgeon: Dr. Vanna Scotland, MD  Inpt ( No  )   Outpt (Yes)   Obs ( No  )   Diagnosis: D49.4 Bladder Tumor   -CPT: 16109, 51720, [phone]  Surgery: Transurethral Resection of Bladder Tumor with Intravesical Instillation of Gemcitabine and Bilateral Retrograde Pyelograms   Stop Anticoagulations: Yes and also hold ASA  Cardiac/Medical/Pulmonary Clearance needed: no  *Orders entered into EPIC  Date: 12/05/2022    *Case booked in [place]  Date: 12/05/2022  *Notified pt of Surgery: Date: 12/05/2022  PRE-OP UA & CX: no  *Placed into Prior Authorization Work [place] Date: 12/05/22  Assistant/laser/rep:No

## 2022-12-05 NOTE — Progress Notes (Signed)
Surgical Physician Order Form [facility]  * Scheduling expectation : Next Available  *Length of Case:   *Clearance needed: no  *Anticoagulation Instructions: Hold all anticoagulants  *Aspirin Instructions: Hold Aspirin  *Post-op visit Date/Instructions:   Referred to cancer center for intravesical chemo x 6, needs cysto 3 mo after this is complete  *Diagnosis: Bladder Tumor  *Procedure: bilateral RTG, TURBT <2cm (47829), instillation of intravesical chemotherapy   Additional orders: Gemcitabine  bladder instillation  -Admit type: OUTpatient  -Anesthesia: General  -VTE Prophylaxis Standing Order SCD's       Other:   -Standing Lab Orders Per Anesthesia    Lab other: None  -Standing Test orders EKG/Chest x-ray per Anesthesia       Test other:   - Medications:  Ancef 2gm IV  -Other orders:  N/A

## 2022-12-05 NOTE — Telephone Encounter (Signed)
I spoke with Mr. Woolsey. We have discussed possible surgery dates and Monday May 6th, 2024 was agreed upon by all parties. Patient given information about surgery date, what to expect pre-operatively and post operatively.  We discussed that a Pre-Admission Testing office will be calling to set up the pre-op visit that will take place prior to surgery, and that these appointments are typically done over the phone with a Pre-Admissions RN. Informed patient that our office will communicate any additional care to be provided after surgery. Patients questions or concerns were discussed during our call. Advised to call our office should there be any additional information, questions or concerns that arise. Patient verbalized understanding.

## 2022-12-05 NOTE — Telephone Encounter (Signed)
Left message for patient to return my call to set up surgery. Will try again.  

## 2022-12-06 ENCOUNTER — Telehealth: Payer: Self-pay | Admitting: *Deleted

## 2022-12-06 NOTE — Telephone Encounter (Signed)
Nurse placed call to patient to review appointment details for upcoming new oncology consultation visit. Appointment time and details confirmed with patient. Nurse confirmed that patient is aware of location of clinic. All questions answered.

## 2022-12-07 ENCOUNTER — Inpatient Hospital Stay: Payer: BC Managed Care – PPO | Attending: Oncology | Admitting: Oncology

## 2022-12-07 ENCOUNTER — Inpatient Hospital Stay: Payer: BC Managed Care – PPO

## 2022-12-07 ENCOUNTER — Encounter: Payer: Self-pay | Admitting: Oncology

## 2022-12-07 VITALS — BP 135/85 | HR 71 | Temp 97.5°F | Resp 16 | Ht 69.0 in | Wt 221.0 lb

## 2022-12-07 DIAGNOSIS — Z87891 Personal history of nicotine dependence: Secondary | ICD-10-CM | POA: Diagnosis not present

## 2022-12-07 DIAGNOSIS — C679 Malignant neoplasm of bladder, unspecified: Secondary | ICD-10-CM | POA: Insufficient documentation

## 2022-12-07 NOTE — Progress Notes (Signed)
Surgery scheduled for 5/6

## 2022-12-07 NOTE — Progress Notes (Signed)
[facility]  Telephone:(336) [phone] Fax:(336) [phone]  ID: Ian Leblanc OB: April 21, 1961  MR#: [id number]  AOZ#:[id number]  Patient Care Team: Margarita Mail, DO as PCP - General (Internal Medicine)  CHIEF COMPLAINT: Noninvasive bladder cancer.  INTERVAL HISTORY: Patient is a 62 year old male who has undergone multiple procedures for a noninvasive bladder cancer and is referred for further evaluation and consideration of intravesical gemcitabine.  He currently feels well and is asymptomatic.  He has no neurologic complaints.  He denies any recent fevers or illnesses.  He has a good appetite and denies weight loss.  He has no chest pain, shortness of breath, cough, or hemoptysis.  He denies any nausea, vomiting, constipation, or diarrhea.  He has no urinary complaints.  Patient offers no further specific complaints today.  REVIEW OF SYSTEMS:   Review of Systems  Constitutional: Negative.  Negative for fever, malaise/fatigue and weight loss.  Respiratory: Negative.  Negative for cough, hemoptysis and shortness of breath.   Cardiovascular: Negative.  Negative for chest pain and leg swelling.  Gastrointestinal: Negative.  Negative for abdominal pain.  Genitourinary: Negative.  Negative for dysuria and hematuria.  Musculoskeletal: Negative.  Negative for back pain.  Skin: Negative.  Negative for rash.  Neurological: Negative.  Negative for dizziness, focal weakness, weakness and headaches.  Psychiatric/Behavioral: Negative.  The patient is not nervous/anxious.     As per HPI. Otherwise, a complete review of systems is negative.  PAST MEDICAL HISTORY: Past Medical History:  Diagnosis Date   Abnormal EKG 07/16/2013   Arthritis    Cancer    bladder   Cervical spine disease 02/04/2015   Chest pain 07/16/2013   Diabetes mellitus without complication    History of kidney stones    Hyperglycemia    Hyperlipidemia    Hypertension    Neck pain on left side  02/04/2015   Numbness of fingers 02/04/2015   Paresthesia of left arm 02/04/2015   Screening for prostate cancer 12/09/2014   Sessile colonic polyp     PAST SURGICAL HISTORY: Past Surgical History:  Procedure Laterality Date   BACK SURGERY  2016   cyst removal. no metal in his back   BLADDER INSTILLATION N/A 01/26/2022   Procedure: BLADDER INSTILLATION OF GEMCITABINE;  Surgeon: Vanna Scotland, MD;  Location: [facility] ORS;  Service: Urology;  Laterality: N/A;   CYSTOSCOPY W/ RETROGRADES Bilateral 01/26/2022   Procedure: CYSTOSCOPY WITH RETROGRADE PYELOGRAM;  Surgeon: Vanna Scotland, MD;  Location: [facility] OR[facility];  Service: Urology;  Laterality: Bilateral;   TRANSURETHRAL RESECTION OF BLADDER TUMOR N/A 01/10/2021   Procedure: TRANSURETHRAL RESECTION OF BLADDER TUMOR (TURBT) WITH GEMCITABINE;  Surgeon: Vanna Scotland, MD;  Location: [facility] OR[facility];  Service: Urology;  Laterality: N/A;   TRANSURETHRAL RESECTION OF BLADDER TUMOR N/A 01/26/2022   Procedure: TRANSURETHRAL RESECTION OF BLADDER TUMOR (TURBT);  Surgeon: Vanna Scotland, MD;  Location: [facility] OR[facility];  Service: Urology;  Laterality: N/A;    FAMILY HISTORY: Family History  Problem Relation Age of Onset   Prostate cancer Paternal Grandfather     ADVANCED DIRECTIVES (Y/N):  N  HEALTH MAINTENANCE: Social History   Tobacco Use   Smoking status: Former    Packs/day: .5    Types: Cigarettes    Quit date: 07/17/2003    Years since quitting: 19.4   Smokeless tobacco: Never  Vaping Use   Vaping Use: Never used  Substance Use Topics   Alcohol use: No    Comment: beer every now and then.   Drug use: No  Colonoscopy:  PAP:  Bone density:  Lipid panel:  Allergies  Allergen Reactions   Crestor [Rosuvastatin] Other (See Comments)    Severe myalgias    Current Outpatient Medications  Medication Sig Dispense Refill   atorvastatin (LIPITOR) 80 MG tablet Take 1 tablet (80 mg total) by mouth at bedtime. 90 tablet 1   benazepril (LOTENSIN) 20 MG  tablet Take 1 tablet (20 mg total) by mouth daily. 90 tablet 1   fluticasone (FLONASE) 50 MCG/ACT nasal spray Place 2 sprays into both nostrils daily. 16 g 6   metFORMIN (GLUCOPHAGE) 500 MG tablet Take 1 tablet (500 mg total) by mouth daily with breakfast. 180 tablet 0   Multiple Vitamins-Minerals (MULTIVITAMIN PO) Take 1 tablet by mouth daily. Over 50 mvi qd     No current facility-administered medications for this visit.    OBJECTIVE: Vitals:   12/07/22 1050  BP: 135/85  Pulse: 71  Resp: 16  Temp: (!) 97.5 F (36.4 C)  SpO2: 97%     Body mass index is 32.64 kg/m.    ECOG FS:0 - Asymptomatic  General: Well-developed, well-nourished, no acute distress. Eyes: Pink conjunctiva, anicteric sclera. HEENT: Normocephalic, moist mucous membranes. Lungs: No audible wheezing or coughing. Heart: Regular rate and rhythm. Abdomen: Soft, nontender, no obvious distention. Musculoskeletal: No edema, cyanosis, or clubbing. Neuro: Alert, answering all questions appropriately. Cranial nerves grossly intact. Skin: No rashes or petechiae noted. Psych: Normal affect. Lymphatics: No cervical, calvicular, axillary or inguinal LAD.   LAB RESULTS:  Lab Results  Component Value Date   NA 140 10/06/2022   K 4.4 10/06/2022   CL 105 10/06/2022   CO2 25 10/06/2022   GLUCOSE 117 (H) 10/06/2022   BUN 11 10/06/2022   CREATININE 0.71 10/06/2022   CALCIUM 9.6 10/06/2022   PROT 7.1 10/06/2022   ALBUMIN 4.6 07/05/2016   AST 21 10/06/2022   ALT 45 10/06/2022   ALKPHOS 68 07/05/2016   BILITOT 0.7 10/06/2022   GFRNONAA >60 01/25/2022   GFRAA 115 05/11/2020    Lab Results  Component Value Date   WBC 6.4 10/06/2022   NEUTROABS 3,910 10/06/2022   HGB 15.4 10/06/2022   HCT 43.8 10/06/2022   MCV 86.1 10/06/2022   PLT 177 10/06/2022     STUDIES: No results found.  ASSESSMENT: Noninvasive bladder cancer.  PLAN:    Noninvasive bladder cancer: Patient has his next surgery with urology  scheduled for Dec 25, 2022.  He will benefit from 6 weekly infusions of intravesicular gemcitabine starting approximately 1 month after his procedure.  Patient will return to clinic on January 26, 2023 for further evaluation and initiation of cycle 1.  I spent a total of 60 minutes reviewing chart data, face-to-face evaluation with the patient, counseling and coordination of care as detailed above.   Patient expressed understanding and was in agreement with this plan. He also understands that He can call clinic at any time with any questions, concerns, or complaints.    Cancer Staging  Malignant neoplasm of bladder Staging form: Urinary Bladder, AJCC 8th Edition - Clinical stage from 12/07/2022: Stage 0is (cTis, cN0, cM0) - Signed by Jeralyn Ruths, MD on 12/07/2022   Jeralyn Ruths, MD   12/07/2022 12:07 PM

## 2022-12-08 ENCOUNTER — Other Ambulatory Visit: Payer: Self-pay

## 2022-12-09 LAB — CULTURE, URINE COMPREHENSIVE

## 2022-12-15 ENCOUNTER — Encounter
Admission: RE | Admit: 2022-12-15 | Discharge: 2022-12-15 | Disposition: A | Discharge status: Home / Self Care | Payer: BC Managed Care – PPO | Source: Ambulatory Visit | Attending: Urology | Admitting: Urology

## 2022-12-15 DIAGNOSIS — E1165 Type 2 diabetes mellitus with hyperglycemia: Secondary | ICD-10-CM

## 2022-12-15 DIAGNOSIS — Z01810 Encounter for preprocedural cardiovascular examination: Secondary | ICD-10-CM

## 2022-12-15 DIAGNOSIS — Z01818 Encounter for other preprocedural examination: Secondary | ICD-10-CM

## 2022-12-15 DIAGNOSIS — I10 Essential (primary) hypertension: Secondary | ICD-10-CM

## 2022-12-15 NOTE — Patient Instructions (Signed)
Your procedure is scheduled on:12-25-22 Monday Report to the Registration Desk on the 1st floor of the [facility].Then proceed to the 2nd floor Surgery Desk To find out your arrival time, please call [phone] between 1PM - 3PM on:12-22-22 Friday If your arrival time is 6:00 am, do not arrive before that time as the Medical Mall entrance doors do not open until 6:00 am.  REMEMBER: Instructions that are not followed completely may result in serious medical risk, up to and including death; or upon the discretion of your surgeon and anesthesiologist your surgery may need to be rescheduled.  Do not eat food OR drink any liquids after midnight the night before surgery.  No gum chewing or hard candies.  One week prior to surgery:Last dose will be on 12-17-22 Stop Anti-inflammatories (NSAIDS) such as Advil, Aleve, Ibuprofen, Motrin, Naproxen, Naprosyn and Aspirin based products such as Excedrin, Goody's Powder, BC Powder.You may however, take Tylenol if needed for pain up until the day of surgery. Stop ANY OVER THE COUNTER supplements/vitamins 7 days prior (Multivitamin)  Do NOT take any medication the day of surgery  Continue your benazepril (LOTENSIN) up until the day prior to surgery-Do NOT take the morning of surgery  Stop your metFORMIN (GLUCOPHAGE) 2 days prior to surgery-Last dose will be on 12-22-22 Friday  No Alcohol for 24 hours before or after surgery.  No Smoking including e-cigarettes for 24 hours before surgery.  No chewable tobacco products for at least 6 hours before surgery.  No nicotine patches on the day of surgery.  Do not use any "recreational" drugs for at least a week (preferably 2 weeks) before your surgery.  Please be advised that the combination of cocaine and anesthesia may have negative outcomes, up to and including death. If you test positive for cocaine, your surgery will be cancelled.  On the morning of surgery brush your teeth with toothpaste and water, you may  rinse your mouth with mouthwash if you wish. Do not swallow any toothpaste or mouthwash.  Do not wear jewelry, make-up, hairpins, clips or nail polish.  Do not wear lotions, powders, or perfumes.   Do not shave body hair from the neck down 48 hours before surgery.  Contact lenses, hearing aids and dentures may not be worn into surgery.  Do not bring valuables to the hospital. [facility] is not responsible for any missing/lost belongings or valuables.   Notify your doctor if there is any change in your medical condition (cold, fever, infection).  Wear comfortable clothing (specific to your surgery type) to the hospital.  After surgery, you can help prevent lung complications by doing breathing exercises.  Take deep breaths and cough every 1-2 hours. Your doctor may order a device called an Incentive Spirometer to help you take deep breaths. When coughing or sneezing, hold a pillow firmly against your incision with both hands. This is called "splinting." Doing this helps protect your incision. It also decreases belly discomfort.  If you are being admitted to the hospital overnight, leave your suitcase in the car. After surgery it may be brought to your room.  In case of increased patient census, it may be necessary for you, the patient, to continue your postoperative care in the Same Day Surgery department.  If you are being discharged the day of surgery, you will not be allowed to drive home. You will need a responsible individual to drive you home and stay with you for 24 hours after surgery.   If you  are taking public transportation, you will need to have a responsible individual with you.  Please call the Pre-admissions Testing Dept. at [phone] if you have any questions about these instructions.  Surgery Visitation Policy:  Patients having surgery or a procedure may have two visitors.  Children under the age of 88 must have an adult with them who is not the patient.Marland Kitchen

## 2022-12-18 ENCOUNTER — Encounter
Admission: RE | Admit: 2022-12-18 | Discharge: 2022-12-18 | Disposition: A | Discharge status: Home / Self Care | Payer: BC Managed Care – PPO | Source: Ambulatory Visit | Attending: Urology | Admitting: Urology

## 2022-12-18 DIAGNOSIS — I10 Essential (primary) hypertension: Secondary | ICD-10-CM | POA: Insufficient documentation

## 2022-12-18 DIAGNOSIS — E1165 Type 2 diabetes mellitus with hyperglycemia: Secondary | ICD-10-CM | POA: Insufficient documentation

## 2022-12-18 DIAGNOSIS — Z01818 Encounter for other preprocedural examination: Secondary | ICD-10-CM | POA: Diagnosis not present

## 2022-12-18 DIAGNOSIS — Z01810 Encounter for preprocedural cardiovascular examination: Secondary | ICD-10-CM | POA: Diagnosis not present

## 2022-12-25 ENCOUNTER — Encounter: Payer: Self-pay | Admitting: Urology

## 2022-12-25 ENCOUNTER — Ambulatory Visit: Payer: BC Managed Care – PPO

## 2022-12-25 ENCOUNTER — Other Ambulatory Visit: Payer: Self-pay

## 2022-12-25 ENCOUNTER — Ambulatory Visit
Admission: RE | Admit: 2022-12-25 | Discharge: 2022-12-25 | Disposition: A | Discharge status: Home / Self Care | Payer: BC Managed Care – PPO | Source: Ambulatory Visit | Attending: Urology | Admitting: Urology

## 2022-12-25 ENCOUNTER — Encounter
Admission: RE | Disposition: A | Discharge status: Home / Self Care | Payer: Self-pay | Source: Ambulatory Visit | Attending: Urology

## 2022-12-25 ENCOUNTER — Ambulatory Visit: Payer: BC Managed Care – PPO | Admitting: Urgent Care

## 2022-12-25 DIAGNOSIS — Z09 Encounter for follow-up examination after completed treatment for conditions other than malignant neoplasm: Secondary | ICD-10-CM | POA: Insufficient documentation

## 2022-12-25 DIAGNOSIS — C672 Malignant neoplasm of lateral wall of bladder: Secondary | ICD-10-CM | POA: Diagnosis not present

## 2022-12-25 DIAGNOSIS — Z79899 Other long term (current) drug therapy: Secondary | ICD-10-CM | POA: Diagnosis not present

## 2022-12-25 DIAGNOSIS — D494 Neoplasm of unspecified behavior of bladder: Secondary | ICD-10-CM

## 2022-12-25 DIAGNOSIS — I10 Essential (primary) hypertension: Secondary | ICD-10-CM | POA: Diagnosis not present

## 2022-12-25 DIAGNOSIS — Z01818 Encounter for other preprocedural examination: Secondary | ICD-10-CM

## 2022-12-25 DIAGNOSIS — Z87891 Personal history of nicotine dependence: Secondary | ICD-10-CM | POA: Diagnosis not present

## 2022-12-25 DIAGNOSIS — Z7984 Long term (current) use of oral hypoglycemic drugs: Secondary | ICD-10-CM | POA: Insufficient documentation

## 2022-12-25 DIAGNOSIS — E119 Type 2 diabetes mellitus without complications: Secondary | ICD-10-CM | POA: Insufficient documentation

## 2022-12-25 DIAGNOSIS — C679 Malignant neoplasm of bladder, unspecified: Secondary | ICD-10-CM | POA: Insufficient documentation

## 2022-12-25 DIAGNOSIS — Z8551 Personal history of malignant neoplasm of bladder: Secondary | ICD-10-CM | POA: Diagnosis not present

## 2022-12-25 HISTORY — PX: BLADDER INSTILLATION: SHX6893

## 2022-12-25 HISTORY — PX: CYSTOSCOPY W/ RETROGRADES: SHX1426

## 2022-12-25 HISTORY — PX: TRANSURETHRAL RESECTION OF BLADDER TUMOR: SHX2575

## 2022-12-25 LAB — GLUCOSE, CAPILLARY
Glucose-Capillary: 117 mg/dL — ABNORMAL HIGH (ref 70–99)
Glucose-Capillary: 126 mg/dL — ABNORMAL HIGH (ref 70–99)

## 2022-12-25 SURGERY — TURBT (TRANSURETHRAL RESECTION OF BLADDER TUMOR)
Anesthesia: General

## 2022-12-25 MED ORDER — SODIUM CHLORIDE 0.9 % IV SOLN: INTRAVENOUS | Status: DC

## 2022-12-25 MED ORDER — HYDROCODONE-ACETAMINOPHEN 5-325 MG PO TABS: 1.0000 | ORAL_TABLET | Freq: Four times a day (QID) | ORAL | 0 refills | Status: DC | PRN

## 2022-12-25 MED ORDER — FENTANYL CITRATE (PF) 100 MCG/2ML IJ SOLN
INTRAMUSCULAR | Status: AC
  Filled 2022-12-25: qty 2

## 2022-12-25 MED ORDER — PROPOFOL 10 MG/ML IV BOLUS
INTRAVENOUS | Status: DC | PRN
  Administered 2022-12-25: 50 mg via INTRAVENOUS
  Administered 2022-12-25: 150 mg via INTRAVENOUS

## 2022-12-25 MED ORDER — GEMCITABINE CHEMO FOR BLADDER INSTILLATION 2000 MG
2000.0000 mg | Freq: Once | INTRAVENOUS | Status: DC
  Filled 2022-12-25: qty 52.6

## 2022-12-25 MED ORDER — ORAL CARE MOUTH RINSE: 15.0000 mL | Freq: Once | OROMUCOSAL | Status: AC

## 2022-12-25 MED ORDER — DROPERIDOL 2.5 MG/ML IJ SOLN: 0.6250 mg | Freq: Once | INTRAMUSCULAR | Status: DC | PRN

## 2022-12-25 MED ORDER — MIDAZOLAM HCL 2 MG/2ML IJ SOLN
INTRAMUSCULAR | Status: AC
  Filled 2022-12-25: qty 2

## 2022-12-25 MED ORDER — CHLORHEXIDINE GLUCONATE 0.12 % MT SOLN
OROMUCOSAL | Status: AC
  Filled 2022-12-25: qty 15

## 2022-12-25 MED ORDER — LABETALOL HCL 5 MG/ML IV SOLN
10.0000 mg | Freq: Once | INTRAVENOUS | Status: AC
  Administered 2022-12-25: 10 mg via INTRAVENOUS

## 2022-12-25 MED ORDER — CEFAZOLIN SODIUM-DEXTROSE 2-4 GM/100ML-% IV SOLN
2.0000 g | INTRAVENOUS | Status: AC
  Administered 2022-12-25: 2 g via INTRAVENOUS

## 2022-12-25 MED ORDER — FENTANYL CITRATE (PF) 100 MCG/2ML IJ SOLN
INTRAMUSCULAR | Status: DC | PRN
  Administered 2022-12-25: 50 ug via INTRAVENOUS
  Administered 2022-12-25 (×2): 25 ug via INTRAVENOUS

## 2022-12-25 MED ORDER — MIDAZOLAM HCL 2 MG/2ML IJ SOLN
INTRAMUSCULAR | Status: DC | PRN
  Administered 2022-12-25: 2 mg via INTRAVENOUS

## 2022-12-25 MED ORDER — CEFAZOLIN SODIUM-DEXTROSE 2-4 GM/100ML-% IV SOLN
INTRAVENOUS | Status: AC
  Filled 2022-12-25: qty 100

## 2022-12-25 MED ORDER — LIDOCAINE HCL (CARDIAC) PF 100 MG/5ML IV SOSY
PREFILLED_SYRINGE | INTRAVENOUS | Status: DC | PRN
  Administered 2022-12-25: 80 mg via INTRAVENOUS

## 2022-12-25 MED ORDER — OXYBUTYNIN CHLORIDE 5 MG PO TABS: 5.0000 mg | ORAL_TABLET | Freq: Three times a day (TID) | ORAL | 0 refills | Status: DC | PRN

## 2022-12-25 MED ORDER — CHLORHEXIDINE GLUCONATE 0.12 % MT SOLN
15.0000 mL | Freq: Once | OROMUCOSAL | Status: AC
  Administered 2022-12-25: 15 mL via OROMUCOSAL

## 2022-12-25 MED ORDER — FAMOTIDINE 20 MG PO TABS
20.0000 mg | ORAL_TABLET | Freq: Once | ORAL | Status: AC
  Administered 2022-12-25: 20 mg via ORAL

## 2022-12-25 MED ORDER — IOHEXOL 180 MG/ML  SOLN
INTRAMUSCULAR | Status: DC | PRN
  Administered 2022-12-25: 20 mL

## 2022-12-25 MED ORDER — ONDANSETRON HCL 4 MG/2ML IJ SOLN
INTRAMUSCULAR | Status: DC | PRN
  Administered 2022-12-25: 5 mg via INTRAVENOUS

## 2022-12-25 MED ORDER — GEMCITABINE CHEMO FOR BLADDER INSTILLATION 2000 MG
INTRAVENOUS | Status: DC | PRN
  Administered 2022-12-25: 2000 mg via INTRAVESICAL

## 2022-12-25 MED ORDER — LABETALOL HCL 5 MG/ML IV SOLN
INTRAVENOUS | Status: AC
  Filled 2022-12-25: qty 4

## 2022-12-25 MED ORDER — FAMOTIDINE 20 MG PO TABS
ORAL_TABLET | ORAL | Status: AC
  Filled 2022-12-25: qty 1

## 2022-12-25 MED ORDER — FENTANYL CITRATE (PF) 100 MCG/2ML IJ SOLN
25.0000 ug | INTRAMUSCULAR | Status: DC | PRN
  Administered 2022-12-25: 50 ug via INTRAVENOUS
  Administered 2022-12-25 (×2): 25 ug via INTRAVENOUS

## 2022-12-25 MED ORDER — DEXAMETHASONE SODIUM PHOSPHATE 10 MG/ML IJ SOLN
INTRAMUSCULAR | Status: DC | PRN
  Administered 2022-12-25: 5 mg via INTRAVENOUS

## 2022-12-25 MED ORDER — SUCCINYLCHOLINE CHLORIDE 200 MG/10ML IV SOSY
PREFILLED_SYRINGE | INTRAVENOUS | Status: AC
  Filled 2022-12-25: qty 10

## 2022-12-25 MED ORDER — ONDANSETRON HCL 4 MG/2ML IJ SOLN
INTRAMUSCULAR | Status: AC
  Filled 2022-12-25: qty 6

## 2022-12-25 MED ORDER — STERILE WATER FOR IRRIGATION IR SOLN
Status: DC | PRN
  Administered 2022-12-25: 3000 mL

## 2022-12-25 SURGICAL SUPPLY — 37 items
BAG DRAIN SIEMENS DORNER NS (MISCELLANEOUS) ×2 IMPLANT
BAG DRN NS LF (MISCELLANEOUS) ×2
BAG DRN RND TRDRP ANRFLXCHMBR (UROLOGICAL SUPPLIES) ×2
BAG URINE DRAIN 2000ML AR STRL (UROLOGICAL SUPPLIES) ×2 IMPLANT
BRUSH SCRUB EZ  4% CHG (MISCELLANEOUS) ×2
BRUSH SCRUB EZ 1% IODOPHOR (MISCELLANEOUS) ×2 IMPLANT
BRUSH SCRUB EZ 4% CHG (MISCELLANEOUS) ×2 IMPLANT
CATH FOLEY 2WAY  5CC 16FR (CATHETERS) ×2
CATH FOLEY 2WAY 5CC 16FR (CATHETERS) ×2
CATH URETL OPEN 5X70 (CATHETERS) ×2 IMPLANT
CATH URTH 16FR FL 2W BLN LF (CATHETERS) ×2 IMPLANT
DRAPE UTILITY 15X26 TOWEL STRL (DRAPES) ×2 IMPLANT
DRSG TELFA 3X4 N-ADH STERILE (GAUZE/BANDAGES/DRESSINGS) ×2 IMPLANT
ELECT LOOP 22F BIPOLAR SML (ELECTROSURGICAL)
ELECT REM PT RETURN 9FT ADLT (ELECTROSURGICAL)
ELECTRODE LOOP 22F BIPOLAR SML (ELECTROSURGICAL) IMPLANT
ELECTRODE REM PT RTRN 9FT ADLT (ELECTROSURGICAL) IMPLANT
GAUZE 4X4 16PLY ~~LOC~~+RFID DBL (SPONGE) ×4 IMPLANT
GLOVE BIO SURGEON STRL SZ 6.5 (GLOVE) ×2 IMPLANT
GOWN STRL REUS W/ TWL LRG LVL3 (GOWN DISPOSABLE) ×4 IMPLANT
GOWN STRL REUS W/TWL LRG LVL3 (GOWN DISPOSABLE) ×4
GUIDEWIRE STR DUAL SENSOR (WIRE) ×2 IMPLANT
IV NS IRRIG 3000ML ARTHROMATIC (IV SOLUTION) ×2 IMPLANT
KIT TURNOVER CYSTO (KITS) ×2 IMPLANT
LOOP CUT BIPOLAR 24F LRG (ELECTROSURGICAL) IMPLANT
NDL SAFETY ECLIP 18X1.5 (MISCELLANEOUS) ×2 IMPLANT
PACK CYSTO AR (MISCELLANEOUS) ×2 IMPLANT
PAD ARMBOARD 7.5X6 YLW CONV (MISCELLANEOUS) ×2 IMPLANT
SET CYSTO W/LG BORE CLAMP LF (SET/KITS/TRAYS/PACK) ×2 IMPLANT
SET IRRIG Y TYPE TUR BLADDER L (SET/KITS/TRAYS/PACK) ×2 IMPLANT
SURGILUBE 2OZ TUBE FLIPTOP (MISCELLANEOUS) ×2 IMPLANT
SYR TOOMEY IRRIG 70ML (MISCELLANEOUS) ×2
SYRINGE TOOMEY IRRIG 70ML (MISCELLANEOUS) ×2 IMPLANT
TRAP FLUID SMOKE EVACUATOR (MISCELLANEOUS) ×2 IMPLANT
WATER STERILE IRR 1000ML POUR (IV SOLUTION) ×2 IMPLANT
WATER STERILE IRR 3000ML UROMA (IV SOLUTION) IMPLANT
WATER STERILE IRR 500ML POUR (IV SOLUTION) ×2 IMPLANT

## 2022-12-25 NOTE — Anesthesia Preprocedure Evaluation (Signed)
Anesthesia Evaluation  Patient identified by MRN, date of birth, ID band Patient awake    Reviewed: Allergy & Precautions, H&P , NPO status , Patient's Chart, lab work & pertinent test results, reviewed documented beta blocker date and time   History of Anesthesia Complications Negative for: history of anesthetic complications  Airway Mallampati: III  TM Distance: >3 FB Neck ROM: full    Dental  (+) Teeth Intact, Dental Advidsory Given   Pulmonary neg pulmonary ROS, former smoker   Pulmonary exam normal        Cardiovascular Exercise Tolerance: Good hypertension, On Medications (-) angina (-) Past MI and (-) Cardiac Stents negative cardio ROS Normal cardiovascular exam(-) dysrhythmias (-) Valvular Problems/Murmurs Rate:Normal     Neuro/Psych negative neurological ROS  negative psych ROS   GI/Hepatic negative GI ROS, Neg liver ROS,,,  Endo/Other  diabetes, Well Controlled, Type 2, Oral Hypoglycemic Agents    Renal/GU negative Renal ROS  negative genitourinary   Musculoskeletal   Abdominal   Peds  Hematology negative hematology ROS (+)   Anesthesia Other Findings Past Medical History: 07/16/2013: Abnormal EKG No date: Arthritis No date: Cancer [facility])     Comment:  bladder 02/04/2015: Cervical spine disease 07/16/2013: Chest pain No date: Diabetes mellitus without complication (HCC) No date: History of kidney stones No date: Hyperglycemia No date: Hyperlipidemia No date: Hypertension 02/04/2015: Neck pain on left side 02/04/2015: Numbness of fingers 02/04/2015: Paresthesia of left arm 12/09/2014: Screening for prostate cancer No date: Sessile colonic polyp   Reproductive/Obstetrics negative OB ROS                             Anesthesia Physical Anesthesia Plan  ASA: 2  Anesthesia Plan: General LMA and General   Post-op Pain Management:    Induction: Intravenous  PONV  Risk Score and Plan: Ondansetron, Dexamethasone, Midazolam and Treatment may vary due to age or medical condition  Airway Management Planned: LMA  Additional Equipment:   Intra-op Plan:   Post-operative Plan: Extubation in OR  Informed Consent: I have reviewed the patients History and Physical, chart, labs and discussed the procedure including the risks, benefits and alternatives for the proposed anesthesia with the patient or authorized representative who has indicated his/her understanding and acceptance.       Plan Discussed with: CRNA  Anesthesia Plan Comments:         Anesthesia Quick Evaluation

## 2022-12-25 NOTE — Transfer of Care (Signed)
Immediate Anesthesia Transfer of Care Note  Patient: Ian Leblanc  Procedure(s) Performed: TRANSURETHRAL RESECTION OF BLADDER TUMOR (TURBT) CYSTOSCOPY WITH RETROGRADE PYELOGRAM (Bilateral) BLADDER INSTILLATION OF GEMCITABINE  Patient Location: PACU  Anesthesia Type:General  Level of Consciousness: awake  Airway & Oxygen Therapy: Patient Spontanous Breathing and Patient connected to face mask oxygen  Post-op Assessment: Report given to RN and Post -op Vital signs reviewed and stable  Post vital signs: Reviewed and stable  Last Vitals:  Vitals Value Taken Time  BP 169/109 12/25/22 1319  Temp 36.4 C 12/25/22 1319  Pulse 96 12/25/22 1321  Resp 22 12/25/22 1321  SpO2 96 % 12/25/22 1321  Vitals shown include unvalidated device data.  Last Pain:  Vitals:   12/25/22 1129  PainSc: 0-No pain         Complications: No notable events documented.

## 2022-12-25 NOTE — Op Note (Signed)
Date of procedure: 12/25/22  Preoperative diagnosis:  History of bladder cancer Left lateral bladder tumor  Postoperative diagnosis:        1. Same as above  Procedure: TURBT, small Bilateral retrograde pyelogram Instillation of intra gemcitabine  Surgeon: Vanna Scotland, MD  Anesthesia: General  Complications: None  Intraoperative findings: Very small less than half centimeter papillary tumor on the left lateral bladder wall.  Several other biopsies including small area of erythema on the right lateral bladder wall as well as bladder neck biopsy.  Bilateral retrogrades were normal.  EBL: Minimal  Specimens: Left bladder, right bladder, bladder neck biopsies  Drains: 16 French Foley catheter  Indication: Ian Leblanc is a 62 y.o. patient with personal history of recurrent low-grade superficial bladder cancer found to have another small recurrence at the periphery of his previous TUR site and left lateral bladder wall.  There is also several other suspicious lesions.  After reviewing the management options for treatment, he elected to proceed with the above surgical procedure(s). We have discussed the potential benefits and risks of the procedure, side effects of the proposed treatment, the likelihood of the patient achieving the goals of the procedure, and any potential problems that might occur during the procedure or recuperation. Informed consent has been obtained.  Description of procedure:  The patient was taken to the operating room and general anesthesia was induced.  The patient was placed in the dorsal lithotomy position, prepped and draped in the usual sterile fashion, and preoperative antibiotics were administered. A preoperative time-out was performed.   A 21 French cystoscope was advanced per urethra into the bladder.  Attention was turned to the left lateral bladder wall where previous stellate scar could be seen.  There was a small papillary tumor at the 5 o'clock  position, this measured less than 1.5 cm.  There is also an area of erythema adjacent but not involving the right UO at the location of a previous tumor site as well which was not obvious tumor but warranted biopsy.  The remainder of the bladder was unremarkable, mild trabeculation noted.  The prostatic fossa was irregular and there is also a pedunculated area at the 7 o'clock position at the bladder neck which was also biopsied.  It is in close proximity to the previous TUR resection site.  Was unclear whether this represented edema, irritation, or possibly recurrent tumor.  At first, bilateral retrograde pyelograms were performed by intubating the UOs consecutively using a 5 [place] open-ended ureteral catheter.  A gentle retrograde pyelogram on either side showed normal decompressed upper tracts without hydroureteronephrosis or filling defects.  Next, the tumor on the left lateral bladder wall was resected using a cold cup biopsy forcep.  I then biopsy the erythematous area in the right lateral bladder wall as well as the pedunculated area of the bladder neck, each send is individual specimens.  Bugbee electrocautery was used to achieve hemostasis of each of these.  A final inspection revealed excellent hemostasis and no additional tumor.  I used a 70 degree lens to further evaluate the bladder neck to ensure that all suspicious areas had been adequately cleared.  No additional tumor remained.  Finally a 59 French Foley catheter was placed with 10 cc of sterile water.  The patient was then cleaned and dried, repositioned in the supine position, reversed from anesthesia, and taken to the PACU in stable condition.  2000 mg of intravesical gemcitabine was instilled into the bladder.  This is allowed to dwell  for an hour.  At the end of an hour, the chemotherapy was drained and the Foley was removed.  This was well-tolerated.  He will begin weekly intravesical gemcitabine treatments for his recurrent low-grade  superficial bladder malignancy.  At the end of the 6 weeks cycle, we will plan for reassessment with cystoscopy 3 months thereafter.  Vanna Scotland, M.D.

## 2022-12-25 NOTE — Interval H&P Note (Signed)
History and Physical Interval Note:  12/25/2022 12:16 PM  Ian Leblanc  has presented today for surgery, with the diagnosis of Bladder Tumor.  The various methods of treatment have been discussed with the patient and family. After consideration of risks, benefits and other options for treatment, the patient has consented to  Procedure(s): TRANSURETHRAL RESECTION OF BLADDER TUMOR (TURBT) (N/A) CYSTOSCOPY WITH RETROGRADE PYELOGRAM (Bilateral) BLADDER INSTILLATION OF GEMCITABINE (N/A) as a surgical intervention.  The patient's history has been reviewed, patient examined, no change in status, stable for surgery.  I have reviewed the patient's chart and labs.  Questions were answered to the patient's satisfaction.    RRR CTAB  Vanna Scotland

## 2022-12-25 NOTE — Discharge Instructions (Addendum)

## 2022-12-25 NOTE — Anesthesia Procedure Notes (Signed)
Procedure Name: LMA Insertion Date/Time: 12/25/2022 12:39 PM  Performed by: Lily Lovings, CRNAPre-anesthesia Checklist: Patient identified, Emergency Drugs available, Suction available, Patient being monitored and Timeout performed Patient Re-evaluated:Patient Re-evaluated prior to induction Oxygen Delivery Method: Circle system utilized Preoxygenation: Pre-oxygenation with 100% oxygen Induction Type: IV induction LMA: LMA inserted LMA Size: 5.0 Number of attempts: 1 Placement Confirmation: positive ETCO2 and breath sounds checked- equal and bilateral Tube secured with: Tape Dental Injury: Teeth and Oropharynx as per pre-operative assessment  Comments: Inserted by Hassel Neth

## 2022-12-26 ENCOUNTER — Encounter: Payer: Self-pay | Admitting: Urology

## 2022-12-26 LAB — SURGICAL PATHOLOGY

## 2022-12-26 NOTE — Anesthesia Postprocedure Evaluation (Signed)
Anesthesia Post Note  Patient: Ian Leblanc  Procedure(s) Performed: TRANSURETHRAL RESECTION OF BLADDER TUMOR (TURBT) CYSTOSCOPY WITH RETROGRADE PYELOGRAM (Bilateral) BLADDER INSTILLATION OF GEMCITABINE  Patient location during evaluation: PACU Anesthesia Type: General Level of consciousness: awake and alert Pain management: pain level controlled Vital Signs Assessment: post-procedure vital signs reviewed and stable Respiratory status: spontaneous breathing, nonlabored ventilation, respiratory function stable and patient connected to nasal cannula oxygen Cardiovascular status: blood pressure returned to baseline and stable Postop Assessment: no apparent nausea or vomiting Anesthetic complications: no   No notable events documented.   Last Vitals:  Vitals:   12/25/22 1450 12/25/22 1511  BP: (!) 185/102 (!) 174/84  Pulse: 85   Resp: 16   Temp: (!) 36.4 C   SpO2: 96%     Last Pain:  Vitals:   12/25/22 1450  TempSrc: Temporal  PainSc: 1                  Lenard Simmer

## 2023-01-01 ENCOUNTER — Telehealth: Payer: Self-pay | Admitting: Urology

## 2023-01-01 ENCOUNTER — Encounter: Payer: Self-pay | Admitting: Urology

## 2023-01-01 ENCOUNTER — Other Ambulatory Visit: Payer: Self-pay | Admitting: Oncology

## 2023-01-01 NOTE — Telephone Encounter (Signed)
Patient surgical pathology this time returned consistent with low-grade noninvasive bladder cancer but there is also a tumor with high-grade noninvasive as well as CIS of the bladder.  As such, he would be better served with BCG rather than gemcitabine as previously discussed and planned.  .  I called the patient to discuss with this with him.  I will also reach out to Dr. Orlie Dakin that his team to let him know and thank him for his help with care coordination with this patient.  We discussed the potential side effects of BCG at length including risk of urgency frequency bladder discomfort, dysuria as well as very low likelihood of BCG sepsis.  Will plan for BCG x 6 in our office to begin in a few weeks (4-6 weeks after surgery).  Vanna Scotland, MD

## 2023-01-03 ENCOUNTER — Inpatient Hospital Stay: Payer: BC Managed Care – PPO | Attending: Oncology | Admitting: Hospice and Palliative Medicine

## 2023-01-03 DIAGNOSIS — C679 Malignant neoplasm of bladder, unspecified: Secondary | ICD-10-CM

## 2023-01-03 NOTE — Progress Notes (Signed)
Multidisciplinary Oncology Council Documentation  Ian Leblanc was presented by our Ian Leblanc on 01/03/2023, which included representatives from:  Palliative Care Dietitian  Physical/Occupational Therapist Nurse Navigator Genetics Speech Therapist Social work Survivorship RN Financial Navigator Research RN   Ian Leblanc currently presents with history of bladder cancer  We reviewed previous medical and familial history, history of present illness, and recent lab results along with all available histopathologic and imaging studies. The MOC considered available treatment options and made the following recommendations/referrals:  None  The MOC is a meeting of clinicians from various specialty areas who evaluate and discuss patients for whom a multidisciplinary approach is being considered. Final determinations in the plan of care are those of the provider(s).   Today's extended care, comprehensive team conference, Ian Leblanc was not present for the discussion and was not examined.

## 2023-01-05 ENCOUNTER — Telehealth: Payer: Self-pay

## 2023-01-05 ENCOUNTER — Ambulatory Visit: Payer: BC Managed Care – PPO | Admitting: Internal Medicine

## 2023-01-05 VITALS — BP 124/94 | HR 96 | Temp 98.1°F | Resp 18 | Ht 70.0 in | Wt 219.6 lb

## 2023-01-05 DIAGNOSIS — R051 Acute cough: Secondary | ICD-10-CM | POA: Diagnosis not present

## 2023-01-05 DIAGNOSIS — R509 Fever, unspecified: Secondary | ICD-10-CM | POA: Diagnosis not present

## 2023-01-05 DIAGNOSIS — J069 Acute upper respiratory infection, unspecified: Secondary | ICD-10-CM | POA: Diagnosis not present

## 2023-01-05 DIAGNOSIS — R82998 Other abnormal findings in urine: Secondary | ICD-10-CM

## 2023-01-05 DIAGNOSIS — Z8603 Personal history of neoplasm of uncertain behavior: Secondary | ICD-10-CM

## 2023-01-05 DIAGNOSIS — R519 Headache, unspecified: Secondary | ICD-10-CM

## 2023-01-05 DIAGNOSIS — Z9889 Other specified postprocedural states: Secondary | ICD-10-CM | POA: Diagnosis not present

## 2023-01-05 DIAGNOSIS — M791 Myalgia, unspecified site: Secondary | ICD-10-CM

## 2023-01-05 DIAGNOSIS — N3090 Cystitis, unspecified without hematuria: Secondary | ICD-10-CM

## 2023-01-05 LAB — CBC WITH DIFFERENTIAL/PLATELET
Absolute Monocytes: 1107 cells/uL — ABNORMAL HIGH (ref 200–950)
Basophils Absolute: 62 cells/uL (ref 0–200)
Eosinophils Absolute: 98 cells/uL (ref 15–500)
Eosinophils Relative: 0.8 %
HCT: 40.2 % (ref 38.5–50.0)
Hemoglobin: 13.8 g/dL (ref 13.2–17.1)
MCHC: 34.3 g/dL (ref 32.0–36.0)
MCV: 87 fL (ref 80.0–100.0)
Neutro Abs: 8745 cells/uL — ABNORMAL HIGH (ref 1500–7800)
RBC: 4.62 10*6/uL (ref 4.20–5.80)
Total Lymphocyte: 18.6 %

## 2023-01-05 LAB — POCT URINALYSIS DIPSTICK
Bilirubin, UA: NEGATIVE
Blood, UA: POSITIVE
Glucose, UA: NEGATIVE
Ketones, UA: NEGATIVE
Nitrite, UA: NEGATIVE
Odor: NORMAL
Protein, UA: NEGATIVE
Spec Grav, UA: 1.015 (ref 1.010–1.025)
Urobilinogen, UA: 0.2 E.U./dL
pH, UA: 6.5 (ref 5.0–8.0)

## 2023-01-05 NOTE — Progress Notes (Signed)
Established Patient Office Visit  Subjective   Patient ID: Ian Leblanc, male    DOB: 1960-10-03  Age: 62 y.o. MRN: [id number]  Chief Complaint  Patient presents with   URI    Fatigue, cough,headache, nasal congestion and low grade fever. Onset 1 week    URI  Associated symptoms include congestion and coughing. Pertinent negatives include no abdominal pain, chest pain, diarrhea, dysuria, headaches, nausea, sinus pain, sore throat, vomiting or wheezing.   Patient recently had a TURBT on 12/25/22 and was diagnosed with new type of bladder cancer for him. Underwent chemotherapy treatment that same day. Patient first noticed URI symptoms and extreme fatigue about 1 week ago.  Symptoms started with diarrhea, feeling hot and cold and fatigue, now more URI-like symptoms.  URI Compliant:  -Fever: no, elevated temperatures to 99.9, sweated through sheets -Cough: yes, dry  -Shortness of breath: no -Wheezing: no -Chest pain: yes, with cough -Nasal congestion: yes -Runny nose: no -Sore throat: no -Sinus pressure: yes -Headache: yes -Ear pain: yes left -Vomiting: no -Fatigue: yes -Context: fluctuating   Review of Systems  Constitutional:  Positive for malaise/fatigue. Negative for chills and fever.  HENT:  Positive for congestion. Negative for sinus pain and sore throat.   Eyes:  Negative for blurred vision.  Respiratory:  Positive for cough. Negative for sputum production, shortness of breath and wheezing.   Cardiovascular:  Negative for chest pain.  Gastrointestinal:  Negative for abdominal pain, diarrhea, nausea and vomiting.  Genitourinary:  Positive for hematuria. Negative for dysuria, frequency and urgency.  Neurological:  Negative for headaches.      Objective:     BP (!) 124/94   Pulse 96   Temp 98.1 F (36.7 C)   Resp 18   Ht 5\' 10"  (1.778 m)   Wt 219 lb 9.6 oz (99.6 kg)   SpO2 97%   BMI 31.51 kg/m  BP Readings from Last 3 Encounters:  01/05/23 (!) 124/94   12/25/22 (!) 174/84  12/07/22 135/85   Wt Readings from Last 3 Encounters:  01/05/23 219 lb 9.6 oz (99.6 kg)  12/25/22 218 lb (98.9 kg)  12/07/22 221 lb (100.2 kg)      Physical Exam Constitutional:      Appearance: Normal appearance.  HENT:     Head: Normocephalic and atraumatic.     Right Ear: Ear canal and external ear normal.     Left Ear: Ear canal and external ear normal.     Ears:     Comments: Bilateral eustachian tube dysfunction present    Nose: Congestion present.     Mouth/Throat:     Mouth: Mucous membranes are moist.     Pharynx: Oropharynx is clear.  Eyes:     Conjunctiva/sclera: Conjunctivae normal.  Cardiovascular:     Rate and Rhythm: Normal rate and regular rhythm.  Pulmonary:     Effort: Pulmonary effort is normal.     Breath sounds: Normal breath sounds. No wheezing, rhonchi or rales.  Musculoskeletal:     Cervical back: No tenderness.  Lymphadenopathy:     Cervical: No cervical adenopathy.  Skin:    General: Skin is warm and dry.  Neurological:     General: No focal deficit present.     Mental Status: He is alert. Mental status is at baseline.  Psychiatric:        Mood and Affect: Mood normal.        Behavior: Behavior normal.      No  results found for any visits on 01/05/23.  Last CBC Lab Results  Component Value Date   WBC 6.4 10/06/2022   HGB 15.4 10/06/2022   HCT 43.8 10/06/2022   MCV 86.1 10/06/2022   MCH 30.3 10/06/2022   RDW 12.2 10/06/2022   PLT 177 10/06/2022   Last metabolic panel Lab Results  Component Value Date   GLUCOSE 117 (H) 10/06/2022   NA 140 10/06/2022   K 4.4 10/06/2022   CL 105 10/06/2022   CO2 25 10/06/2022   BUN 11 10/06/2022   CREATININE 0.71 10/06/2022   GFRNONAA >60 01/25/2022   CALCIUM 9.6 10/06/2022   PROT 7.1 10/06/2022   ALBUMIN 4.6 07/05/2016   BILITOT 0.7 10/06/2022   ALKPHOS 68 07/05/2016   AST 21 10/06/2022   ALT 45 10/06/2022   ANIONGAP 6 01/25/2022   Last lipids Lab Results   Component Value Date   CHOL 145 10/06/2022   HDL 43 10/06/2022   LDLCALC 78 10/06/2022   TRIG 137 10/06/2022   CHOLHDL 3.4 10/06/2022   Last hemoglobin A1c Lab Results  Component Value Date   HGBA1C 6.8 (H) 10/06/2022   Last thyroid functions No results found for: "TSH", "T3TOTAL", "T4TOTAL", "THYROIDAB" Last vitamin D No results found for: "25OHVITD2", "25OHVITD3", "VD25OH" Last vitamin B12 and Folate No results found for: "VITAMINB12", "FOLATE"    The 10-year ASCVD risk score (Arnett DK, et al., 2019) is: 19%    Assessment & Plan:   1. Viral upper respiratory tract infection/Acute cough/Acute nonintractable headache, unspecified headache type/Myalgia/Elevated temperature: Symptoms consistent with viral illness but will obtain labs to check CBC, electrolytes. COVID test pending. For now treat with decongestant and anti-inflammatories.   - CBC w/Diff/Platelet - Basic Metabolic Panel (BMET) - Novel Coronavirus, NAA ([facility])  2. History of transurethral resection of bladder tumor (TURBT)/Leukocytes in urine: UA in the office showing leukocytes and blood, could be from procedure on the 5/6, will send for culture.   - POCT Urinalysis Dipstick - Urine Culture   Return if symptoms worsen or fail to improve.    Margarita Mail, DO

## 2023-01-05 NOTE — Patient Instructions (Addendum)
It was great seeing you today!  Plan discussed at today's visit: -Blood work ordered today, results will be uploaded to MyChart.  -Urine test ordered today as well -COVID test pending -For now, recommend taking Ibuprofen as needed for elevated temperatures/body aches and Coricidin decongestant for sinus pressure and congestion   Follow up in: if symptoms worsen or fail to improve  Take care and let us know if you have any questions or concerns prior to your next visit.  Dr. Caralee Ates

## 2023-01-05 NOTE — Telephone Encounter (Signed)
Pt called after hours nurse, agree with triage. No urologic f/u needed. See media.

## 2023-01-06 LAB — CBC WITH DIFFERENTIAL/PLATELET
Basophils Relative: 0.5 %
Lymphs Abs: 2288 cells/uL (ref 850–3900)
MCH: 29.9 pg (ref 27.0–33.0)
MPV: 11.3 fL (ref 7.5–12.5)
Monocytes Relative: 9 %
Neutrophils Relative %: 71.1 %
Platelets: 180 10*3/uL (ref 140–400)
RDW: 12.3 % (ref 11.0–15.0)
WBC: 12.3 10*3/uL — ABNORMAL HIGH (ref 3.8–10.8)

## 2023-01-06 LAB — BASIC METABOLIC PANEL
BUN: 10 mg/dL (ref 7–25)
CO2: 25 mmol/L (ref 20–32)
Calcium: 9.2 mg/dL (ref 8.6–10.3)
Chloride: 103 mmol/L (ref 98–110)
Creat: 0.73 mg/dL (ref 0.70–1.35)
Glucose, Bld: 85 mg/dL (ref 65–99)
Potassium: 3.9 mmol/L (ref 3.5–5.3)
Sodium: 138 mmol/L (ref 135–146)

## 2023-01-06 LAB — NOVEL CORONAVIRUS, NAA: SARS-CoV-2, NAA: NOT DETECTED

## 2023-01-07 LAB — URINE CULTURE
MICRO NUMBER:: 14973001
SPECIMEN QUALITY:: ADEQUATE

## 2023-01-08 MED ORDER — NITROFURANTOIN MONOHYD MACRO 100 MG PO CAPS
100.0000 mg | ORAL_CAPSULE | Freq: Two times a day (BID) | ORAL | 0 refills | Status: AC
Start: 2023-01-08 — End: 2023-01-15

## 2023-01-08 NOTE — Addendum Note (Signed)
Addended by: Margarita Mail on: 01/08/2023 07:24 AM   Modules accepted: Orders

## 2023-01-26 ENCOUNTER — Ambulatory Visit: Payer: BC Managed Care – PPO

## 2023-01-26 ENCOUNTER — Other Ambulatory Visit: Payer: BC Managed Care – PPO

## 2023-01-26 ENCOUNTER — Ambulatory Visit: Payer: BC Managed Care – PPO | Admitting: Oncology

## 2023-02-16 ENCOUNTER — Ambulatory Visit: Payer: BC Managed Care – PPO | Admitting: Physician Assistant

## 2023-02-16 DIAGNOSIS — C679 Malignant neoplasm of bladder, unspecified: Secondary | ICD-10-CM

## 2023-02-16 DIAGNOSIS — D494 Neoplasm of unspecified behavior of bladder: Secondary | ICD-10-CM

## 2023-02-16 LAB — URINALYSIS, COMPLETE
Bilirubin, UA: NEGATIVE
Glucose, UA: NEGATIVE
Leukocytes,UA: NEGATIVE
Nitrite, UA: NEGATIVE
RBC, UA: NEGATIVE
Specific Gravity, UA: >1.03 — ABNORMAL HIGH (ref 1.005–1.030)
Urobilinogen, Ur: 1 mg/dL (ref 0.2–1.0)
pH, UA: 5.5 (ref 5.0–7.5)

## 2023-02-16 LAB — MICROSCOPIC EXAMINATION

## 2023-02-16 MED ORDER — BCG LIVE 50 MG IS SUSR
3.2400 mL | Freq: Once | INTRAVESICAL | Status: AC
Start: 2023-02-16 — End: 2023-02-16
  Administered 2023-02-16: 81 mg via INTRAVESICAL

## 2023-02-16 NOTE — Progress Notes (Signed)
BCG Bladder Instillation  BCG # 1 of 6  Due to Bladder Cancer patient is present today for a BCG treatment. Patient was cleaned and prepped in a sterile fashion with betadine. A 14FR catheter was inserted, urine return was noted 10ml, urine was yellow in color.  50ml of reconstituted BCG was instilled into the bladder. The catheter was then removed. Patient tolerated well, no complications were noted  Performed by: Carman Ching, PA-C and Humberta Magallon-Mariche, CMA  Additional notes: Patient remained in clinic for 30 minutes following instillation today for monitoring of hypersensitivity reaction; none noted.  We reviewed post-instillation instructions including holding the urine for 2 hours with quarter turns every 15 minutes and pouring bleach into the toilet with subsequent voids for 6 hours. Written instructions also provided today. He expressed understanding.   Follow up: 1 week for BCG #2 of 6

## 2023-02-16 NOTE — Patient Instructions (Signed)
Your Timeline for Today:  Right now through 11:10am: Hold your urine and do your quarter turns every 15 minutes. 11:10am-5:10pm today: Every time you urinate, pour 1/2 cup of bleach into the toilet and let it sit for 15 minutes prior to flushing. 5:10pm onward: Resume your normal routine.   Patient Education: (BCG) Into the Bladder (Intravesical Chemotherapy)  BCG is a vaccine which is used to prevent tuberculosis (TB).  But it's also a helpful treatment for some early bladder cancers.  When BCG goes directly into the bladder the treatment is described as intravesical.  BCG is a type of immunotherapy.  Immunotherapy stimulates the body's immune system to destroy cancer cells.  How it's given BCG treatment is given to you in an outpatient setting.  It takes a few minutes to administer and you can go home as soon as it's finished.  It might be a good idea to ask someone to bring you, particularly the fist time.  Unlike chemotherapy into the bladder, BCG treatment is never given immediately after surgery to remove bladder tumors.  There needs to be a delay usually of at least two weeks after surgery, before you can have it.  You won't be given treatment with BCG if you are unwell or have an infection in your urine.  You're usually asked to limit the amount you drink before your treatment.  This will help to increase the concentration of BCG in your bladder.  Drinking too much before your treatment may make your bladder feel uncomfortably full.  If you normally take water tablets (diuretics) take them later in the day after your treatment.  Your nurse or doctor will give you more advise about preparing for your treatment.  You will have a small tube (catheter) placed into your bladder.  Your doctor will then put the liquid vaccine directly into your bladder through the catheter and remove the catheter.  You will need to hold your urine for two hours afterwards.  Rotating every 15 minutes from side to  side. This can be difficult but it's to give the treatment time to work.  When the treatment is over you can go to the toilet.  After your treatment there are some precautions you'll need to take.  This is because BCG is a live vaccine and other people shouldn't be exposed to it.  For the next six hours, you'll need to avoid your urine splashing on the toilet seat and getting any urine on your hands.  It might be easer for men to sit down when they're using an ordinary toilet although using a stand up urinal should be alright.  The main this is to avoid splashing urine and spreading the vaccine.  You will also be asked to put 1/2 cup undiluted bleach into the toilet to destroy any live vaccine and leave it for 15 minutes until you flush for the next 6 voids.  Side Effects Because BCG goes directly into the bladder most of the side effects are linked with the bladder.  They usually go away within one to two days after your treatment.  The most common ones are: -needing to pass urine often -pain when you pass urine -blood in urine -flu-like symptoms (tiredness, general aching and raised temperature)  Theses side effects should settle down within a day or two.  If they don't get better contact your doctor.  Drinking lots of fluids can help flush the drug out of your bladder and reduce some of these effects.  Taking Ibuprofen or Aleve is encouraged unless you have a condition that would make these medications unsafe to take (renal failure, diabetes, gerd)  Rare side effects can include a continuing high temperature (fever), pain in your joints and a cough.  If you have any of these symptoms, or if you feel generally unwell, contact your doctor.  These symptoms could be a sign of a more serious infection (due to BCG) that needs to be treated immediately.  If this happens you'll be treated with the same drugs (antibiotics) that are used to treat TB.  Contraception Men should use a condom during sex for  the first 48 hours after their treatment.  If you are a women who has had BCG treatment then your partner should use a condom.  Using a condom will protect your partner from any vaccine present in your semen or vaginal fluid.  We don't know how BCG may affect a developing fetus so it's not advisable to become pregnant or father a child while having it.  It is important to use effective contraception during your treatment and for six weeks afterwards.  You can discuss this with your doctor or specialist nurse.

## 2023-02-23 ENCOUNTER — Ambulatory Visit: Payer: BC Managed Care – PPO | Admitting: Physician Assistant

## 2023-02-27 ENCOUNTER — Ambulatory Visit: Payer: BC Managed Care – PPO | Admitting: Physician Assistant

## 2023-02-27 DIAGNOSIS — C679 Malignant neoplasm of bladder, unspecified: Secondary | ICD-10-CM | POA: Diagnosis not present

## 2023-02-27 DIAGNOSIS — D494 Neoplasm of unspecified behavior of bladder: Secondary | ICD-10-CM

## 2023-02-27 LAB — URINALYSIS, COMPLETE
Bilirubin, UA: NEGATIVE
Glucose, UA: NEGATIVE
Ketones, UA: NEGATIVE
Leukocytes,UA: NEGATIVE
Nitrite, UA: NEGATIVE
Protein,UA: NEGATIVE
RBC, UA: NEGATIVE
Specific Gravity, UA: <1.005 — ABNORMAL LOW (ref 1.005–1.030)
Urobilinogen, Ur: 0.2 mg/dL (ref 0.2–1.0)
pH, UA: 6 (ref 5.0–7.5)

## 2023-02-27 LAB — MICROSCOPIC EXAMINATION
Bacteria, UA: NONE SEEN
Epithelial Cells (non renal): NONE SEEN /hpf (ref 0–10)

## 2023-02-27 MED ORDER — BCG LIVE 50 MG IS SUSR
3.24 mL | Freq: Once | INTRAVESICAL | Status: AC
Start: 2023-02-27 — End: 2023-02-27
  Administered 2023-02-27: 81 mg via INTRAVESICAL

## 2023-02-27 NOTE — Patient Instructions (Signed)
Your Timeline for Today:  Right now through 3:35pm: Hold your urine and do your quarter turns every 15 minutes. 3:35pm-9:35pm today: Every time you urinate, pour 1/2 cup of bleach into the toilet and let it sit for 15 minutes prior to flushing. 9:35pm onward: Resume your normal routine.

## 2023-02-27 NOTE — Progress Notes (Signed)
BCG Bladder Instillation  BCG # 2 of 6  Due to Bladder Cancer patient is present today for a BCG treatment. Patient was cleaned and prepped in a sterile fashion with betadine. A 14FR catheter was inserted, urine return was noted 3ml, urine was yellow in color.  50ml of reconstituted BCG was instilled into the bladder. The catheter was then removed. Patient tolerated well, no complications were noted  Performed by: Carman Ching, PA-C and [place] Pink, CMA  Follow up/ Additional notes: 1 week for BCG #3

## 2023-03-02 ENCOUNTER — Ambulatory Visit: Payer: BC Managed Care – PPO | Admitting: Physician Assistant

## 2023-03-02 DIAGNOSIS — D494 Neoplasm of unspecified behavior of bladder: Secondary | ICD-10-CM | POA: Diagnosis not present

## 2023-03-02 LAB — MICROSCOPIC EXAMINATION: Bacteria, UA: NONE SEEN

## 2023-03-02 LAB — URINALYSIS, COMPLETE
Bilirubin, UA: NEGATIVE
Ketones, UA: NEGATIVE
Leukocytes,UA: NEGATIVE
Nitrite, UA: NEGATIVE
Protein,UA: NEGATIVE
RBC, UA: NEGATIVE
Specific Gravity, UA: 1.02 (ref 1.005–1.030)
Urobilinogen, Ur: 0.2 mg/dL (ref 0.2–1.0)
pH, UA: 6 (ref 5.0–7.5)

## 2023-03-02 MED ORDER — BCG LIVE 50 MG IS SUSR
3.2400 mL | Freq: Once | INTRAVESICAL | Status: AC
Start: 2023-03-02 — End: 2023-03-02
  Administered 2023-03-02: 81 mg via INTRAVESICAL

## 2023-03-02 NOTE — Progress Notes (Signed)
BCG Bladder Instillation  BCG # 3 of 6  Due to Bladder Cancer patient is present today for a BCG treatment. Patient was cleaned and prepped in a sterile fashion with betadine. A 14FR catheter was inserted, urine return was noted 25ml, urine was yellow in color.  50ml of reconstituted BCG was instilled into the bladder. The catheter was then removed. Patient tolerated well, no complications were noted  Performed by: Carman Ching, PA-C and [place] Pink, RMA  Follow up/ Additional notes: 1 week for BCG #4 of 6

## 2023-03-09 ENCOUNTER — Ambulatory Visit: Payer: BC Managed Care – PPO | Admitting: Physician Assistant

## 2023-03-09 ENCOUNTER — Encounter: Payer: Self-pay | Admitting: Physician Assistant

## 2023-03-09 DIAGNOSIS — D494 Neoplasm of unspecified behavior of bladder: Secondary | ICD-10-CM

## 2023-03-09 DIAGNOSIS — C679 Malignant neoplasm of bladder, unspecified: Secondary | ICD-10-CM

## 2023-03-09 LAB — MICROSCOPIC EXAMINATION
Bacteria, UA: NONE SEEN
Epithelial Cells (non renal): NONE SEEN /hpf (ref 0–10)

## 2023-03-09 LAB — URINALYSIS, COMPLETE
Bilirubin, UA: NEGATIVE
Leukocytes,UA: NEGATIVE
Nitrite, UA: NEGATIVE
Protein,UA: NEGATIVE
RBC, UA: NEGATIVE
Specific Gravity, UA: 1.025 (ref 1.005–1.030)
Urobilinogen, Ur: 0.2 mg/dL (ref 0.2–1.0)
pH, UA: 5.5 (ref 5.0–7.5)

## 2023-03-09 MED ORDER — BCG LIVE 50 MG IS SUSR
3.2400 mL | Freq: Once | INTRAVESICAL | Status: AC
Start: 2023-03-09 — End: 2023-03-09
  Administered 2023-03-09: 81 mg via INTRAVESICAL

## 2023-03-09 NOTE — Progress Notes (Signed)
BCG Bladder Instillation  BCG # 4 of 6  Due to Bladder Cancer patient is present today for a BCG treatment. Patient was cleaned and prepped in a sterile fashion with betadine. A 14FR catheter was inserted, urine return was noted , urine was yellow in color.  50ml of reconstituted BCG was instilled into the bladder. The catheter was then removed. Patient tolerated well, no complications were noted  Performed by: Carman Ching, PA-C and [place] Pink, CMA  Follow up/ Additional notes: 1 week for BCG #5

## 2023-03-16 ENCOUNTER — Ambulatory Visit: Payer: BC Managed Care – PPO | Admitting: Physician Assistant

## 2023-03-16 VITALS — BP 162/97 | HR 82 | Ht 70.0 in | Wt 219.6 lb

## 2023-03-16 DIAGNOSIS — C679 Malignant neoplasm of bladder, unspecified: Secondary | ICD-10-CM | POA: Diagnosis not present

## 2023-03-16 DIAGNOSIS — D494 Neoplasm of unspecified behavior of bladder: Secondary | ICD-10-CM

## 2023-03-16 LAB — URINALYSIS, COMPLETE
Bilirubin, UA: NEGATIVE
Leukocytes,UA: NEGATIVE
Nitrite, UA: NEGATIVE
Protein,UA: NEGATIVE
Specific Gravity, UA: 1.02 (ref 1.005–1.030)
Urobilinogen, Ur: 0.2 mg/dL (ref 0.2–1.0)
pH, UA: 5.5 (ref 5.0–7.5)

## 2023-03-16 LAB — MICROSCOPIC EXAMINATION: Bacteria, UA: NONE SEEN

## 2023-03-16 MED ORDER — BCG LIVE 50 MG IS SUSR
3.2400 mL | Freq: Once | INTRAVESICAL | Status: AC
Start: 2023-03-16 — End: 2023-03-16
  Administered 2023-03-16: 81 mg via INTRAVESICAL

## 2023-03-16 NOTE — Progress Notes (Signed)
BCG Bladder Instillation  BCG # 5 of 6  Due to Bladder Cancer patient is present today for a BCG treatment. Patient was cleaned and prepped in a sterile fashion with betadine. A 14FR catheter was inserted, urine return was noted 5ml, urine was yellow in color.  50ml of reconstituted BCG was instilled into the bladder. The catheter was then removed. Patient tolerated well, no complications were noted  Performed by: Carman Ching, PA-C and Humberta Magallon-Mariche, CMA  Follow up: 1 week for BCG #6 of 6

## 2023-03-23 ENCOUNTER — Ambulatory Visit: Payer: BC Managed Care – PPO | Admitting: Physician Assistant

## 2023-03-23 VITALS — BP 156/78 | HR 70 | Ht 70.0 in | Wt 219.0 lb

## 2023-03-23 DIAGNOSIS — C679 Malignant neoplasm of bladder, unspecified: Secondary | ICD-10-CM | POA: Diagnosis not present

## 2023-03-23 DIAGNOSIS — D494 Neoplasm of unspecified behavior of bladder: Secondary | ICD-10-CM

## 2023-03-23 LAB — URINALYSIS, COMPLETE
Bilirubin, UA: NEGATIVE
Glucose, UA: NEGATIVE
Ketones, UA: NEGATIVE
Nitrite, UA: NEGATIVE
Protein,UA: NEGATIVE
RBC, UA: NEGATIVE
Specific Gravity, UA: <1.005 — ABNORMAL LOW (ref 1.005–1.030)
Urobilinogen, Ur: 0.2 mg/dL (ref 0.2–1.0)
pH, UA: 6 (ref 5.0–7.5)

## 2023-03-23 LAB — MICROSCOPIC EXAMINATION
Bacteria, UA: NONE SEEN
Epithelial Cells (non renal): NONE SEEN /hpf (ref 0–10)

## 2023-03-23 MED ORDER — BCG LIVE 50 MG IS SUSR
3.2400 mL | Freq: Once | INTRAVESICAL | Status: AC
Start: 2023-03-23 — End: 2023-03-23
  Administered 2023-03-23: 81 mg via INTRAVESICAL

## 2023-03-23 NOTE — Progress Notes (Signed)
BCG Bladder Instillation  BCG # 6 of 6  Due to Bladder Cancer patient is present today for a BCG treatment. Patient was cleaned and prepped in a sterile fashion with betadine. A 14FR catheter was inserted, urine return was noted , urine was yellow in color.  50ml of reconstituted BCG was instilled into the bladder. The catheter was then removed. Patient tolerated well, no complications were noted  Performed by: Carman Ching, PA-C and Humberta Magallon-Mariche, CMA  Follow up/ Additional notes: 6 weeks cysto with Dr. Apolinar Junes

## 2023-04-11 NOTE — Progress Notes (Unsigned)
Established Patient Office Visit  Subjective   Patient ID: Ian Leblanc, male    DOB: [birth year]  Age: 62 y.o. MRN: [id number]  No chief complaint on file.   HPI Patient is here for follow up on chronic medical conditions. Patient had the new COVID vaccine the beginning of January and feels like he has had sinus pressure/congestion, rhinorrhea and dry cough since then. Denies fevers. He is not taking his anti-histamine but is using Flonase.  Hypertension: -Medications: Benazepril 20 mg -Patient is compliant with above medications and reports no side effects. -Checking BP at home (average): doesn't check  -Denies any SOB, CP, vision changes, LE edema or symptoms of hypotension  HLD: -Medications: Lipitor 80  mg -Patient is compliant with above medications and reports no side effects.  -Last lipid panel: Lipid Panel     Component Value Date/Time   CHOL 145 10/06/2022 1204   TRIG 137 10/06/2022 1204   HDL 43 10/06/2022 1204   CHOLHDL 3.4 10/06/2022 1204   VLDL 20 07/05/2016 0843   LDLCALC 78 10/06/2022 1204   The 10-year ASCVD risk score (Arnett DK, et al., 2019) is: 27.3%   Values used to calculate the score:     Age: 62 years     Sex: Male     Is Non-Hispanic African American: No     Diabetic: Yes     Tobacco smoker: No     Systolic Blood Pressure: 156 mmHg     Is BP treated: Yes     HDL Cholesterol: 43 mg/dL     Total Cholesterol: 145 mg/dL  Diabetes, Type 2: -Last A1c 2/24 6.8% -Medications: Metformin 500 mg once daily  -Patient is compliant with the above medications and reports no side effects.  -Checking BG at home: No -Eye exam: UTD April  -Foot exam: Due -Microalbumin: Due -Statin: yes -PNA vaccine: Prevnar 23 in 2015, due -Denies symptoms of hypoglycemia, polyuria, polydipsia, numbness extremities, foot ulcers/trauma.   Seasonal Allergies: -Currently Flonase, not taking Zyrtec   Health Maintenance: -Blood work UTD -Colonoscopy 07/2018 repeat in 10  years    Review of Systems  Constitutional:  Negative for chills and fever.  HENT:  Positive for congestion. Negative for sinus pain and sore throat.   Eyes:  Negative for blurred vision.  Respiratory:  Positive for cough. Negative for sputum production and shortness of breath.   Cardiovascular:  Negative for chest pain.  Gastrointestinal:  Negative for abdominal pain, diarrhea, nausea and vomiting.  Neurological:  Negative for headaches.      Objective:     There were no vitals taken for this visit. BP Readings from Last 3 Encounters:  03/23/23 (!) 156/78  03/16/23 (!) 162/97  01/05/23 (!) 124/94   Wt Readings from Last 3 Encounters:  03/23/23 219 lb (99.3 kg)  03/16/23 219 lb 9.6 oz (99.6 kg)  01/05/23 219 lb 9.6 oz (99.6 kg)      Physical Exam Constitutional:      Appearance: Normal appearance.  HENT:     Head: Normocephalic and atraumatic.     Right Ear: Tympanic membrane, ear canal and external ear normal.     Left Ear: Tympanic membrane, ear canal and external ear normal.     Nose:     Comments: Inflammation in the right inferior turbinate     Mouth/Throat:     Mouth: Mucous membranes are moist.     Comments: PND present  Eyes:     Conjunctiva/sclera: Conjunctivae normal.  Cardiovascular:     Rate and Rhythm: Normal rate and regular rhythm.  Pulmonary:     Effort: Pulmonary effort is normal.     Breath sounds: Normal breath sounds.  Skin:    General: Skin is warm and dry.  Neurological:     General: No focal deficit present.     Mental Status: He is alert. Mental status is at baseline.  Psychiatric:        Mood and Affect: Mood normal.        Behavior: Behavior normal.      No results found for any visits on 04/12/23.  Last CBC Lab Results  Component Value Date   WBC 12.3 (H) 01/05/2023   HGB 13.8 01/05/2023   HCT 40.2 01/05/2023   MCV 87.0 01/05/2023   MCH 29.9 01/05/2023   RDW 12.3 01/05/2023   PLT 180 01/05/2023   Last metabolic  panel Lab Results  Component Value Date   GLUCOSE 85 01/05/2023   NA 138 01/05/2023   K 3.9 01/05/2023   CL 103 01/05/2023   CO2 25 01/05/2023   BUN 10 01/05/2023   CREATININE 0.73 01/05/2023   GFRNONAA >60 01/25/2022   CALCIUM 9.2 01/05/2023   PROT 7.1 10/06/2022   ALBUMIN 4.6 07/05/2016   BILITOT 0.7 10/06/2022   ALKPHOS 68 07/05/2016   AST 21 10/06/2022   ALT 45 10/06/2022   ANIONGAP 6 01/25/2022   Last lipids Lab Results  Component Value Date   CHOL 145 10/06/2022   HDL 43 10/06/2022   LDLCALC 78 10/06/2022   TRIG 137 10/06/2022   CHOLHDL 3.4 10/06/2022   Last hemoglobin A1c Lab Results  Component Value Date   HGBA1C 6.8 (H) 10/06/2022   Last thyroid functions No results found for: "TSH", "T3TOTAL", "T4TOTAL", "THYROIDAB" Last vitamin D No results found for: "25OHVITD2", "25OHVITD3", "VD25OH" Last vitamin B12 and Folate No results found for: "VITAMINB12", "FOLATE"    The 10-year ASCVD risk score (Arnett DK, et al., 2019) is: 27.3%    Assessment & Plan:   1. Hypertension, unspecified type: Chronic and stable.  Continue benazepril 20 mg daily, refilled today.  Due for annual labs including CBC and CMP.  - CBC w/Diff/Platelet - COMPLETE METABOLIC PANEL WITH GFR - benazepril (LOTENSIN) 20 MG tablet; Take 1 tablet (20 mg total) by mouth daily.  Dispense: 90 tablet; Refill: 1  2. Mixed hyperlipidemia: Recheck fasting labs today.  Continue Lipitor 80 mg daily, refilled. - Lipid Profile - atorvastatin (LIPITOR) 80 MG tablet; Take 1 tablet (80 mg total) by mouth at bedtime.  Dispense: 90 tablet; Refill: 1  3. Type 2 diabetes mellitus without complication, without long-term current use of insulin (HCC): Controlled.  Recheck A1c today.  Continue metformin 500 mg daily.  - HgB A1c - metFORMIN (GLUCOPHAGE) 500 MG tablet; Take 1 tablet (500 mg total) by mouth daily with breakfast.  Dispense: 180 tablet; Refill: 0  4. Prostate cancer screening: Check PSA today.  -  PSA  5. Vaccine for streptococcus pneumoniae and influenza: Prevnar 20 administered.  - Pneumococcal conjugate vaccine 20-valent (Prevnar 20)  6. Sinus pressure: Continue using nasal steroid, recommend he restart his Zyrtec.  Will treat with Medrol Dosepak as symptoms have been going on for several weeks.  - methylPREDNISolone (MEDROL DOSEPAK) 4 MG TBPK tablet; Day 1: Take 8 mg (2 tablets) before breakfast, 4 mg (1 tablet) after lunch, 4 mg (1 tablet) after supper, and 8 mg (2 tablets) at bedtime. Day 2:Take 4 mg (  1 tablet) before breakfast, 4 mg (1 tablet) after lunch, 4 mg (1 tablet) after supper, and 8 mg (2 tablets) at bedtime. Day 3: Take 4 mg (1 tablet) before breakfast, 4 mg (1 tablet) after lunch, 4 mg (1 tablet) after supper, and 4 mg (1 tablet) at bedtime. Day 4: Take 4 mg (1 tablet) before breakfast, 4 mg (1 tablet) after lunch, and 4 mg (1 tablet) at bedtime. Day 5: Take 4 mg (1 tablet) before breakfast and 4 mg (1 tablet) at bedtime. Day 6: Take 4 mg (1 tablet) before breakfast.  Dispense: 1 each; Refill: 0   No follow-ups on file.    Margarita Mail, DO

## 2023-04-12 ENCOUNTER — Ambulatory Visit: Payer: BC Managed Care – PPO | Admitting: Internal Medicine

## 2023-04-12 ENCOUNTER — Encounter: Payer: Self-pay | Admitting: Internal Medicine

## 2023-04-12 VITALS — BP 130/82 | HR 73 | Temp 97.8°F | Ht 70.0 in | Wt 224.9 lb

## 2023-04-12 DIAGNOSIS — E119 Type 2 diabetes mellitus without complications: Secondary | ICD-10-CM | POA: Diagnosis not present

## 2023-04-12 DIAGNOSIS — M5432 Sciatica, left side: Secondary | ICD-10-CM | POA: Diagnosis not present

## 2023-04-12 DIAGNOSIS — E782 Mixed hyperlipidemia: Secondary | ICD-10-CM | POA: Diagnosis not present

## 2023-04-12 DIAGNOSIS — M65342 Trigger finger, left ring finger: Secondary | ICD-10-CM

## 2023-04-12 DIAGNOSIS — I10 Essential (primary) hypertension: Secondary | ICD-10-CM

## 2023-04-12 DIAGNOSIS — Z7984 Long term (current) use of oral hypoglycemic drugs: Secondary | ICD-10-CM

## 2023-04-12 LAB — POCT GLYCOSYLATED HEMOGLOBIN (HGB A1C): Hemoglobin A1C: 6.8 % — AB (ref 4.0–5.6)

## 2023-04-12 MED ORDER — TIZANIDINE HCL 4 MG PO TABS
4.0000 mg | ORAL_TABLET | Freq: Every evening | ORAL | 0 refills | Status: DC | PRN
Start: 2023-04-12 — End: 2023-10-19

## 2023-04-12 MED ORDER — ATORVASTATIN CALCIUM 80 MG PO TABS
80.0000 mg | ORAL_TABLET | Freq: Every day | ORAL | 1 refills | Status: DC
Start: 2023-04-12 — End: 2023-12-20

## 2023-04-12 MED ORDER — NAPROXEN 500 MG PO TABS
500.0000 mg | ORAL_TABLET | Freq: Two times a day (BID) | ORAL | 0 refills | Status: AC
Start: 2023-04-12 — End: 2023-04-22

## 2023-04-12 MED ORDER — METFORMIN HCL 500 MG PO TABS: 500.0000 mg | ORAL_TABLET | Freq: Every day | ORAL | 0 refills | Status: DC

## 2023-04-12 MED ORDER — BENAZEPRIL HCL 20 MG PO TABS: 20.0000 mg | ORAL_TABLET | Freq: Every day | ORAL | 1 refills | Status: DC

## 2023-04-12 NOTE — Patient Instructions (Addendum)
It was great seeing you today!  Plan discussed at today's visit: -Medications refilled today -A1c 6.8% today which is good -Take Naproxen 500 mg twice a day with food for 10 days, take muscle relaxer at night -Referral to ortho placed for trigger finger  Follow up in: 6 months, please fast for labs at that time  Take care and let us know if you have any questions or concerns prior to your next visit.  Dr. Caralee Ates  Sciatica  Sciatica is pain, numbness, weakness, or tingling along the path of the sciatic nerve. The sciatic nerve starts in the lower back and runs down the back of each leg. The nerve controls the muscles in the lower leg and in the back of the knee. It also provides feeling (sensation) to the back of the thigh, the lower leg, and the sole of the foot. Sciatica is a symptom of another medical condition that pinches or puts pressure on the sciatic nerve. Sciatica most often only affects one side of the body. Sciatica usually goes away on its own or with treatment. In some cases, sciatica may come back (recur). What are the causes? This condition is caused by pressure on the sciatic nerve or pinching of the nerve. This may be the result of: A disk in between the bones of the spine bulging out too far (herniated disk). Age-related changes in the spinal disks. A pain disorder that affects a muscle in the buttock. Extra bone growth near the sciatic nerve. A break (fracture) of the pelvis. Pregnancy. Tumor. This is rare. What increases the risk? The following factors may make you more likely to develop this condition: Playing sports that place pressure or stress on the spine. Having poor strength and flexibility. A history of back injury or surgery. Sitting for long periods of time. Doing activities that involve repetitive bending or lifting. Obesity. What are the signs or symptoms? Symptoms can vary from mild to very severe. They may include: Any of the following problems  in the lower back, leg, hip, or buttock: Mild tingling, numbness, or dull aches. Burning sensations. Sharp pains. Numbness in the back of the calf or the sole of the foot. Leg weakness. Severe back pain that makes movement difficult. Symptoms may get worse when you cough, sneeze, or laugh, or when you sit or stand for long periods of time. How is this diagnosed? This condition may be diagnosed based on: Your symptoms and medical history. A physical exam. Blood tests. Imaging tests, such as: X-rays. An MRI. A CT scan. How is this treated? In many cases, this condition improves on its own without treatment. However, treatment may include: Reducing or modifying physical activity. Exercising, including strengthening and stretching. Icing and applying heat to the affected area. Medicines that help to: Relieve pain and swelling. Relax your muscles. Injections of medicines that help to relieve pain and inflammation (steroids) around the sciatic nerve. Surgery. Follow these instructions at home: Medicines Take over-the-counter and prescription medicines only as told by your health care provider. Ask your health care provider if the medicine prescribed to you requires you to avoid driving or using heavy machinery. Managing pain     If directed, put ice on the affected area. To do this: Put ice in a plastic bag. Place a towel between your skin and the bag. Leave the ice on for 20 minutes, 2-3 times a day. If your skin turns bright red, remove the ice right away to prevent skin damage. The risk of  skin damage is higher if you cannot feel pain, heat, or cold. If directed, apply heat to the affected area as often as told by your health care provider. Use the heat source that your health care provider recommends, such as a moist heat pack or a heating pad. Place a towel between your skin and the heat source. Leave the heat on for 20-30 minutes. If your skin turns bright red, remove the  heat right away to prevent burns. The risk of burns is higher if you cannot feel pain, heat, or cold. Activity  Return to your normal activities as told by your health care provider. Ask your health care provider what activities are safe for you. Avoid activities that make your symptoms worse. Take brief periods of rest throughout the day. When you rest for longer periods, mix in some mild activity or stretching between periods of rest. This will help to prevent stiffness and pain. Avoid sitting for long periods of time without moving. Get up and move around at least one time each hour. Exercise and stretch regularly as told by your health care provider. Do not lift anything that is heavier than 10 lb (4.5 kg) until your health care provider says that it is safe. When you do not have symptoms, you should still avoid heavy lifting, especially repetitive heavy lifting. When you lift objects, always use proper lifting technique, which includes: Bending your knees. Keeping the load close to your body. Avoiding twisting. General instructions Maintain a healthy weight. Excess weight puts extra stress on your back. Wear supportive, comfortable shoes. Avoid wearing high heels. Avoid sleeping on a mattress that is too soft or too hard. A mattress that is firm enough to support your back when you sleep may help to reduce your pain. Contact a health care provider if: Your pain is not controlled by medicine. Your pain does not improve or gets worse. Your pain lasts longer than 4 weeks. You have unexplained weight loss. Get help right away if: You are not able to control when you urinate or have bowel movements (incontinence). You have: Weakness in your lower back, pelvis, buttocks, or legs that gets worse. Redness or swelling of your back. A burning sensation when you urinate. Summary Sciatica is pain, numbness, weakness, or tingling along the path of the sciatic nerve, which may include the lower  back, legs, hips, and buttocks. This condition is caused by pressure on the sciatic nerve or pinching of the nerve. Treatment often includes rest, exercise, medicines, and applying ice or heat. This information is not intended to replace advice given to you by your health care provider. Make sure you discuss any questions you have with your health care provider. Document Revised: 11/14/2021 Document Reviewed: 11/14/2021 Elsevier Patient Education  2024 [employer]  Sciatica Rehab Ask your health care provider which exercises are safe for you. Do exercises exactly as told by your health care provider and adjust them as directed. It is normal to feel mild stretching, pulling, tightness, or discomfort as you do these exercises. Stop right away if you feel sudden pain or your pain gets worse. Do not begin these exercises until told by your health care provider. Stretching and range-of-motion exercises These exercises warm up your muscles and joints and improve the movement and flexibility of your hips and back. These exercises also help to relieve pain, numbness, and tingling. Sciatic nerve glide  Sit in a chair with your head facing down toward your chest. Place your hands behind  your back. Let your shoulders slump forward. Slowly straighten one of your legs while you tilt your head back as if you are looking toward the ceiling. Only straighten your leg as far as you can without making your symptoms worse. Hold this position for __________ seconds. Slowly return your leg and head back to the starting position. Repeat with your other leg. Repeat __________ times. Complete this exercise __________ times a day. Knee to chest with hip adduction and internal rotation  Lie on your back on a firm surface with both legs straight. Bend one of your knees and move it up toward your chest until you feel a gentle stretch in your lower back and buttock. Then, move your knee toward the shoulder that is on the  opposite side from your leg. This is hip adduction and internal rotation. Hold your leg in this position by holding on to the front of your knee. Hold this position for __________ seconds. Slowly return to the starting position. Repeat with your other leg. Repeat __________ times. Complete this exercise __________ times a day. Prone extension on elbows  Lie on your abdomen on a firm surface. A bed may be too soft for this exercise. Prop yourself up on your elbows. Use your arms to help lift your chest up until you feel a gentle stretch in your abdomen and your lower back. This will place some of your body weight on your elbows. If this is uncomfortable, try stacking pillows under your chest. Your hips should stay down, against the surface that you are lying on. Keep your hip and back muscles relaxed. Hold this position for __________ seconds. Slowly relax your upper body and return to the starting position. Repeat __________ times. Complete this exercise __________ times a day. Strengthening exercises These exercises build strength and endurance in your back. Endurance is the ability to use your muscles for a long time, even after they get tired. Pelvic tilt This exercise strengthens the muscles that lie deep in the abdomen. Lie on your back on a firm surface. Bend your knees and keep your feet flat on the surface. Tense your abdominal muscles. Tip your pelvis up toward the ceiling and flatten your lower back into the firm surface. To help with this exercise, you may place a small towel under your lower back and try to push your back into the towel. Hold this position for __________ seconds. Let your muscles relax completely before you repeat this exercise. Repeat __________ times. Complete this exercise __________ times a day. Alternating arm and leg raises  Get on your hands and knees on a firm surface. If you are on a hard floor, you may want to use padding, such as an exercise mat, to  cushion your knees. Line up your arms and legs. Your hands should be directly below your shoulders, and your knees should be directly below your hips. Lift your left leg behind you. At the same time, raise your right arm and straighten it in front of you. Do not lift your leg higher than your hip. Do not lift your arm higher than your shoulder. Keep your abdominal and back muscles tight. Keep your hips facing the ground. Do not arch your back. Keep your balance carefully, and do not hold your breath. Hold this position for __________ seconds. Slowly return to the starting position. Repeat with your right leg and your left arm. Repeat __________ times. Complete this exercise __________ times a day. Posture and body mechanics Good posture and  healthy body mechanics can help to relieve stress in your body's tissues and joints. Body mechanics refers to the movements and positions of your body while you do your daily activities. Posture is part of body mechanics. Good posture means: Your spine is in its natural S-curve position (neutral). Your shoulders are pulled back slightly. Your head is not tipped forward. Follow these guidelines to improve your posture and body mechanics in your everyday activities. Standing  When standing, keep your spine neutral and your feet about hip width apart. Keep a slight bend in your knees. Your ears, shoulders, and hips should line up. When you do a task in which you stand in one place for a long time, place one foot up on a stable object that is 2-4 inches (5-10 cm) high, such as a footstool. This helps keep your spine neutral. Sitting  When sitting, keep your spine neutral and keep your feet flat on the floor. Use a footrest, if necessary, and keep your thighs parallel to the floor. Avoid rounding your shoulders, and avoid tilting your head forward. When working at a desk or a computer, keep your desk at a height where your hands are slightly lower than your  elbows. Slide your chair under your desk so you are close enough to maintain good posture. When working at a computer, place your monitor at a height where you are looking straight ahead and you do not have to tilt your head forward or downward to look at the screen. Resting  When lying down and resting, avoid positions that are most painful for you. If you have pain with activities such as sitting, bending, stooping, or squatting, lie in a position in which your body does not bend very much. For example, avoid curling up on your side with your arms and knees near your chest (fetal position). If you have pain with activities such as standing for a long time or reaching with your arms, lie with your spine in a neutral position and bend your knees slightly. Try the following positions: Lying on your side with a pillow between your knees. Lying on your back with a pillow under your knees. Lifting  When lifting objects, keep your feet at least shoulder width apart and tighten your abdominal muscles. Bend your knees and hips and keep your spine neutral. It is important to lift using the strength of your legs, not your back. Do not lock your knees straight out. Always ask for help to lift heavy or awkward objects. This information is not intended to replace advice given to you by your health care provider. Make sure you discuss any questions you have with your health care provider. Document Revised: 11/15/2021 Document Reviewed: 11/15/2021 Elsevier Patient Education  2024 [employer].

## 2023-04-13 LAB — MICROALBUMIN / CREATININE URINE RATIO
Creatinine, Urine: 122 mg/dL (ref 20–320)
Microalb Creat Ratio: 6 mg/g{creat} (ref <30)
Microalb, Ur: 0.7 mg/dL

## 2023-04-20 DIAGNOSIS — M65342 Trigger finger, left ring finger: Secondary | ICD-10-CM | POA: Diagnosis not present

## 2023-04-26 ENCOUNTER — Ambulatory Visit: Payer: BC Managed Care – PPO | Admitting: Family Medicine

## 2023-04-26 ENCOUNTER — Encounter: Payer: Self-pay | Admitting: Family Medicine

## 2023-04-26 ENCOUNTER — Ambulatory Visit: Payer: Self-pay

## 2023-04-26 VITALS — BP 144/96 | HR 97 | Temp 97.9°F | Resp 16 | Ht 70.0 in | Wt 224.4 lb

## 2023-04-26 DIAGNOSIS — I10 Essential (primary) hypertension: Secondary | ICD-10-CM

## 2023-04-26 MED ORDER — BENAZEPRIL HCL 40 MG PO TABS
40.0000 mg | ORAL_TABLET | Freq: Every day | ORAL | 1 refills | Status: AC
Start: 2023-04-26 — End: ?

## 2023-04-26 NOTE — Progress Notes (Signed)
Patient ID: Ian Leblanc, male    DOB: 22-May-1961, 62 y.o.   MRN: [id number]  PCP: Margarita Mail, DO  Chief Complaint  Patient presents with   Hypertension    High BP 190/97 today but since Sunday evening has been having high readings at home. Pt states he is compliant with all his meds   Headache    Subjective:   Ian Leblanc is a 62 y.o. male, presents to clinic with CC of the following:  HPI  HTN elevated recently, this morning and over the past week blood pressure readings have been higher up to 190s and diastolic in the 90s In the last couple weeks he has had some joint pain and inflammation, sciatica, a trigger point injection and he has been given a steroid injection NSAIDs and muscle relaxers.  He expected some side effects from some of these medications.  His sciatica has improved Currently he is not having headache chest pain pressure shortness of breath lower extremity edema, vision changes confusion change in urine output. He has been having some slight pressure or pulse noted in his head sometimes at home coinciding with these higher blood pressures. In the past he has had intermittent headaches 1 time he had headache for 3 days and checked his blood pressure his blood pressure was normal so he does think the slight discomfort is related to his higher blood pressure. Less active than he had been, previous years he was able to improve blood pressure control and diabetes by being more active, he states with his cancer diagnosis over the past couple years he has been less active overall and he would like to get back into this  He is currently managed on benazepril 20 mg once daily, good compliance BP elevated above goal today and has been for the past couple months BP Readings from Last 10 Encounters:  04/26/23 (!) 144/96  04/12/23 130/82  03/23/23 (!) 156/78  03/16/23 (!) 162/97  01/05/23 (!) 124/94  12/25/22 (!) 174/84  12/07/22 135/85  12/05/22 (!) 153/113   10/06/22 122/84  04/26/22 (!) 175/95   Weight increased a few lbs in the past 4 months Wt Readings from Last 10 Encounters:  04/26/23 224 lb 6.4 oz (101.8 kg)  04/12/23 224 lb 14.4 oz (102 kg)  03/23/23 219 lb (99.3 kg)  03/16/23 219 lb 9.6 oz (99.6 kg)  01/05/23 219 lb 9.6 oz (99.6 kg)  12/25/22 218 lb (98.9 kg)  12/07/22 221 lb (100.2 kg)  12/05/22 220 lb (99.8 kg)  10/06/22 224 lb 6.4 oz (101.8 kg)  04/26/22 219 lb (99.3 kg)   BMI Readings from Last 5 Encounters:  04/26/23 32.20 kg/m  04/12/23 32.27 kg/m  03/23/23 31.42 kg/m  03/16/23 31.51 kg/m  01/05/23 31.51 kg/m      Patient Active Problem List   Diagnosis Date Noted   Malignant neoplasm of bladder (HCC) 12/07/2022   Chronic rhinitis 08/27/2020   Controlled type 2 diabetes mellitus with hyperglycemia, without long-term current use of insulin (HCC) 07/04/2019   Allergic rhinitis 07/03/2019   Cervicalgia 02/04/2015   Diabetes mellitus type 2, uncomplicated (HCC) 06/08/2014   HTN (hypertension) 10/10/2012   HLD (hyperlipidemia) 10/10/2012   History of colonic polyps 10/10/2012      Current Outpatient Medications:    atorvastatin (LIPITOR) 80 MG tablet, Take 1 tablet (80 mg total) by mouth at bedtime., Disp: 90 tablet, Rfl: 1   benazepril (LOTENSIN) 20 MG tablet, Take 1 tablet (20 mg total) by mouth  daily., Disp: 90 tablet, Rfl: 1   fluticasone (FLONASE) 50 MCG/ACT nasal spray, Place 2 sprays into both nostrils daily., Disp: 16 g, Rfl: 6   HYDROcodone-acetaminophen (NORCO/VICODIN) 5-325 MG tablet, Take 1-2 tablets by mouth every 6 (six) hours as needed for moderate pain., Disp: 5 tablet, Rfl: 0   metFORMIN (GLUCOPHAGE) 500 MG tablet, Take 1 tablet (500 mg total) by mouth daily with breakfast., Disp: 180 tablet, Rfl: 0   Multiple Vitamins-Minerals (MULTIVITAMIN PO), Take 1 tablet by mouth daily. Over 50 mvi qd, Disp: , Rfl:    tiZANidine (ZANAFLEX) 4 MG tablet, Take 1 tablet (4 mg total) by mouth at bedtime as  needed., Disp: 30 tablet, Rfl: 0   Allergies  Allergen Reactions   Crestor [Rosuvastatin] Other (See Comments)    Severe myalgias     Social History   Tobacco Use   Smoking status: Former    Current packs/day: 0.00    Types: Cigarettes    Quit date: 07/17/2003    Years since quitting: 19.7    Passive exposure: Past   Smokeless tobacco: Never  Vaping Use   Vaping status: Never Used  Substance Use Topics   Alcohol use: Yes    Comment: wine occ   Drug use: No      Chart Review Today: I personally reviewed active problem list, medication list, allergies, family history, social history, health maintenance, notes from last encounter, lab results, imaging with the patient/caregiver today.   Review of Systems  Constitutional: Negative.   HENT: Negative.    Eyes: Negative.   Respiratory: Negative.    Cardiovascular: Negative.   Gastrointestinal: Negative.   Endocrine: Negative.   Genitourinary: Negative.   Musculoskeletal: Negative.   Skin: Negative.   Allergic/Immunologic: Negative.   Neurological: Negative.   Hematological: Negative.   Psychiatric/Behavioral: Negative.    All other systems reviewed and are negative.      Objective:   Vitals:   04/26/23 1119  BP: (!) 144/96  Pulse: 97  Resp: 16  Temp: 97.9 F (36.6 C)  TempSrc: Oral  SpO2: 96%  Weight: 224 lb 6.4 oz (101.8 kg)  Height: 5\' 10"  (1.778 m)    Body mass index is 32.2 kg/m.  Physical Exam Vitals and nursing note reviewed.  Constitutional:      General: He is not in acute distress.    Appearance: Normal appearance. He is well-developed. He is obese. He is not ill-appearing, toxic-appearing or diaphoretic.  HENT:     Head: Normocephalic and atraumatic.     Nose: Nose normal.  Eyes:     General:        Right eye: No discharge.        Left eye: No discharge.     Conjunctiva/sclera: Conjunctivae normal.  Neck:     Trachea: No tracheal deviation.  Cardiovascular:     Rate and Rhythm:  Normal rate and regular rhythm.     Pulses: Normal pulses.     Heart sounds: Normal heart sounds. No murmur heard.    No friction rub. No gallop.  Pulmonary:     Effort: Pulmonary effort is normal. No respiratory distress.     Breath sounds: Normal breath sounds. No stridor. No wheezing, rhonchi or rales.  Musculoskeletal:     Right lower leg: No edema.     Left lower leg: No edema.  Skin:    General: Skin is warm and dry.     Findings: No rash.  Neurological:  Mental Status: He is alert. Mental status is at baseline.     Motor: No abnormal muscle tone.     Coordination: Coordination normal.     Gait: Gait normal.  Psychiatric:        Mood and Affect: Mood normal.        Behavior: Behavior normal.      Results for orders placed or performed in visit on 04/12/23  Urine Microalbumin w/creat. ratio  Result Value Ref Range   Creatinine, Urine 122 20 - 320 mg/dL   Microalb, Ur 0.7 mg/dL   Microalb Creat Ratio 6 <30 mg/g creat  POCT HgB A1C  Result Value Ref Range   Hemoglobin A1C 6.8 (A) 4.0 - 5.6 %   HbA1c POC (<> result, manual entry)     HbA1c, POC (prediabetic range)     HbA1c, POC (controlled diabetic range)         Assessment & Plan:   BP high today, uncontrolled, higher readings at home, I rreviewed his VS over the past several months and he has been higher than goal for quite some time, pt will increase benazepril to 40 mg and start to walk and exercise more like he used to.  Close f/up in 3-4 weeks to recheck. Reviewed concerning signs/sx of end organ damage/hypertensive emergency - currently no such signs or sx The slight head discomfort hope to see improve with improved BP control.   ICD-10-CM   1. Hypertension, unspecified type  I10 benazepril (LOTENSIN) 40 MG tablet    Med dose change, increase activity slowly listening to body, avoid NSAIDs, recent MSK issues meds and tx may also be contributing some, would expect to see the med SE wear off in the next week,  close f/up in office  Return for 3-4 week HTN f/up PCP or myself if PCP not available.    Danelle Berry, PA-C 04/26/23 11:42 AM

## 2023-04-26 NOTE — Patient Instructions (Signed)
Recommend taking 40 mg of benazepril daily and working on your exercising and getting active again (listen to your body) and please come in for [place] to do a BP recheck office visit in 3 to 4 weeks so we can make sure your med doses and refills are correct and keeping your BP controlled.  If its not well controlled we will want to maybe adjust your meds to a combination pill (two different medications)

## 2023-04-26 NOTE — Telephone Encounter (Signed)
     Chief Complaint: BP 190/97 Symptoms: Above, mild lightheaded Frequency: Today Pertinent Negatives: Patient denies chest pain or SOB Disposition: [] ED /[] Urgent Care (no appt availability in office) / [x] Appointment(In office/virtual)/ []  [facility]/ [] Home Care/ [] Refused Recommended Disposition /[] [facility]/ []  Follow-up with PCP Additional Notes: Pt. Agrees with appointment today.  Reason for Disposition  Systolic BP  >= 180 OR Diastolic >= 110  Answer Assessment - Initial Assessment Questions 1. BLOOD PRESSURE: "What is the blood pressure?" "Did you take at least two measurements 5 minutes apart?"     190/97 2. ONSET: "When did you take your blood pressure?"     Today 3. HOW: "How did you take your blood pressure?" (e.g., automatic home BP monitor, visiting nurse)     Home cuff 4. HISTORY: "Do you have a history of high blood pressure?"     Yes 5. MEDICINES: "Are you taking any medicines for blood pressure?" "Have you missed any doses recently?"     Yes 6. OTHER SYMPTOMS: "Do you have any symptoms?" (e.g., blurred vision, chest pain, difficulty breathing, headache, weakness)     Light headed  7. PREGNANCY: "Is there any chance you are pregnant?" "When was your last menstrual period?"     N/a  Protocols used: Blood Pressure - High-A-AH

## 2023-05-02 ENCOUNTER — Encounter: Payer: Self-pay | Admitting: Urology

## 2023-05-02 ENCOUNTER — Ambulatory Visit: Payer: BC Managed Care – PPO | Admitting: Urology

## 2023-05-02 VITALS — BP 128/86 | HR 99 | Ht 70.0 in | Wt 220.0 lb

## 2023-05-02 DIAGNOSIS — D494 Neoplasm of unspecified behavior of bladder: Secondary | ICD-10-CM

## 2023-05-02 DIAGNOSIS — Z8551 Personal history of malignant neoplasm of bladder: Secondary | ICD-10-CM

## 2023-05-02 NOTE — Progress Notes (Signed)
   05/02/23  CC: cysto   HPI: Ian Leblanc is a 62 y.o. male with a personal history of kidney stones, gross hematuria with negative workup, bladder cancer, who presents today for 3 month surveillance cystoscopy.   CT urogram on 12/02/2020 showed 1 small area within the bladder, several millimeters in size near the dome which may recheck present filling defect. Otherwise there were no additional upper tract pathology identified. Small incidental pulmonary nodule felt to be likely benign.    He underwent a transurethral resection of bladder tumor with Gemcitabine for malignant neoplasm of lateral wall of urinary bladder on 01/10/2021.    Surgical pathology revealed urinary bladder; transurethral resection: non invasive papillary urothelial carcinoma, low-grade. No muscularis propria identified.   He returned to the operating room in June 2023 with an 8 mm tumor which appeared to be a small satellite recurrence at the edge of the TUR resection bed as well as a 3 mm at the left trigone.  Surgical pathology was consistent with low-grade TA disease.  He did receive postprocedural gentamicin.  He returned to the operating room on 12/2022 with another very small recurrence of the left lateral bladder wall retrograde pyelograms were normal.  Surgical pathology was consistent with low-grade noninvasive bladder cancer as well as an additional tumor with high-grade noninvasive bladder cancer as well as CIS in the bladder.  He completed induction BCG in 03/2023.  NED. A&Ox3.   No respiratory distress   Abd soft, NT, ND Normal phallus with bilateral descended testicles  Cystoscopy Procedure Note  Patient identification was confirmed, informed consent was obtained, and patient was prepped using Betadine solution.  Lidocaine jelly was administered per urethral meatus.     Pre-Procedure: - Inspection reveals a normal caliber ureteral meatus.  Procedure: The flexible cystoscope was introduced without  difficulty - No urethral strictures/lesions are present. - Normal prostate  - Slightly Elevated bladder neck - Bilateral ureteral orifices identified - Bladder mucosa reveals a stellate scar at the left lateral bladder wall, at the 5 o'clock position  - No bladder stones - No trabeculation - stellate scare on left lateral bladder neck area   Retroflexion shows stellate scar near bladder neck as well as small intravesical component of the prostate   Post-Procedure: - Patient tolerated the procedure well   Assessment/ Plan:  History of bladder cancer  -Intermediate risk given recurrence s/p induction BCG -NED today; not enough urine for cytology -Will follow closely with cysto in 3 mo and urine cytology    Vanna Scotland, MD

## 2023-05-03 LAB — URINALYSIS, COMPLETE
Bilirubin, UA: NEGATIVE
Glucose, UA: NEGATIVE
Leukocytes,UA: NEGATIVE
Nitrite, UA: NEGATIVE
Protein,UA: NEGATIVE
Specific Gravity, UA: 1.025 (ref 1.005–1.030)
Urobilinogen, Ur: 2 mg/dL — ABNORMAL HIGH (ref 0.2–1.0)
pH, UA: 6.5 (ref 5.0–7.5)

## 2023-05-03 LAB — MICROSCOPIC EXAMINATION

## 2023-05-21 DIAGNOSIS — M65342 Trigger finger, left ring finger: Secondary | ICD-10-CM | POA: Diagnosis not present

## 2023-05-23 NOTE — Progress Notes (Unsigned)
Established Patient Office Visit  Subjective   Patient ID: Ian Leblanc, male    DOB: May 27, 1961  Age: 62 y.o. MRN: [id number]  No chief complaint on file.   HPI Patient is here for follow up on blood pressure. He was seen in clinic last month for blood pressure.   Hypertension: -Medications: Benazepril increased to 40 mg -Patient is compliant with above medications and reports no side effects. -Checking BP at home (average): doesn't check  -Denies any SOB, CP, vision changes, LE edema or symptoms of hypotension  HLD: -Medications: Lipitor 80 mg -Patient is compliant with above medications and reports no side effects.  -Last lipid panel: Lipid Panel     Component Value Date/Time   CHOL 145 10/06/2022 1204   TRIG 137 10/06/2022 1204   HDL 43 10/06/2022 1204   CHOLHDL 3.4 10/06/2022 1204   VLDL 20 07/05/2016 0843   LDLCALC 78 10/06/2022 1204   The 10-year ASCVD risk score (Arnett DK, et al., 2019) is: 21.7%   Values used to calculate the score:     Age: 68 years     Sex: Male     Is Non-Hispanic African American: No     Diabetic: Yes     Tobacco smoker: No     Systolic Blood Pressure: 128 mmHg     Is BP treated: Yes     HDL Cholesterol: 43 mg/dL     Total Cholesterol: 145 mg/dL  Diabetes, Type 2: -Last A1c 8/24 6.8% -Medications: Metformin 500 mg once daily  -Patient is compliant with the above medications and reports no side effects.  -Checking BG at home: No -Eye exam: UTD April  -Foot exam: UTD 8/24 -Microalbumin: UTD  8/24 -Statin: yes -PNA vaccine: UTD -Denies symptoms of hypoglycemia, polyuria, polydipsia, numbness extremities, foot ulcers/trauma.   Seasonal Allergies: -Currently Flonase, not taking Zyrtec   Health Maintenance: -Blood work UTD -Colonoscopy 07/2018 repeat in 10 years    Review of Systems  Constitutional:  Negative for chills and fever.  Eyes:  Negative for blurred vision.  Respiratory:  Negative for sputum production.    Cardiovascular:  Negative for chest pain.  Gastrointestinal:  Negative for abdominal pain, diarrhea, nausea and vomiting.  Musculoskeletal:  Positive for joint pain.  Neurological:  Negative for headaches.      Objective:     There were no vitals taken for this visit. BP Readings from Last 3 Encounters:  05/02/23 128/86  04/26/23 (!) 144/96  04/12/23 130/82   Wt Readings from Last 3 Encounters:  05/02/23 220 lb (99.8 kg)  04/26/23 224 lb 6.4 oz (101.8 kg)  04/12/23 224 lb 14.4 oz (102 kg)      Physical Exam Constitutional:      Appearance: Normal appearance.  HENT:     Head: Normocephalic and atraumatic.  Eyes:     Conjunctiva/sclera: Conjunctivae normal.  Cardiovascular:     Rate and Rhythm: Normal rate and regular rhythm.     Pulses:          Dorsalis pedis pulses are 2+ on the right side and 2+ on the left side.  Pulmonary:     Effort: Pulmonary effort is normal.     Breath sounds: Normal breath sounds.  Musculoskeletal:     Right foot: Normal range of motion. No deformity, bunion, Charcot foot, foot drop or prominent metatarsal heads.     Left foot: Normal range of motion. No deformity, bunion, Charcot foot, foot drop or prominent metatarsal heads.  Comments: Trigger finger present on fourth left digit  Feet:     Right foot:     Protective Sensation: 6 sites tested.  6 sites sensed.     Skin integrity: Skin integrity normal.     Toenail Condition: Right toenails are normal.     Left foot:     Protective Sensation: 6 sites tested.  6 sites sensed.     Skin integrity: Skin integrity normal.     Toenail Condition: Left toenails are normal.  Skin:    General: Skin is warm and dry.  Neurological:     General: No focal deficit present.     Mental Status: He is alert. Mental status is at baseline.  Psychiatric:        Mood and Affect: Mood normal.        Behavior: Behavior normal.      No results found for any visits on 05/24/23.  Last CBC Lab  Results  Component Value Date   WBC 12.3 (H) 01/05/2023   HGB 13.8 01/05/2023   HCT 40.2 01/05/2023   MCV 87.0 01/05/2023   MCH 29.9 01/05/2023   RDW 12.3 01/05/2023   PLT 180 01/05/2023   Last metabolic panel Lab Results  Component Value Date   GLUCOSE 85 01/05/2023   NA 138 01/05/2023   K 3.9 01/05/2023   CL 103 01/05/2023   CO2 25 01/05/2023   BUN 10 01/05/2023   CREATININE 0.73 01/05/2023   GFRNONAA >60 01/25/2022   CALCIUM 9.2 01/05/2023   PROT 7.1 10/06/2022   ALBUMIN 4.6 07/05/2016   BILITOT 0.7 10/06/2022   ALKPHOS 68 07/05/2016   AST 21 10/06/2022   ALT 45 10/06/2022   ANIONGAP 6 01/25/2022   Last lipids Lab Results  Component Value Date   CHOL 145 10/06/2022   HDL 43 10/06/2022   LDLCALC 78 10/06/2022   TRIG 137 10/06/2022   CHOLHDL 3.4 10/06/2022   Last hemoglobin A1c Lab Results  Component Value Date   HGBA1C 6.8 (A) 04/12/2023   Last thyroid functions No results found for: "TSH", "T3TOTAL", "T4TOTAL", "THYROIDAB" Last vitamin D No results found for: "25OHVITD2", "25OHVITD3", "VD25OH" Last vitamin B12 and Folate No results found for: "VITAMINB12", "FOLATE"    The 10-year ASCVD risk score (Arnett DK, et al., 2019) is: 21.7%    Assessment & Plan:   1. Hypertension, unspecified type: Well controlled, continue Benazepril, refilled.  - benazepril (LOTENSIN) 20 MG tablet; Take 1 tablet (20 mg total) by mouth daily.  Dispense: 90 tablet; Refill: 1  2. Mixed hyperlipidemia: Stable, refill Lipitor 80 mg.  - atorvastatin (LIPITOR) 80 MG tablet; Take 1 tablet (80 mg total) by mouth at bedtime.  Dispense: 90 tablet; Refill: 1  3. Type 2 diabetes mellitus without complication, without long-term current use of insulin (HCC): A1c stable at 6.8% today. Continue Metformin 500 mg daily, refilled. Foot exam and microalbumin today.   - POCT HgB A1C - Urine Microalbumin w/creat. ratio - HM Diabetes Foot Exam - metFORMIN (GLUCOPHAGE) 500 MG tablet; Take 1  tablet (500 mg total) by mouth daily with breakfast.  Dispense: 180 tablet; Refill: 0  4. Sciatica of left side: Printed stretches and exercises to do at home. Will treat with scheduled anti-inflammatories and muscle relaxer as needed.   - naproxen (NAPROSYN) 500 MG tablet; Take 1 tablet (500 mg total) by mouth 2 (two) times daily with a meal for 10 days.  Dispense: 20 tablet; Refill: 0 - tiZANidine (ZANAFLEX) 4 MG  tablet; Take 1 tablet (4 mg total) by mouth at bedtime as needed.  Dispense: 30 tablet; Refill: 0  5. Trigger ring finger of left hand: Referral to Orthopedics placed.   - AMB referral to orthopedics   No follow-ups on file.    Margarita Mail, DO

## 2023-05-24 ENCOUNTER — Ambulatory Visit (INDEPENDENT_AMBULATORY_CARE_PROVIDER_SITE_OTHER): Payer: BC Managed Care – PPO | Admitting: Internal Medicine

## 2023-05-24 ENCOUNTER — Encounter: Payer: Self-pay | Admitting: Internal Medicine

## 2023-05-24 VITALS — BP 140/90 | HR 95 | Temp 98.1°F | Resp 18 | Ht 70.0 in | Wt 226.0 lb

## 2023-05-24 DIAGNOSIS — Z23 Encounter for immunization: Secondary | ICD-10-CM | POA: Diagnosis not present

## 2023-05-24 DIAGNOSIS — I10 Essential (primary) hypertension: Secondary | ICD-10-CM

## 2023-05-24 MED ORDER — BENAZEPRIL HCL 40 MG PO TABS
40.0000 mg | ORAL_TABLET | Freq: Every day | ORAL | 0 refills | Status: DC
Start: 2023-05-24 — End: 2023-08-17

## 2023-05-24 MED ORDER — HYDROCHLOROTHIAZIDE 12.5 MG PO CAPS: 12.5000 mg | ORAL_CAPSULE | Freq: Every day | ORAL | 1 refills | Status: DC

## 2023-05-24 NOTE — Patient Instructions (Signed)
It was great seeing you today!  Plan discussed at today's visit: -Continue Benazepril 40 mg, add hydrochlorothiazide 12.5 mg -Try to increase hydration and cardiovascular exercise, decrease sodium in the diet   Follow up in: 1 month   Take care and let us know if you have any questions or concerns prior to your next visit.  Dr. Caralee Ates

## 2023-05-31 ENCOUNTER — Other Ambulatory Visit: Payer: Self-pay | Admitting: Internal Medicine

## 2023-05-31 DIAGNOSIS — E119 Type 2 diabetes mellitus without complications: Secondary | ICD-10-CM

## 2023-05-31 NOTE — Telephone Encounter (Signed)
Requested Prescriptions  Pending Prescriptions Disp Refills   metFORMIN (GLUCOPHAGE) 500 MG tablet [Pharmacy Med Name: metFORMIN HCl 500 MG Oral Tablet] 90 tablet 1    Sig: Take 1 tablet by mouth once daily with breakfast     Endocrinology:  Diabetes - Biguanides Failed - 05/31/2023  5:38 PM      Failed - B12 Level in normal range and within 720 days    No results found for: "VITAMINB12"       Passed - Cr in normal range and within 360 days    Creat  Date Value Ref Range Status  01/05/2023 0.73 0.70 - 1.35 mg/dL Final   Creatinine, Urine  Date Value Ref Range Status  04/12/2023 122 20 - 320 mg/dL Final         Passed - HBA1C is between 0 and 7.9 and within 180 days    Hgb A1C (fingerstick)  Date Value Ref Range Status  10/05/2016 6.4 (H) <5.7 % Final    Comment:                                                                           According to the ADA Clinical Practice Recommendations for 2011, when HbA1c is used as a screening test:     >=6.5%   Diagnostic of Diabetes Mellitus            (if abnormal result is confirmed)   5.7-6.4%   Increased risk of developing Diabetes Mellitus   References:Diagnosis and Classification of Diabetes Mellitus,Diabetes Care,2011,34(Suppl 1):S62-S69 and Standards of Medical Care in         Diabetes - 2011,Diabetes Care,2011,34 (Suppl 1):S11-S61.      Hemoglobin A1C  Date Value Ref Range Status  04/12/2023 6.8 (A) 4.0 - 5.6 % Final   Hgb A1c MFr Bld  Date Value Ref Range Status  10/06/2022 6.8 (H) <5.7 % of total Hgb Final    Comment:    For someone without known diabetes, a hemoglobin A1c value of 6.5% or greater indicates that they may have  diabetes and this should be confirmed with a follow-up  test. . For someone with known diabetes, a value <7% indicates  that their diabetes is well controlled and a value  greater than or equal to 7% indicates suboptimal  control. A1c targets should be individualized based on  duration  of diabetes, age, comorbid conditions, and  other considerations. . Currently, no consensus exists regarding use of hemoglobin A1c for diagnosis of diabetes for children. .          Passed - eGFR in normal range and within 360 days    GFR, Est African American  Date Value Ref Range Status  05/11/2020 115 > OR = 60 mL/min/1.52m2 Final   GFR, Est Non African American  Date Value Ref Range Status  05/11/2020 100 > OR = 60 mL/min/1.33m2 Final   GFR, Estimated  Date Value Ref Range Status  01/25/2022 >60 >60 mL/min Final    Comment:    (NOTE) Calculated using the CKD-EPI Creatinine Equation (2021)    eGFR  Date Value Ref Range Status  10/06/2022 103 > OR = 60 mL/min/1.19m2 Final  Passed - Valid encounter within last 6 months    Recent Outpatient Visits           1 week ago Hypertension, unspecified type   [facility] Margarita Mail, DO   1 month ago Hypertension, unspecified type   [facility] Danelle Berry, PA-C   1 month ago Type 2 diabetes mellitus without complication, without long-term current use of insulin [facility])   [facility] Margarita Mail, DO   4 months ago Viral upper respiratory tract infection   [facility] Margarita Mail, DO   7 months ago Hypertension, unspecified type   [facility] Margarita Mail, DO       Future Appointments             In 3 weeks Margarita Mail, DO [facility]   In 4 months Margarita Mail, DO [place] [facility]            Passed - CBC within normal limits and completed in the last 12 months    WBC  Date Value Ref Range Status  01/05/2023 12.3 (H) 3.8 - 10.8 Thousand/uL Final   RBC  Date Value Ref Range Status  01/05/2023 4.62 4.20 - 5.80 Million/uL Final   Hemoglobin  Date Value Ref Range Status   01/05/2023 13.8 13.2 - 17.1 g/dL Final   HCT  Date Value Ref Range Status  01/05/2023 40.2 38.5 - 50.0 % Final   MCHC  Date Value Ref Range Status  01/05/2023 34.3 32.0 - 36.0 g/dL Final   [facility]  Date Value Ref Range Status  01/05/2023 29.9 27.0 - 33.0 pg Final   MCV  Date Value Ref Range Status  01/05/2023 87.0 80.0 - 100.0 fL Final   No results found for: "PLTCOUNTKUC", "LABPLAT", "POCPLA" RDW  Date Value Ref Range Status  01/05/2023 12.3 11.0 - 15.0 % Final

## 2023-06-18 NOTE — Progress Notes (Unsigned)
Established Patient Office Visit  Subjective   Patient ID: Ian Leblanc, male    DOB: [birth year]  Age: 62 y.o. MRN: [id number]  No chief complaint on file.   HPI Patient is here for follow up on blood pressure. He was seen in clinic last month for blood pressure and his medication was increased.   Hypertension: -Medications: Benazepril 40 mg, hydrochlorothiazide 12.5 mg  -Patient is compliant with above medications and reports no side effects. -Checking BP at home (average): 140-160/100 -Denies any SOB, CP, vision changes, LE edema or symptoms of hypotension  HLD: -Medications: Lipitor 80 mg -Patient is compliant with above medications and reports no side effects.  -Last lipid panel: Lipid Panel     Component Value Date/Time   CHOL 145 10/06/2022 1204   TRIG 137 10/06/2022 1204   HDL 43 10/06/2022 1204   CHOLHDL 3.4 10/06/2022 1204   VLDL 20 07/05/2016 0843   LDLCALC 78 10/06/2022 1204   The 10-year ASCVD risk score (Arnett DK, et al., 2019) is: 25%   Values used to calculate the score:     Age: 50 years     Sex: Male     Is Non-Hispanic African American: No     Diabetic: Yes     Tobacco smoker: No     Systolic Blood Pressure: 140 mmHg     Is BP treated: Yes     HDL Cholesterol: 43 mg/dL     Total Cholesterol: 145 mg/dL  Diabetes, Type 2: -Last A1c 8/24 6.8% -Medications: Metformin 500 mg once daily  -Patient is compliant with the above medications and reports no side effects.  -Checking BG at home: No -Eye exam: UTD April  -Foot exam: UTD 8/24 -Microalbumin: UTD  8/24 -Statin: yes -PNA vaccine: UTD -Denies symptoms of hypoglycemia, polyuria, polydipsia, numbness extremities, foot ulcers/trauma.   Seasonal Allergies: -Currently Flonase, not taking Zyrtec   Health Maintenance: -Blood work UTD -Colonoscopy 07/2018 repeat in 10 years    Review of Systems  Constitutional:  Negative for chills and fever.  Eyes:  Negative for blurred vision.   Respiratory:  Negative for sputum production.   Cardiovascular:  Negative for chest pain.  Gastrointestinal:  Negative for diarrhea.  Neurological:  Negative for headaches.      Objective:     There were no vitals taken for this visit. BP Readings from Last 3 Encounters:  05/24/23 (!) 140/90  05/02/23 128/86  04/26/23 (!) 144/96   Wt Readings from Last 3 Encounters:  05/24/23 226 lb (102.5 kg)  05/02/23 220 lb (99.8 kg)  04/26/23 224 lb 6.4 oz (101.8 kg)      Physical Exam Constitutional:      Appearance: Normal appearance.  HENT:     Head: Normocephalic and atraumatic.  Eyes:     Conjunctiva/sclera: Conjunctivae normal.  Cardiovascular:     Rate and Rhythm: Normal rate and regular rhythm.  Pulmonary:     Effort: Pulmonary effort is normal.     Breath sounds: Normal breath sounds.  Musculoskeletal:     Right lower leg: No edema.     Left lower leg: No edema.  Skin:    General: Skin is warm and dry.  Neurological:     General: No focal deficit present.     Mental Status: He is alert. Mental status is at baseline.  Psychiatric:        Mood and Affect: Mood normal.        Behavior: Behavior normal.  No results found for any visits on 06/21/23.  Last CBC Lab Results  Component Value Date   WBC 12.3 (H) 01/05/2023   HGB 13.8 01/05/2023   HCT 40.2 01/05/2023   MCV 87.0 01/05/2023   MCH 29.9 01/05/2023   RDW 12.3 01/05/2023   PLT 180 01/05/2023   Last metabolic panel Lab Results  Component Value Date   GLUCOSE 85 01/05/2023   NA 138 01/05/2023   K 3.9 01/05/2023   CL 103 01/05/2023   CO2 25 01/05/2023   BUN 10 01/05/2023   CREATININE 0.73 01/05/2023   GFRNONAA >60 01/25/2022   CALCIUM 9.2 01/05/2023   PROT 7.1 10/06/2022   ALBUMIN 4.6 07/05/2016   BILITOT 0.7 10/06/2022   ALKPHOS 68 07/05/2016   AST 21 10/06/2022   ALT 45 10/06/2022   ANIONGAP 6 01/25/2022   Last lipids Lab Results  Component Value Date   CHOL 145 10/06/2022   HDL  43 10/06/2022   LDLCALC 78 10/06/2022   TRIG 137 10/06/2022   CHOLHDL 3.4 10/06/2022   Last hemoglobin A1c Lab Results  Component Value Date   HGBA1C 6.8 (A) 04/12/2023   Last thyroid functions No results found for: "TSH", "T3TOTAL", "T4TOTAL", "THYROIDAB" Last vitamin D No results found for: "25OHVITD2", "25OHVITD3", "VD25OH" Last vitamin B12 and Folate No results found for: "VITAMINB12", "FOLATE"    The 10-year ASCVD risk score (Arnett DK, et al., 2019) is: 25%    Assessment & Plan:   1. Hypertension, unspecified type: BP still elevated here, continue Benazepril 40 mg, add hydrochlorothiazide 12.5 mg daily. Will continue to monitor BP at home, discussed lifestyle modifications that can also help lower BP. Follow up in 1 month.   - hydrochlorothiazide (MICROZIDE) 12.5 MG capsule; Take 1 capsule (12.5 mg total) by mouth daily.  Dispense: 30 capsule; Refill: 1 - benazepril (LOTENSIN) 40 MG tablet; Take 1 tablet (40 mg total) by mouth daily.  Dispense: 90 tablet; Refill: 0  2. Need for influenza vaccination: Flu vaccine administered today.   - Flu vaccine trivalent PF, 6mos and older(Flulaval,Afluria,Fluarix,Fluzone)   No follow-ups on file.    Margarita Mail, DO

## 2023-06-21 ENCOUNTER — Ambulatory Visit: Payer: BC Managed Care – PPO | Admitting: Internal Medicine

## 2023-06-21 ENCOUNTER — Encounter: Payer: Self-pay | Admitting: Internal Medicine

## 2023-06-21 VITALS — BP 138/88 | HR 78 | Temp 98.0°F | Resp 18 | Ht 70.0 in | Wt 225.0 lb

## 2023-06-21 DIAGNOSIS — I10 Essential (primary) hypertension: Secondary | ICD-10-CM

## 2023-06-21 DIAGNOSIS — E119 Type 2 diabetes mellitus without complications: Secondary | ICD-10-CM

## 2023-06-21 DIAGNOSIS — Z7984 Long term (current) use of oral hypoglycemic drugs: Secondary | ICD-10-CM | POA: Diagnosis not present

## 2023-06-21 MED ORDER — METFORMIN HCL 500 MG PO TABS
500.0000 mg | ORAL_TABLET | Freq: Every day | ORAL | 1 refills | Status: DC
Start: 2023-06-21 — End: 2023-09-21

## 2023-06-21 MED ORDER — HYDROCHLOROTHIAZIDE 25 MG PO TABS
25.0000 mg | ORAL_TABLET | Freq: Every day | ORAL | 1 refills | Status: DC
Start: 2023-06-21 — End: 2023-08-17

## 2023-06-21 MED ORDER — OZEMPIC (0.25 OR 0.5 MG/DOSE) 2 MG/3ML ~~LOC~~ SOPN
0.2500 mg | PEN_INJECTOR | SUBCUTANEOUS | 0 refills | Status: DC
Start: 2023-06-21 — End: 2023-07-16

## 2023-06-21 NOTE — Patient Instructions (Signed)
It was great seeing you today!  Plan discussed at today's visit: -For blood pressure, continue Benazepril 40 mg, increase hydrochlorothiazide to 25 mg daily -For diabetes, continue Metformin 500 mg once daily, start Ozempic 0.25 mg once weekly   Follow up in: 1 month   Take care and let us know if you have any questions or concerns prior to your next visit.  Dr. Caralee Ates  Semaglutide Injection What is this medication? SEMAGLUTIDE (SEM a GLOO tide) treats type 2 diabetes. It works by increasing insulin levels in your body, which decreases your blood sugar (glucose).   It also reduces the amount of sugar released into your blood and slows down your digestion. It may also be used to lower the risk of heart attack and stroke in people with type 2 diabetes. Changes to diet and exercise are often combined with this medication. This medicine may be used for other purposes; ask your health care provider or pharmacist if you have questions. COMMON BRAND NAME(S): OZEMPIC What should I tell my care team before I take this medication? They need to know if you have any of these conditions: Endocrine tumors (MEN 2) or if someone in your family had these tumors Eye disease, vision problems History of pancreatitis Kidney disease Stomach problems Thyroid cancer or if someone in your family had thyroid cancer An unusual or allergic reaction to semaglutide, other medications, foods, dyes, or preservatives Pregnant or trying to get pregnant Breast-feeding How should I use this medication? This medication is for injection under the skin of your upper leg (thigh), stomach area, or upper arm. It is given once every week (every 7 days). You will be taught how to prepare and give this medication. Use exactly as directed. Take your medication at regular intervals. Do not take it more often than directed. If you use this medication with insulin, you should inject this medication and the insulin separately. Do not  mix them together. Do not give the injections right next to each other. Change (rotate) injection sites with each injection. It is important that you put your used needles and syringes in a special sharps container. Do not put them in a trash can. If you do not have a sharps container, call your pharmacist or care team to get one. A special MedGuide will be given to you by the pharmacist with each prescription and refill. Be sure to read this information carefully each time. This medication comes with INSTRUCTIONS FOR USE. Ask your pharmacist for directions on how to use this medication. Read the information carefully. Talk to your pharmacist or care team if you have questions. Talk to your care team about the use of this medication in children. Special care may be needed. Overdosage: If you think you have taken too much of this medicine contact a poison control center or emergency room at once. NOTE: This medicine is only for you. Do not share this medicine with others. What if I miss a dose? If you miss a dose, take it as soon as you can within 5 days after the missed dose. Then take your next dose at your regular weekly time. If it has been longer than 5 days after the missed dose, do not take the missed dose. Take the next dose at your regular time. Do not take double or extra doses. If you have questions about a missed dose, contact your care team for advice. What may interact with this medication? Other medications for diabetes Many medications may cause  changes in blood sugar, these include: Alcohol containing beverages Antiviral medications for HIV or AIDS Aspirin and aspirin-like medications Certain medications for blood pressure, heart disease, irregular heart beat Chromium Diuretics Male hormones, such as estrogens or progestins, birth control pills Fenofibrate Gemfibrozil Isoniazid Lanreotide Male hormones or anabolic steroids MAOIs like Carbex, Eldepryl, Marplan, Nardil, and  Parnate Medications for weight loss Medications for allergies, asthma, cold, or cough Medications for depression, anxiety, or psychotic disturbances Niacin Nicotine NSAIDs, medications for pain and inflammation, like ibuprofen or naproxen Octreotide Pasireotide Pentamidine Phenytoin Probenecid Quinolone antibiotics such as ciprofloxacin, levofloxacin, ofloxacin Some herbal dietary supplements Steroid medications such as prednisone or cortisone Sulfamethoxazole; trimethoprim Thyroid hormones Some medications can hide the warning symptoms of low blood sugar (hypoglycemia). You may need to monitor your blood sugar more closely if you are taking one of these medications. These include: Beta-blockers, often used for high blood pressure or heart problems (examples include atenolol, metoprolol, propranolol) Clonidine Guanethidine Reserpine This list may not describe all possible interactions. Give your health care provider a list of all the medicines, herbs, non-prescription drugs, or dietary supplements you use. Also tell them if you smoke, drink alcohol, or use illegal drugs. Some items may interact with your medicine. What should I watch for while using this medication? Visit your care team for regular checks on your progress. Drink plenty of fluids while taking this medication. Check with your care team if you get an attack of severe diarrhea, nausea, and vomiting. The loss of too much body fluid can make it dangerous for you to take this medication. A test called the HbA1C (A1C) will be monitored. This is a simple blood test. It measures your blood sugar control over the last 2 to 3 months. You will receive this test every 3 to 6 months. Learn how to check your blood sugar. Learn the symptoms of low and high blood sugar and how to manage them. Always carry a quick-source of sugar with you in case you have symptoms of low blood sugar. Examples include hard sugar candy or glucose tablets.  Make sure others know that you can choke if you eat or drink when you develop serious symptoms of low blood sugar, such as seizures or unconsciousness. They must get medical help at once. Tell your care team if you have high blood sugar. You might need to change the dose of your medication. If you are sick or exercising more than usual, you might need to change the dose of your medication. Do not skip meals. Ask your care team if you should avoid alcohol. Many nonprescription cough and cold products contain sugar or alcohol. These can affect blood sugar. Pens should never be shared. Even if the needle is changed, sharing may result in passing of viruses like hepatitis or HIV. Wear a medical ID bracelet or chain, and carry a card that describes your disease and details of your medication and dosage times. What side effects may I notice from receiving this medication? Side effects that you should report to your care team as soon as possible: Allergic reactions--skin rash, itching, hives, swelling of the face, lips, tongue, or throat Change in vision Dehydration--increased thirst, dry mouth, feeling faint or lightheaded, headache, dark yellow or brown urine Gallbladder problems--severe stomach pain, nausea, vomiting, fever Heart palpitations--rapid, pounding, or irregular heartbeat Kidney injury--decrease in the amount of urine, swelling of the ankles, hands, or feet Pancreatitis--severe stomach pain that spreads to your back or gets worse after eating or when touched,  fever, nausea, vomiting Thoughts of suicide or self-harm, worsening mood, feelings of depression Thyroid cancer--new mass or lump in the neck, pain or trouble swallowing, trouble breathing, hoarseness Side effects that usually do not require medical attention (report these to your care team if they continue or are bothersome): Diarrhea Loss of appetite Nausea Upset stomach This list may not describe all possible side effects. Call  your doctor for medical advice about side effects. You may report side effects to FDA at 1-800-FDA-1088. Where should I keep my medication? Keep out of the reach of children. Store unopened pens in a refrigerator between 2 and 8 degrees C (36 and 46 degrees F). Do not freeze. Protect from light and heat. After you first use the pen, it can be stored for 56 days at room temperature between 15 and 30 degrees C (59 and 86 degrees F) or in a refrigerator. Throw away your used pen after 56 days or after the expiration date, whichever comes first. Do not store your pen with the needle attached. If the needle is left on, medication may leak from the pen. NOTE: This sheet is a summary. It may not cover all possible information. If you have questions about this medicine, talk to your doctor, pharmacist, or health care provider.  2024 Elsevier/Gold Standard (2022-11-13 00:00:00)

## 2023-07-16 ENCOUNTER — Ambulatory Visit (INDEPENDENT_AMBULATORY_CARE_PROVIDER_SITE_OTHER): Payer: BC Managed Care – PPO | Admitting: Nurse Practitioner

## 2023-07-16 ENCOUNTER — Other Ambulatory Visit: Payer: Self-pay

## 2023-07-16 ENCOUNTER — Encounter: Payer: Self-pay | Admitting: Nurse Practitioner

## 2023-07-16 VITALS — BP 142/88 | HR 96 | Temp 98.2°F | Resp 16 | Ht 70.0 in | Wt 226.5 lb

## 2023-07-16 DIAGNOSIS — I10 Essential (primary) hypertension: Secondary | ICD-10-CM

## 2023-07-16 DIAGNOSIS — E1159 Type 2 diabetes mellitus with other circulatory complications: Secondary | ICD-10-CM

## 2023-07-16 DIAGNOSIS — Z7984 Long term (current) use of oral hypoglycemic drugs: Secondary | ICD-10-CM | POA: Diagnosis not present

## 2023-07-16 DIAGNOSIS — E119 Type 2 diabetes mellitus without complications: Secondary | ICD-10-CM

## 2023-07-16 LAB — POCT GLYCOSYLATED HEMOGLOBIN (HGB A1C): Hemoglobin A1C: 7.8 % — AB (ref 4.0–5.6)

## 2023-07-16 MED ORDER — OZEMPIC (0.25 OR 0.5 MG/DOSE) 2 MG/3ML ~~LOC~~ SOPN: 0.5000 mg | PEN_INJECTOR | SUBCUTANEOUS | 0 refills | Status: DC

## 2023-07-16 NOTE — Progress Notes (Signed)
BP (!) 142/88   Pulse 96   Temp 98.2 F (36.8 C) (Oral)   Resp 16   Ht 5\' 10"  (1.778 m)   Wt 226 lb 8 oz (102.7 kg)   SpO2 95%   BMI 32.50 kg/m    Subjective:    Patient ID: Ian Leblanc, male    DOB: 12/02/1960, 62 y.o.   MRN: [id number]  HPI: Ian Leblanc is a 62 y.o. male  No chief complaint on file.  Discussed the use of AI scribe software for clinical note transcription with the patient, who gave verbal consent to proceed.  History of Present Illness   The patient, with a history of hypertension and type 2 diabetes, presents for follow-up after a recent visit with Dr. Caralee Ates. At that visit, his blood pressure was uncontrolled, so his hydrochlorothiazide was increased to 25mg  daily and benazepril 40mg  daily was continued. He was also started on Ozempic 0.25mg  weekly for his diabetes, and he continues to take metformin 500mg  daily. Despite these changes, his blood pressure remains elevated with a reading of 142/88 today. At home, his blood pressure has been around 152/88, which he reports is a significant improvement from previous readings of 190/110. Discussed that his blood pressure is not quite at goal.  recommend continue to take medications as prescribed and watch sodium intake.   The patient had some initial difficulty administering the Ozempic due to misunderstanding the dosing instructions, resulting in wasted doses. However, he reports that he has since figured out the correct dosing and has successfully administered four doses. He tolerated the medication well and requests a refill.  The patient also has a history of cancer and underwent treatment, which he believes has contributed to his current blood pressure and blood sugar control issues.    Vitals:   07/16/23 1511  BP: (!) 142/88  Pulse: 96  Resp: 16  Temp: 98.2 F (36.8 C)  SpO2: 95%    Relevant past medical, surgical, family and social history reviewed and updated as indicated. Interim medical history  since our last visit reviewed. Allergies and medications reviewed and updated.  Review of Systems  Constitutional: Negative for fever or weight change.  Respiratory: Negative for cough and shortness of breath.   Cardiovascular: Negative for chest pain or palpitations.  Gastrointestinal: Negative for abdominal pain, no bowel changes.  Musculoskeletal: Negative for gait problem or joint swelling.  Skin: Negative for rash.  Neurological: Negative for dizziness or headache.  No other specific complaints in a complete review of systems (except as listed in HPI above).      Objective:    BP (!) 142/88   Pulse 96   Temp 98.2 F (36.8 C) (Oral)   Resp 16   Ht 5\' 10"  (1.778 m)   Wt 226 lb 8 oz (102.7 kg)   SpO2 95%   BMI 32.50 kg/m   Wt Readings from Last 3 Encounters:  07/16/23 226 lb 8 oz (102.7 kg)  06/21/23 225 lb (102.1 kg)  05/24/23 226 lb (102.5 kg)    Physical Exam  Constitutional: Patient appears well-developed and well-nourished. Obese  No distress.  HEENT: head atraumatic, normocephalic, pupils equal and reactive to light, neck supple Cardiovascular: Normal rate, regular rhythm and normal heart sounds.  No murmur heard. No BLE edema. Pulmonary/Chest: Effort normal and breath sounds normal. No respiratory distress. Abdominal: Soft.  There is no tenderness. Psychiatric: Patient has a normal mood and affect. behavior is normal. Judgment and thought content normal.  Results for orders placed or performed in visit on 07/16/23  POCT HgB A1C  Result Value Ref Range   Hemoglobin A1C 7.8 (A) 4.0 - 5.6 %   HbA1c POC (<> result, manual entry)     HbA1c, POC (prediabetic range)     HbA1c, POC (controlled diabetic range)        Assessment & Plan:   Problem List Items Addressed This Visit       Cardiovascular and Mediastinum   HTN (hypertension) (Chronic)     Endocrine   Diabetes mellitus type 2, uncomplicated (HCC) - Primary   Relevant Medications   Semaglutide,0.25  or 0.5MG /DOS, (OZEMPIC, 0.25 OR 0.5 MG/DOSE,) 2 MG/3ML SOPN   Other Relevant Orders   POCT HgB A1C (Completed)    Assessment and Plan    Hypertension Despite increase in Hydrochlorothiazide to 25mg  daily and continued Benazepril 40mg  daily, blood pressure remains uncontrolled with home readings around 152/88-89 and today's reading of 142/88. -Continue current regimen and monitor blood pressure at home. -follow a DASH diet  Type 2 Diabetes Mellitus A1c increased from 6.8 to 7.8. Recently started on Ozempic 0.25mg  weekly in addition to Metformin 500mg  daily. Initial difficulties with Ozempic administration, but now resolved. -Continue current regimen. -Check A1c in 3 months. -Increase Ozempic to 0.5mg  weekly.  Medication Management Difficulty with medication administration and adherence. -Refill Ozempic prescription.        A1C is 7.8 today  Follow up plan: Return in about 3 months (around 10/16/2023) for follow up.

## 2023-08-01 ENCOUNTER — Ambulatory Visit: Payer: BC Managed Care – PPO | Admitting: Urology

## 2023-08-01 VITALS — BP 141/95 | HR 88

## 2023-08-01 DIAGNOSIS — B349 Viral infection, unspecified: Secondary | ICD-10-CM | POA: Diagnosis not present

## 2023-08-01 DIAGNOSIS — Z8551 Personal history of malignant neoplasm of bladder: Secondary | ICD-10-CM

## 2023-08-01 DIAGNOSIS — D494 Neoplasm of unspecified behavior of bladder: Secondary | ICD-10-CM | POA: Diagnosis not present

## 2023-08-01 DIAGNOSIS — R82998 Other abnormal findings in urine: Secondary | ICD-10-CM | POA: Diagnosis not present

## 2023-08-01 DIAGNOSIS — C679 Malignant neoplasm of bladder, unspecified: Secondary | ICD-10-CM | POA: Diagnosis not present

## 2023-08-01 LAB — MICROSCOPIC EXAMINATION

## 2023-08-01 LAB — URINALYSIS, COMPLETE
Bilirubin, UA: NEGATIVE
Glucose, UA: NEGATIVE
Ketones, UA: NEGATIVE
Leukocytes,UA: NEGATIVE
Nitrite, UA: NEGATIVE
Protein,UA: NEGATIVE
RBC, UA: NEGATIVE
Specific Gravity, UA: 1.02 (ref 1.005–1.030)
Urobilinogen, Ur: 0.2 mg/dL (ref 0.2–1.0)
pH, UA: 6 (ref 5.0–7.5)

## 2023-08-01 NOTE — Progress Notes (Signed)
   08/01/23  CC: cysto   HPI: Ian Leblanc is a 62 y.o. male with a personal history of kidney stones, gross hematuria with negative workup, bladder cancer, who presents today for 3 month surveillance cystoscopy.   CT urogram on 12/02/2020 showed 1 small area within the bladder, several millimeters in size near the dome which may recheck present filling defect. Otherwise there were no additional upper tract pathology identified. Small incidental pulmonary nodule felt to be likely benign.    He underwent a transurethral resection of bladder tumor with Gemcitabine for malignant neoplasm of lateral wall of urinary bladder on 01/10/2021.    Surgical pathology revealed urinary bladder; transurethral resection: non invasive papillary urothelial carcinoma, low-grade. No muscularis propria identified.   He returned to the operating room in June 2023 with an 8 mm tumor which appeared to be a small satellite recurrence at the edge of the TUR resection bed as well as a 3 mm at the left trigone.  Surgical pathology was consistent with low-grade TA disease.  He did receive postprocedural gentamicin.  He returned to the operating room on 12/2022 with another very small recurrence of the left lateral bladder wall retrograde pyelograms were normal.  Surgical pathology was consistent with low-grade noninvasive bladder cancer as well as an additional tumor with high-grade noninvasive bladder cancer as well as CIS in the bladder.  He completed induction BCG in 03/2023.  NED. A&Ox3.   No respiratory distress   Abd soft, NT, ND Normal phallus with bilateral descended testicles  Cystoscopy Procedure Note  Patient identification was confirmed, informed consent was obtained, and patient was prepped using Betadine solution.  Lidocaine jelly was administered per urethral meatus.     Pre-Procedure: - Inspection reveals a normal caliber ureteral meatus.  Procedure: The flexible cystoscope was introduced without  difficulty - No urethral strictures/lesions are present. - Normal prostate  - Slightly Elevated bladder neck - Bilateral ureteral orifices identified - Bladder mucosa reveals a stellate scar at the left lateral bladder wall, at the 5 o'clock position  - No bladder stones - No trabeculation - stellate scare on left lateral bladder neck area   Retroflexion shows stellate scar near bladder neck as well as small intravesical component of the prostate   Post-Procedure: - Patient tolerated the procedure well   Assessment/ Plan:  History of bladder cancer  -Intermediate risk given recurrence s/p induction BCG -NED today -Urine cytology sent today -Will follow closely with cysto in 3 mo and urine cytology   Vanna Scotland, MD

## 2023-08-09 ENCOUNTER — Other Ambulatory Visit: Payer: Self-pay | Admitting: Nurse Practitioner

## 2023-08-09 DIAGNOSIS — E119 Type 2 diabetes mellitus without complications: Secondary | ICD-10-CM

## 2023-08-09 NOTE — Telephone Encounter (Signed)
Requested Prescriptions  Pending Prescriptions Disp Refills   OZEMPIC, 0.25 OR 0.5 MG/DOSE, 2 MG/3ML SOPN [Pharmacy Med Name: Ozempic (0.25 or 0.5 MG/DOSE) 2 MG/3ML Subcutaneous Solution Pen-injector] 3 mL 0    Sig: INJECT 0.5 MG INTO THE SKIN ONCE A WEEK     Endocrinology:  Diabetes - GLP-1 Receptor Agonists - semaglutide Failed - 08/09/2023  2:55 PM      Failed - HBA1C in normal range and within 180 days    Hgb A1C (fingerstick)  Date Value Ref Range Status  10/05/2016 6.4 (H) <5.7 % Final    Comment:                                                                           According to the ADA Clinical Practice Recommendations for 2011, when HbA1c is used as a screening test:     >=6.5%   Diagnostic of Diabetes Mellitus            (if abnormal result is confirmed)   5.7-6.4%   Increased risk of developing Diabetes Mellitus   References:Diagnosis and Classification of Diabetes Mellitus,Diabetes Care,2011,34(Suppl 1):S62-S69 and Standards of Medical Care in         Diabetes - 2011,Diabetes Care,2011,34 (Suppl 1):S11-S61.      Hemoglobin A1C  Date Value Ref Range Status  07/16/2023 7.8 (A) 4.0 - 5.6 % Final   Hgb A1c MFr Bld  Date Value Ref Range Status  10/06/2022 6.8 (H) <5.7 % of total Hgb Final    Comment:    For someone without known diabetes, a hemoglobin A1c value of 6.5% or greater indicates that they may have  diabetes and this should be confirmed with a follow-up  test. . For someone with known diabetes, a value <7% indicates  that their diabetes is well controlled and a value  greater than or equal to 7% indicates suboptimal  control. A1c targets should be individualized based on  duration of diabetes, age, comorbid conditions, and  other considerations. . Currently, no consensus exists regarding use of hemoglobin A1c for diagnosis of diabetes for children. .          Passed - Cr in normal range and within 360 days    Creat  Date Value Ref Range  Status  01/05/2023 0.73 0.70 - 1.35 mg/dL Final   Creatinine, Urine  Date Value Ref Range Status  04/12/2023 122 20 - 320 mg/dL Final         Passed - Valid encounter within last 6 months    Recent Outpatient Visits           3 weeks ago Type 2 diabetes mellitus without complication, without long-term current use of insulin [facility])   [facility] Berniece Salines, FNP   1 month ago Hypertension, unspecified type   [facility] Margarita Mail, DO   2 months ago Hypertension, unspecified type   [facility] Margarita Mail, DO   3 months ago Hypertension, unspecified type   [facility] Danelle Berry, PA-C   3 months ago Type 2 diabetes mellitus without complication, without long-term current use of insulin (HCC)   [place]  [facility] Margarita Mail, DO       Future Appointments             In 1 week Margarita Mail, DO [facility], [facility]   In 2 months Margarita Mail, DO [facility], [facility]

## 2023-08-15 ENCOUNTER — Other Ambulatory Visit: Payer: Self-pay | Admitting: Internal Medicine

## 2023-08-15 DIAGNOSIS — I10 Essential (primary) hypertension: Secondary | ICD-10-CM

## 2023-08-16 NOTE — Telephone Encounter (Signed)
Requested medication (s) are due for refill today: Due 08/21/23  Requested medication (s) are on the active medication list: yes    Last refill: 06/21/23   #30  1 refills  Future visit scheduled Yes  Notes to clinic: Pt has appt tomorrow. Failed due to labs, please review. Thank you.  Requested Prescriptions  Pending Prescriptions Disp Refills   hydrochlorothiazide (HYDRODIURIL) 25 MG tablet [Pharmacy Med Name: hydroCHLOROthiazide 25 MG Oral Tablet] 30 tablet 0    Sig: Take 1 tablet by mouth once daily     Cardiovascular: Diuretics - Thiazide Failed - 08/16/2023  5:41 PM      Failed - Cr in normal range and within 180 days    Creat  Date Value Ref Range Status  01/05/2023 0.73 0.70 - 1.35 mg/dL Final   Creatinine, Urine  Date Value Ref Range Status  04/12/2023 122 20 - 320 mg/dL Final         Failed - K in normal range and within 180 days    Potassium  Date Value Ref Range Status  01/05/2023 3.9 3.5 - 5.3 mmol/L Final         Failed - Na in normal range and within 180 days    Sodium  Date Value Ref Range Status  01/05/2023 138 135 - 146 mmol/L Final         Failed - Last BP in normal range    BP Readings from Last 1 Encounters:  08/01/23 (!) 141/95         Passed - Valid encounter within last 6 months    Recent Outpatient Visits           1 month ago Type 2 diabetes mellitus without complication, without long-term current use of insulin (HCC)   [facility] Berniece Salines, FNP   1 month ago Hypertension, unspecified type   [facility] Margarita Mail, DO   2 months ago Hypertension, unspecified type   [facility] Margarita Mail, DO   3 months ago Hypertension, unspecified type   [facility] Danelle Berry, PA-C   4 months ago Type 2 diabetes mellitus without complication, without long-term current use of insulin [facility])   [facility] Margarita Mail, DO       Future Appointments             Tomorrow Margarita Mail, DO [place] [facility]   In 2 months Margarita Mail, DO [facility], [facility]

## 2023-08-17 ENCOUNTER — Encounter: Payer: Self-pay | Admitting: Internal Medicine

## 2023-08-17 ENCOUNTER — Ambulatory Visit (INDEPENDENT_AMBULATORY_CARE_PROVIDER_SITE_OTHER): Payer: BC Managed Care – PPO | Admitting: Internal Medicine

## 2023-08-17 VITALS — BP 138/76 | HR 101 | Resp 16 | Ht 70.0 in

## 2023-08-17 DIAGNOSIS — I10 Essential (primary) hypertension: Secondary | ICD-10-CM | POA: Diagnosis not present

## 2023-08-17 DIAGNOSIS — E1169 Type 2 diabetes mellitus with other specified complication: Secondary | ICD-10-CM

## 2023-08-17 DIAGNOSIS — Z7984 Long term (current) use of oral hypoglycemic drugs: Secondary | ICD-10-CM | POA: Diagnosis not present

## 2023-08-17 DIAGNOSIS — E119 Type 2 diabetes mellitus without complications: Secondary | ICD-10-CM

## 2023-08-17 MED ORDER — BENAZEPRIL HCL 40 MG PO TABS: 40.0000 mg | ORAL_TABLET | Freq: Every day | ORAL | 1 refills | Status: DC

## 2023-08-17 MED ORDER — HYDROCHLOROTHIAZIDE 25 MG PO TABS: 25.0000 mg | ORAL_TABLET | Freq: Every day | ORAL | 1 refills | Status: DC

## 2023-08-17 MED ORDER — SEMAGLUTIDE (1 MG/DOSE) 4 MG/3ML ~~LOC~~ SOPN: 1.0000 mg | PEN_INJECTOR | SUBCUTANEOUS | 1 refills | Status: DC

## 2023-08-17 NOTE — Assessment & Plan Note (Signed)
 Blood pressure stable here today, no changes made to medications and appropriate refills sent to pharmacy.

## 2023-08-17 NOTE — Progress Notes (Signed)
Established Patient Office Visit  Subjective   Patient ID: Ian Leblanc, male    DOB: October 07, 1960  Age: 62 y.o. MRN: [id number]  Chief Complaint  Patient presents with   Hypertension   Diabetes    HPI  Patient is here for follow up on chronic medical conditions.   Hypertension: -Medications: Benazepril 40 mg, hydrochlorothiazide increased to 25 mg  -Patient is compliant with above medications and reports no side effects. -Checking BP at home (average): 140-145/90-95 -Denies any SOB, CP, vision changes, LE edema or symptoms of hypotension  HLD: -Medications: Lipitor 80 mg -Patient is compliant with above medications and reports no side effects.  -Last lipid panel: Lipid Panel     Component Value Date/Time   CHOL 145 10/06/2022 1204   TRIG 137 10/06/2022 1204   HDL 43 10/06/2022 1204   CHOLHDL 3.4 10/06/2022 1204   VLDL 20 07/05/2016 0843   LDLCALC 78 10/06/2022 1204   The 10-year ASCVD risk score (Arnett DK, et al., 2019) is: 24.4%   Values used to calculate the score:     Age: 59 years     Sex: Male     Is Non-Hispanic African American: No     Diabetic: Yes     Tobacco smoker: No     Systolic Blood Pressure: 138 mmHg     Is BP treated: Yes     HDL Cholesterol: 43 mg/dL     Total Cholesterol: 145 mg/dL   Diabetes, Type 2: -Last A1c 11/24 7.8% -Medications: Metformin 500 mg once daily, Ozempic 0.5 mg (increased at LOV) -Patient is compliant with the above medications and reports no side effects.  -Checking BG at home: No -Eye exam: Due - needs a new referral because his eye doctor moved  -Foot exam: UTD 8/24 -Microalbumin: UTD  8/24 -Statin: yes -PNA vaccine: UTD -Denies symptoms of hypoglycemia, polyuria, polydipsia, numbness extremities, foot ulcers/trauma.   Seasonal Allergies: -Currently Flonase, not taking Zyrtec   Health Maintenance: -Blood work UTD -Colonoscopy 07/2018 repeat in 10 years   Patient Active Problem List   Diagnosis Date Noted    Malignant neoplasm of bladder (HCC) 12/07/2022   Chronic rhinitis 08/27/2020   Controlled type 2 diabetes mellitus with hyperglycemia, without long-term current use of insulin (HCC) 07/04/2019   Allergic rhinitis 07/03/2019   Cervicalgia 02/04/2015   Diabetes mellitus type 2, uncomplicated (HCC) 06/08/2014   HTN (hypertension) 10/10/2012   HLD (hyperlipidemia) 10/10/2012   History of colonic polyps 10/10/2012   Past Medical History:  Diagnosis Date   Abnormal EKG 07/16/2013   Arthritis    Cancer (HCC)    bladder   Cervical spine disease 02/04/2015   Chest pain 07/16/2013   Diabetes mellitus without complication (HCC)    History of kidney stones    Hyperglycemia    Hyperlipidemia    Hypertension    Neck pain on left side 02/04/2015   Numbness of fingers 02/04/2015   Paresthesia of left arm 02/04/2015   Screening for prostate cancer 12/09/2014   Sessile colonic polyp    Past Surgical History:  Procedure Laterality Date   BACK SURGERY     cyst removal. no metal in his back   BLADDER INSTILLATION N/A 01/26/2022   Procedure: BLADDER INSTILLATION OF GEMCITABINE;  Surgeon: Vanna Scotland, MD;  Location: [facility];  Service: Urology;  Laterality: N/A;   BLADDER INSTILLATION N/A 12/25/2022   Procedure: BLADDER INSTILLATION OF GEMCITABINE;  Surgeon: Vanna Scotland, MD;  Location: [facility];  Service: Urology;  Laterality: N/A;   CYSTOSCOPY W/ RETROGRADES Bilateral 01/26/2022   Procedure: CYSTOSCOPY WITH RETROGRADE PYELOGRAM;  Surgeon: Vanna Scotland, MD;  Location: [facility];  Service: Urology;  Laterality: Bilateral;   CYSTOSCOPY W/ RETROGRADES Bilateral 12/25/2022   Procedure: CYSTOSCOPY WITH RETROGRADE PYELOGRAM;  Surgeon: Vanna Scotland, MD;  Location: [facility];  Service: Urology;  Laterality: Bilateral;   TRANSURETHRAL RESECTION OF BLADDER TUMOR N/A 01/10/2021   Procedure: TRANSURETHRAL RESECTION OF BLADDER TUMOR (TURBT) WITH GEMCITABINE;  Surgeon: Vanna Scotland, MD;  Location:  [facility];  Service: Urology;  Laterality: N/A;   TRANSURETHRAL RESECTION OF BLADDER TUMOR N/A 01/26/2022   Procedure: TRANSURETHRAL RESECTION OF BLADDER TUMOR (TURBT);  Surgeon: Vanna Scotland, MD;  Location: [facility];  Service: Urology;  Laterality: N/A;   TRANSURETHRAL RESECTION OF BLADDER TUMOR N/A 12/25/2022   Procedure: TRANSURETHRAL RESECTION OF BLADDER TUMOR (TURBT);  Surgeon: Vanna Scotland, MD;  Location: [facility];  Service: Urology;  Laterality: N/A;   Social History   Tobacco Use   Smoking status: Former    Current packs/day: 0.00    Types: Cigarettes    Quit date: 07/17/2003    Years since quitting: 20.0    Passive exposure: Past   Smokeless tobacco: Never  Vaping Use   Vaping status: Never Used  Substance Use Topics   Alcohol use: Yes    Comment: wine occ   Drug use: No   Social History   Socioeconomic History   Marital status: Married    Spouse name: Aggie Cosier   Number of children: Not on file   Years of education: Not on file   Highest education level: 12th grade  Occupational History   Occupation: Transport planner, Physiological scientist  Tobacco Use   Smoking status: Former    Current packs/day: 0.00    Types: Cigarettes    Quit date: 07/17/2003    Years since quitting: 20.0    Passive exposure: Past   Smokeless tobacco: Never  Vaping Use   Vaping status: Never Used  Substance and Sexual Activity   Alcohol use: Yes    Comment: wine occ   Drug use: No   Sexual activity: Yes    Birth control/protection: None  Other Topics Concern   Not on file  Social History Narrative   Patient lives with wife only. Feels safe in his home.    Social Drivers of Corporate investment banker Strain: Low Risk  (08/15/2023)   Overall Financial Resource Strain (CARDIA)    Difficulty of Paying Living Expenses: Not hard at all  Food Insecurity: No Food Insecurity (08/15/2023)   Hunger Vital Sign    Worried About Running Out of Food in the Last Year: Never true    Ran Out  of Food in the Last Year: Never true  Recent Concern: Food Insecurity - Food Insecurity Present (06/18/2023)   Hunger Vital Sign    Worried About Running Out of Food in the Last Year: Sometimes true    Ran Out of Food in the Last Year: Never true  Transportation Needs: No Transportation Needs (08/15/2023)   PRAPARE - Administrator, Civil Service (Medical): No    Lack of Transportation (Non-Medical): No  Physical Activity: Insufficiently Active (08/15/2023)   Exercise Vital Sign    Days of Exercise per Week: 3 days    Minutes of Exercise per Session: 30 min  Stress: Stress Concern Present (08/15/2023)   [employer] of Occupational Health - Occupational Stress Questionnaire    Feeling  of Stress : To some extent  Social Connections: Moderately Isolated (08/15/2023)   Social Connection and Isolation Panel [NHANES]    Frequency of Communication with Friends and Family: Three times a week    Frequency of Social Gatherings with Friends and Family: Twice a week    Attends Religious Services: Never    Database administrator or Organizations: No    Attends Engineer, structural: Not on file    Marital Status: Married  Catering manager Violence: Not At Risk (12/07/2022)   Humiliation, Afraid, Rape, and Kick questionnaire    Fear of Current or Ex-Partner: No    Emotionally Abused: No    Physically Abused: No    Sexually Abused: No   Family Status  Relation Name Status   Mother  Alive   Father  Deceased       pneumonia   Sister  Alive   Brother  Alive   PGF  Deceased  No partnership data on file   Family History  Problem Relation Age of Onset   Prostate cancer Paternal Grandfather    Allergies  Allergen Reactions   Crestor [Rosuvastatin] Other (See Comments)    Severe myalgias      Review of Systems  Gastrointestinal:  Negative for abdominal pain, constipation, heartburn, nausea and vomiting.  All other systems reviewed and are negative.      Objective:     BP 138/76   Pulse (!) 101   Resp 16   Ht 5\' 10"  (1.778 m)   SpO2 98%   BMI 32.50 kg/m  BP Readings from Last 3 Encounters:  08/17/23 138/76  08/01/23 (!) 141/95  07/16/23 (!) 142/88   Wt Readings from Last 3 Encounters:  07/16/23 226 lb 8 oz (102.7 kg)  06/21/23 225 lb (102.1 kg)  05/24/23 226 lb (102.5 kg)      Physical Exam Constitutional:      Appearance: Normal appearance.  HENT:     Head: Normocephalic and atraumatic.  Eyes:     Conjunctiva/sclera: Conjunctivae normal.  Cardiovascular:     Rate and Rhythm: Normal rate and regular rhythm.  Pulmonary:     Effort: Pulmonary effort is normal.     Breath sounds: Normal breath sounds.  Skin:    General: Skin is warm and dry.  Neurological:     General: No focal deficit present.     Mental Status: He is alert. Mental status is at baseline.  Psychiatric:        Mood and Affect: Mood normal.        Behavior: Behavior normal.      No results found for any visits on 08/17/23.  Last CBC Lab Results  Component Value Date   WBC 12.3 (H) 01/05/2023   HGB 13.8 01/05/2023   HCT 40.2 01/05/2023   MCV 87.0 01/05/2023   MCH 29.9 01/05/2023   RDW 12.3 01/05/2023   PLT 180 01/05/2023   Last metabolic panel Lab Results  Component Value Date   GLUCOSE 85 01/05/2023   NA 138 01/05/2023   K 3.9 01/05/2023   CL 103 01/05/2023   CO2 25 01/05/2023   BUN 10 01/05/2023   CREATININE 0.73 01/05/2023   EGFR 103 10/06/2022   CALCIUM 9.2 01/05/2023   PROT 7.1 10/06/2022   ALBUMIN 4.6 07/05/2016   BILITOT 0.7 10/06/2022   ALKPHOS 68 07/05/2016   AST 21 10/06/2022   ALT 45 10/06/2022   ANIONGAP 6 01/25/2022   Last lipids Lab  Results  Component Value Date   CHOL 145 10/06/2022   HDL 43 10/06/2022   LDLCALC 78 10/06/2022   TRIG 137 10/06/2022   CHOLHDL 3.4 10/06/2022   Last hemoglobin A1c Lab Results  Component Value Date   HGBA1C 7.8 (A) 07/16/2023   Last thyroid functions No results found  for: "TSH", "T3TOTAL", "T4TOTAL", "THYROIDAB" Last vitamin D No results found for: "25OHVITD2", "25OHVITD3", "VD25OH" Last vitamin B12 and Folate No results found for: "VITAMINB12", "FOLATE"    The 10-year ASCVD risk score (Arnett DK, et al., 2019) is: 24.4%    Assessment & Plan:  Hypertension, unspecified type Assessment & Plan: Blood pressure stable here today, no changes made to medications and appropriate refills sent to pharmacy.    Orders: -     Benazepril HCl; Take 1 tablet (40 mg total) by mouth daily.  Dispense: 90 tablet; Refill: 1 -     hydroCHLOROthiazide; Take 1 tablet (25 mg total) by mouth daily.  Dispense: 90 tablet; Refill: 1  Type 2 diabetes mellitus without complication, without long-term current use of insulin (HCC) Assessment & Plan: A1c increased in November, plan to increase Ozempic to 1 mg weekly, continue Metformin and plan to recheck labs at follow up. Referral to Ophthalmology placed.  Orders: -     Semaglutide (1 MG/DOSE); Inject 1 mg as directed once a week.  Dispense: 3 mL; Refill: 1 -     Ambulatory referral to Ophthalmology     Return in about 2 months (around 10/18/2023) for after 2/25, follow up on a1c.    Margarita Mail, DO

## 2023-08-17 NOTE — Assessment & Plan Note (Signed)
A1c increased in November, plan to increase Ozempic to 1 mg weekly, continue Metformin and plan to recheck labs at follow up. Referral to Ophthalmology placed.

## 2023-09-19 DIAGNOSIS — Z1211 Encounter for screening for malignant neoplasm of colon: Secondary | ICD-10-CM | POA: Diagnosis not present

## 2023-09-21 ENCOUNTER — Other Ambulatory Visit: Payer: Self-pay

## 2023-09-21 ENCOUNTER — Ambulatory Visit: Payer: BC Managed Care – PPO | Admitting: Internal Medicine

## 2023-09-21 ENCOUNTER — Encounter: Payer: Self-pay | Admitting: Internal Medicine

## 2023-09-21 VITALS — BP 134/86 | HR 98 | Temp 98.8°F | Resp 16 | Ht 70.0 in | Wt 214.8 lb

## 2023-09-21 DIAGNOSIS — E119 Type 2 diabetes mellitus without complications: Secondary | ICD-10-CM

## 2023-09-21 DIAGNOSIS — Z7984 Long term (current) use of oral hypoglycemic drugs: Secondary | ICD-10-CM

## 2023-09-21 DIAGNOSIS — J0100 Acute maxillary sinusitis, unspecified: Secondary | ICD-10-CM

## 2023-09-21 DIAGNOSIS — R051 Acute cough: Secondary | ICD-10-CM

## 2023-09-21 DIAGNOSIS — E1169 Type 2 diabetes mellitus with other specified complication: Secondary | ICD-10-CM

## 2023-09-21 MED ORDER — AMOXICILLIN-POT CLAVULANATE 875-125 MG PO TABS: 1.0000 | ORAL_TABLET | Freq: Two times a day (BID) | ORAL | 0 refills | Status: AC

## 2023-09-21 MED ORDER — BENZONATATE 100 MG PO CAPS: 100.0000 mg | ORAL_CAPSULE | Freq: Two times a day (BID) | ORAL | 0 refills | Status: DC | PRN

## 2023-09-21 MED ORDER — METFORMIN HCL 500 MG PO TABS: 500.0000 mg | ORAL_TABLET | Freq: Two times a day (BID) | ORAL | 1 refills | Status: DC

## 2023-09-21 MED ORDER — METHYLPREDNISOLONE 4 MG PO TBPK: ORAL_TABLET | ORAL | 0 refills | Status: DC

## 2023-09-21 NOTE — Progress Notes (Signed)
Acute Office Visit  Subjective:     Patient ID: Ian Leblanc, male    DOB: 08/31/1960, 63 y.o.   MRN: [id number]  Chief Complaint  Patient presents with   URI    Cough, congestion, runny nose, low grade fever for 2 weeks    URI  Associated symptoms include congestion, coughing, sinus pain and wheezing. Pertinent negatives include no abdominal pain, chest pain, diarrhea, ear pain, nausea, sore throat or vomiting.   Patient is in today for cough, congestion, runny nose for 2 weeks.   URI Compliant:  -Fever: yes 101 last night  -Cough: yes, productive gray and thick -Shortness of breath: yes with exertion  -Wheezing: yes -Nasal congestion: yes -Runny nose: yes -Post nasal drip: no -Sneezing: yes -Sore throat: no -Sinus pressure: yes -Face pain: yes -Ear pain: no  -Ear pressure: no  -Vomiting: no -Sick contacts: yes -Context: worse  -Treatments attempted: Coricidin, Advil, Robatussin   Review of Systems  Constitutional:  Positive for chills, fever and malaise/fatigue.  HENT:  Positive for congestion and sinus pain. Negative for ear pain and sore throat.   Respiratory:  Positive for cough, sputum production, shortness of breath and wheezing.   Cardiovascular:  Negative for chest pain.  Gastrointestinal:  Negative for abdominal pain, diarrhea, nausea and vomiting.        Objective:    BP 134/86 (Cuff Size: Large)   Pulse 98   Temp 98.8 F (37.1 C) (Oral)   Resp 16   Ht 5\' 10"  (1.778 m)   Wt 214 lb 12.8 oz (97.4 kg)   SpO2 97%   BMI 30.82 kg/m  BP Readings from Last 3 Encounters:  09/21/23 134/86  08/17/23 138/76  08/01/23 (!) 141/95   Wt Readings from Last 3 Encounters:  09/21/23 214 lb 12.8 oz (97.4 kg)  07/16/23 226 lb 8 oz (102.7 kg)  06/21/23 225 lb (102.1 kg)      Physical Exam Constitutional:      Appearance: Normal appearance.  HENT:     Head: Normocephalic and atraumatic.     Right Ear: Tympanic membrane, ear canal and external ear  normal.     Left Ear: Tympanic membrane, ear canal and external ear normal.     Nose: Congestion and rhinorrhea present.     Mouth/Throat:     Mouth: Mucous membranes are moist.     Pharynx: Oropharynx is clear.  Eyes:     Conjunctiva/sclera: Conjunctivae normal.  Cardiovascular:     Rate and Rhythm: Normal rate and regular rhythm.  Pulmonary:     Effort: Pulmonary effort is normal.     Breath sounds: Normal breath sounds. No wheezing, rhonchi or rales.  Musculoskeletal:     Cervical back: No tenderness.  Lymphadenopathy:     Cervical: No cervical adenopathy.  Skin:    General: Skin is warm and dry.  Neurological:     General: No focal deficit present.     Mental Status: He is alert. Mental status is at baseline.  Psychiatric:        Mood and Affect: Mood normal.        Behavior: Behavior normal.     No results found for any visits on 09/21/23.      Assessment & Plan:   1. Acute non-recurrent maxillary sinusitis (Primary)/ Acute cough: Treat with antibiotic and steroid pack. Will prescribe cough suppressants as well. Continue OTC decongestant.   - methylPREDNISolone (MEDROL DOSEPAK) 4 MG TBPK tablet; Day 1: Take  8 mg (2 tablets) before breakfast, 4 mg (1 tablet) after lunch, 4 mg (1 tablet) after supper, and 8 mg (2 tablets) at bedtime. Day 2:Take 4 mg (1 tablet) before breakfast, 4 mg (1 tablet) after lunch, 4 mg (1 tablet) after supper, and 8 mg (2 tablets) at bedtime. Day 3: Take 4 mg (1 tablet) before breakfast, 4 mg (1 tablet) after lunch, 4 mg (1 tablet) after supper, and 4 mg (1 tablet) at bedtime. Day 4: Take 4 mg (1 tablet) before breakfast, 4 mg (1 tablet) after lunch, and 4 mg (1 tablet) at bedtime. Day 5: Take 4 mg (1 tablet) before breakfast and 4 mg (1 tablet) at bedtime. Day 6: Take 4 mg (1 tablet) before breakfast.  Dispense: 1 each; Refill: 0 - amoxicillin-clavulanate (AUGMENTIN) 875-125 MG tablet; Take 1 tablet by mouth 2 (two) times daily for 5 days.  Dispense:  10 tablet; Refill: 0  2. Type 2 diabetes mellitus without complication, without long-term current use of insulin (HCC): A1c increased at LOV, increase Metformin to 500 mg BID.   - metFORMIN (GLUCOPHAGE) 500 MG tablet; Take 1 tablet (500 mg total) by mouth 2 (two) times daily with a meal.  Dispense: 90 tablet; Refill: 1  Return for already scheduled - please remove CPE on the 2/24 and keep the 2/28 follow up.  Margarita Mail, DO

## 2023-10-04 DIAGNOSIS — Z1211 Encounter for screening for malignant neoplasm of colon: Secondary | ICD-10-CM | POA: Diagnosis not present

## 2023-10-04 DIAGNOSIS — K635 Polyp of colon: Secondary | ICD-10-CM | POA: Diagnosis not present

## 2023-10-04 DIAGNOSIS — Z860100 Personal history of colon polyps, unspecified: Secondary | ICD-10-CM | POA: Diagnosis not present

## 2023-10-04 DIAGNOSIS — K5730 Diverticulosis of large intestine without perforation or abscess without bleeding: Secondary | ICD-10-CM | POA: Diagnosis not present

## 2023-10-09 NOTE — Progress Notes (Deleted)
 Name: Ian Leblanc   MRN: [id number]    DOB: Mar 18, 1961   Date:10/09/2023       Progress Note  Subjective  Chief Complaint  No chief complaint on file.   HPI  Patient presents for annual CPE ***.    Diet: *** Exercise: *** Last Dental Exam: **** Last Eye Exam: ***  Depression: phq 9 is {gen pos [id number]}    09/21/2023    1:07 PM 07/16/2023    3:11 PM 06/21/2023    3:41 PM 05/24/2023   11:38 AM 04/26/2023   11:19 AM  Depression screen PHQ 2/9  Decreased Interest 0 0 0 0 0  Down, Depressed, Hopeless 0  0 0 0  PHQ - 2 Score 0 0 0 0 0  Altered sleeping   0 0 0  Tired, decreased energy   0 0 0  Change in appetite   0 0 0  Feeling bad or failure about yourself    0 0 0  Trouble concentrating   0 0 0  Moving slowly or fidgety/restless   0 0 0  Suicidal thoughts   0 0 0  PHQ-9 Score   0 0 0  Difficult doing work/chores   Not difficult at all Not difficult at all Not difficult at all    Hypertension:  BP Readings from Last 3 Encounters:  09/21/23 134/86  08/17/23 138/76  08/01/23 (!) 141/95    Obesity: Wt Readings from Last 3 Encounters:  09/21/23 214 lb 12.8 oz (97.4 kg)  07/16/23 226 lb 8 oz (102.7 kg)  06/21/23 225 lb (102.1 kg)   BMI Readings from Last 3 Encounters:  09/21/23 30.82 kg/m  08/17/23 32.50 kg/m  07/16/23 32.50 kg/m     Flowsheet Row Office Visit from 08/17/2023 in [facility]  AUDIT-C Score 2       ***  Married STD testing and prevention (HIV/chl/gon/syphilis):  {yes/no/default n/a:21102::"not applicable"} Sexual history:  Hep C Screening: completed Skin cancer: Discussed monitoring for atypical lesions Colorectal cancer: *** Prostate cancer:  yes Lab Results  Component Value Date   PSA 1.25 10/06/2022   PSA 0.95 10/15/2020   PSA 1.0 05/23/2017     Lung cancer:  Low Dose CT Chest recommended if Age 39-80 years, 30 pack-year currently smoking OR have quit w/in 15years. Patient  {ACTION; IS/IS  [id number]} a candidate for screening   AAA: The USPSTF recommends one-time screening with ultrasonography in men ages 31 to 75 years who have ever smoked. Patient   {ACTION; IS/IS [id number]} a candidate for screening  ECG:  ***  Vaccines:*** reviewed with the patient.   Advanced Care Planning: A voluntary discussion about advance care planning including the explanation and discussion of advance directives.  Discussed health care proxy and Living will, and the patient was able to identify a health care proxy as ***.  Patient {DOES_DOES MWU:13244} have a living will and power of attorney of health care   Patient Active Problem List   Diagnosis Date Noted   Malignant neoplasm of bladder (HCC) 12/07/2022   Chronic rhinitis 08/27/2020   Controlled type 2 diabetes mellitus with hyperglycemia, without long-term current use of insulin (HCC) 07/04/2019   Allergic rhinitis 07/03/2019   Cervicalgia 02/04/2015   Diabetes mellitus type 2, uncomplicated (HCC) 06/08/2014   HTN (hypertension) 10/10/2012   HLD (hyperlipidemia) 10/10/2012   History of colonic polyps 10/10/2012    Past Surgical History:  Procedure Laterality Date   BACK SURGERY  cyst removal. no metal in his back   BLADDER INSTILLATION N/A 01/26/2022   Procedure: BLADDER INSTILLATION OF GEMCITABINE;  Surgeon: Vanna Scotland, MD;  Location: [facility];  Service: Urology;  Laterality: N/A;   BLADDER INSTILLATION N/A 12/25/2022   Procedure: BLADDER INSTILLATION OF GEMCITABINE;  Surgeon: Vanna Scotland, MD;  Location: [facility];  Service: Urology;  Laterality: N/A;   CYSTOSCOPY W/ RETROGRADES Bilateral 01/26/2022   Procedure: CYSTOSCOPY WITH RETROGRADE PYELOGRAM;  Surgeon: Vanna Scotland, MD;  Location: [facility];  Service: Urology;  Laterality: Bilateral;   CYSTOSCOPY W/ RETROGRADES Bilateral 12/25/2022   Procedure: CYSTOSCOPY WITH RETROGRADE PYELOGRAM;  Surgeon: Vanna Scotland, MD;  Location: [facility];  Service: Urology;  Laterality:  Bilateral;   TRANSURETHRAL RESECTION OF BLADDER TUMOR N/A 01/10/2021   Procedure: TRANSURETHRAL RESECTION OF BLADDER TUMOR (TURBT) WITH GEMCITABINE;  Surgeon: Vanna Scotland, MD;  Location: [facility];  Service: Urology;  Laterality: N/A;   TRANSURETHRAL RESECTION OF BLADDER TUMOR N/A 01/26/2022   Procedure: TRANSURETHRAL RESECTION OF BLADDER TUMOR (TURBT);  Surgeon: Vanna Scotland, MD;  Location: [facility];  Service: Urology;  Laterality: N/A;   TRANSURETHRAL RESECTION OF BLADDER TUMOR N/A 12/25/2022   Procedure: TRANSURETHRAL RESECTION OF BLADDER TUMOR (TURBT);  Surgeon: Vanna Scotland, MD;  Location: [facility];  Service: Urology;  Laterality: N/A;    Family History  Problem Relation Age of Onset   Prostate cancer Paternal Grandfather     Social History   Socioeconomic History   Marital status: Married    Spouse name: Aggie Cosier   Number of children: Not on file   Years of education: Not on file   Highest education level: 12th grade  Occupational History   Occupation: Transport planner, Physiological scientist  Tobacco Use   Smoking status: Former    Current packs/day: 0.00    Types: Cigarettes    Quit date: 07/17/2003    Years since quitting: 20.2    Passive exposure: Past   Smokeless tobacco: Never  Vaping Use   Vaping status: Never Used  Substance and Sexual Activity   Alcohol use: Yes    Comment: wine occ   Drug use: No   Sexual activity: Yes    Birth control/protection: None  Other Topics Concern   Not on file  Social History Narrative   Patient lives with wife only. Feels safe in his home.    Social Drivers of Corporate investment banker Strain: Low Risk  (08/15/2023)   Overall Financial Resource Strain (CARDIA)    Difficulty of Paying Living Expenses: Not hard at all  Food Insecurity: No Food Insecurity (08/15/2023)   Hunger Vital Sign    Worried About Running Out of Food in the Last Year: Never true    Ran Out of Food in the Last Year: Never true  Recent Concern:  Food Insecurity - Food Insecurity Present (06/18/2023)   Hunger Vital Sign    Worried About Running Out of Food in the Last Year: Sometimes true    Ran Out of Food in the Last Year: Never true  Transportation Needs: No Transportation Needs (08/15/2023)   PRAPARE - Administrator, Civil Service (Medical): No    Lack of Transportation (Non-Medical): No  Physical Activity: Insufficiently Active (08/15/2023)   Exercise Vital Sign    Days of Exercise per Week: 3 days    Minutes of Exercise per Session: 30 min  Stress: Stress Concern Present (08/15/2023)   [employer] of Occupational Health - Occupational Stress Questionnaire  Feeling of Stress : To some extent  Social Connections: Moderately Isolated (08/15/2023)   Social Connection and Isolation Panel [NHANES]    Frequency of Communication with Friends and Family: Three times a week    Frequency of Social Gatherings with Friends and Family: Twice a week    Attends Religious Services: Never    Database administrator or Organizations: No    Attends Engineer, structural: Not on file    Marital Status: Married  Catering manager Violence: Not At Risk (12/07/2022)   Humiliation, Afraid, Rape, and Kick questionnaire    Fear of Current or Ex-Partner: No    Emotionally Abused: No    Physically Abused: No    Sexually Abused: No     Current Outpatient Medications:    atorvastatin (LIPITOR) 80 MG tablet, Take 1 tablet (80 mg total) by mouth at bedtime., Disp: 90 tablet, Rfl: 1   benazepril (LOTENSIN) 40 MG tablet, Take 1 tablet (40 mg total) by mouth daily., Disp: 90 tablet, Rfl: 1   benzonatate (TESSALON) 100 MG capsule, Take 1 capsule (100 mg total) by mouth 2 (two) times daily as needed for cough., Disp: 20 capsule, Rfl: 0   fluticasone (FLONASE) 50 MCG/ACT nasal spray, Place 2 sprays into both nostrils daily., Disp: 16 g, Rfl: 6   hydrochlorothiazide (HYDRODIURIL) 25 MG tablet, Take 1 tablet (25 mg total) by mouth  daily., Disp: 90 tablet, Rfl: 1   HYDROcodone-acetaminophen (NORCO/VICODIN) 5-325 MG tablet, Take 1-2 tablets by mouth every 6 (six) hours as needed for moderate pain., Disp: 5 tablet, Rfl: 0   metFORMIN (GLUCOPHAGE) 500 MG tablet, Take 1 tablet (500 mg total) by mouth 2 (two) times daily with a meal., Disp: 90 tablet, Rfl: 1   methylPREDNISolone (MEDROL DOSEPAK) 4 MG TBPK tablet, Day 1: Take 8 mg (2 tablets) before breakfast, 4 mg (1 tablet) after lunch, 4 mg (1 tablet) after supper, and 8 mg (2 tablets) at bedtime. Day 2:Take 4 mg (1 tablet) before breakfast, 4 mg (1 tablet) after lunch, 4 mg (1 tablet) after supper, and 8 mg (2 tablets) at bedtime. Day 3: Take 4 mg (1 tablet) before breakfast, 4 mg (1 tablet) after lunch, 4 mg (1 tablet) after supper, and 4 mg (1 tablet) at bedtime. Day 4: Take 4 mg (1 tablet) before breakfast, 4 mg (1 tablet) after lunch, and 4 mg (1 tablet) at bedtime. Day 5: Take 4 mg (1 tablet) before breakfast and 4 mg (1 tablet) at bedtime. Day 6: Take 4 mg (1 tablet) before breakfast., Disp: 1 each, Rfl: 0   Multiple Vitamins-Minerals (MULTIVITAMIN PO), Take 1 tablet by mouth daily. Over 50 mvi qd, Disp: , Rfl:    Semaglutide, 1 MG/DOSE, 4 MG/3ML SOPN, Inject 1 mg as directed once a week., Disp: 3 mL, Rfl: 1   tiZANidine (ZANAFLEX) 4 MG tablet, Take 1 tablet (4 mg total) by mouth at bedtime as needed., Disp: 30 tablet, Rfl: 0  Allergies  Allergen Reactions   Crestor [Rosuvastatin] Other (See Comments)    Severe myalgias     ROS  ***   Objective  There were no vitals filed for this visit.  There is no height or weight on file to calculate BMI.  Physical Exam ***  {Show previous labs (optional):23779}  Assessment & Plan There are no diagnoses linked to this encounter.   -Prostate cancer screening and PSA options (with potential risks and benefits of testing vs not testing) were discussed along with  recent recs/guidelines. -USPSTF grade A and B  recommendations reviewed with patient; age-appropriate recommendations, preventive care, screening tests, etc discussed and encouraged; healthy living encouraged; see AVS for patient education given to patient -Discussed importance of 150 minutes of physical activity weekly, eat two servings of fish weekly, eat one serving of tree nuts ( cashews, pistachios, pecans, almonds.Marland Kitchen) every other day, eat 6 servings of fruit/vegetables daily and drink plenty of water and avoid sweet beverages.  -Reviewed Health Maintenance: {yes/no/default n/a:21102::"not applicable"}

## 2023-10-15 ENCOUNTER — Encounter: Payer: BC Managed Care – PPO | Admitting: Internal Medicine

## 2023-10-19 ENCOUNTER — Encounter: Payer: Self-pay | Admitting: Internal Medicine

## 2023-10-19 ENCOUNTER — Other Ambulatory Visit: Payer: Self-pay

## 2023-10-19 ENCOUNTER — Ambulatory Visit: Payer: BC Managed Care – PPO | Admitting: Internal Medicine

## 2023-10-19 VITALS — BP 132/74 | HR 98 | Temp 98.1°F | Resp 16 | Ht 70.0 in | Wt 216.0 lb

## 2023-10-19 DIAGNOSIS — E782 Mixed hyperlipidemia: Secondary | ICD-10-CM | POA: Diagnosis not present

## 2023-10-19 DIAGNOSIS — Z125 Encounter for screening for malignant neoplasm of prostate: Secondary | ICD-10-CM

## 2023-10-19 DIAGNOSIS — M5432 Sciatica, left side: Secondary | ICD-10-CM

## 2023-10-19 DIAGNOSIS — J309 Allergic rhinitis, unspecified: Secondary | ICD-10-CM

## 2023-10-19 DIAGNOSIS — Z7984 Long term (current) use of oral hypoglycemic drugs: Secondary | ICD-10-CM | POA: Diagnosis not present

## 2023-10-19 DIAGNOSIS — R972 Elevated prostate specific antigen [PSA]: Secondary | ICD-10-CM

## 2023-10-19 DIAGNOSIS — E119 Type 2 diabetes mellitus without complications: Secondary | ICD-10-CM

## 2023-10-19 LAB — POCT GLYCOSYLATED HEMOGLOBIN (HGB A1C): Hemoglobin A1C: 6.4 % — AB (ref 4.0–5.6)

## 2023-10-19 MED ORDER — NAPROXEN 500 MG PO TABS: 500.0000 mg | ORAL_TABLET | Freq: Two times a day (BID) | ORAL | 0 refills | Status: AC

## 2023-10-19 MED ORDER — TIZANIDINE HCL 4 MG PO TABS: 4.0000 mg | ORAL_TABLET | Freq: Every evening | ORAL | 0 refills | Status: DC | PRN

## 2023-10-19 NOTE — Assessment & Plan Note (Signed)
 A1c controlled at 6.4%, will continue Metformin 500 mg once - twice a day and Ozempic 1 mg weekly. Labs due.

## 2023-10-19 NOTE — Assessment & Plan Note (Signed)
 Restart Flonase, recommend sinus rinses as well.

## 2023-10-19 NOTE — Progress Notes (Signed)
 Established Patient Office Visit  Subjective   Patient ID: Ian Leblanc, male    DOB: 1961-05-27  Age: 63 y.o. MRN: [id number]  Chief Complaint  Patient presents with   Medical Management of Chronic Issues    2 month recheck    HPI  Patient is here for follow up on chronic medical conditions. Patient states his left sided sciatica is bothering him again and is worried about getting on a flight to [place] in a few days.   Hypertension: -Medications: Benazepril 40 mg, hydrochlorothiazide 25 mg  -Patient is compliant with above medications and reports no side effects. -Checking BP at home (average): 140-145/90-95 -Denies any SOB, CP, vision changes, LE edema or symptoms of hypotension  HLD: -Medications: Lipitor 80 mg -Patient is compliant with above medications and reports no side effects.  -Last lipid panel: Lipid Panel     Component Value Date/Time   CHOL 145 10/06/2022 1204   TRIG 137 10/06/2022 1204   HDL 43 10/06/2022 1204   CHOLHDL 3.4 10/06/2022 1204   VLDL 20 07/05/2016 0843   LDLCALC 78 10/06/2022 1204   The 10-year ASCVD risk score (Arnett DK, et al., 2019) is: 22.8%   Values used to calculate the score:     Age: 23 years     Sex: Male     Is Non-Hispanic African American: No     Diabetic: Yes     Tobacco smoker: No     Systolic Blood Pressure: 132 mmHg     Is BP treated: Yes     HDL Cholesterol: 43 mg/dL     Total Cholesterol: 145 mg/dL   Diabetes, Type 2: -Last A1c 11/24 7.8% -Medications: Metformin 500 mg BID, Ozempic increased to 1 mg at LOV -Patient is compliant with the above medications and reports no side effects.  -Checking BG at home: No -Eye exam: Due, referral placed previously  -Foot exam: UTD 8/24 -Microalbumin: UTD  8/24 -Statin: yes -PNA vaccine: UTD -Denies symptoms of hypoglycemia, polyuria, polydipsia, numbness extremities, foot ulcers/trauma.   Seasonal Allergies: -Had been taking Flonase and Zyrtec but not currently -Had a  sinus infection a few weeks ago, still having mucus and sinus pressure  Health Maintenance: -Blood work due -Colonoscopy 2/25 - repeat in 7 years    Patient Active Problem List   Diagnosis Date Noted   Malignant neoplasm of bladder (HCC) 12/07/2022   Chronic rhinitis 08/27/2020   Controlled type 2 diabetes mellitus with hyperglycemia, without long-term current use of insulin (HCC) 07/04/2019   Allergic rhinitis 07/03/2019   Cervicalgia 02/04/2015   Diabetes mellitus type 2, uncomplicated (HCC) 06/08/2014   HTN (hypertension) 10/10/2012   HLD (hyperlipidemia) 10/10/2012   History of colonic polyps 10/10/2012   Past Medical History:  Diagnosis Date   Abnormal EKG 07/16/2013   Arthritis    Cancer (HCC)    bladder   Cervical spine disease 02/04/2015   Chest pain 07/16/2013   Diabetes mellitus without complication (HCC)    History of kidney stones    Hyperglycemia    Hyperlipidemia    Hypertension    Neck pain on left side 02/04/2015   Numbness of fingers 02/04/2015   Paresthesia of left arm 02/04/2015   Screening for prostate cancer 12/09/2014   Sessile colonic polyp    Past Surgical History:  Procedure Laterality Date   BACK SURGERY     cyst removal. no metal in his back   BLADDER INSTILLATION N/A 01/26/2022   Procedure: BLADDER INSTILLATION OF  GEMCITABINE;  Surgeon: Vanna Scotland, MD;  Location: [facility];  Service: Urology;  Laterality: N/A;   BLADDER INSTILLATION N/A 12/25/2022   Procedure: BLADDER INSTILLATION OF GEMCITABINE;  Surgeon: Vanna Scotland, MD;  Location: [facility];  Service: Urology;  Laterality: N/A;   CYSTOSCOPY W/ RETROGRADES Bilateral 01/26/2022   Procedure: CYSTOSCOPY WITH RETROGRADE PYELOGRAM;  Surgeon: Vanna Scotland, MD;  Location: [facility];  Service: Urology;  Laterality: Bilateral;   CYSTOSCOPY W/ RETROGRADES Bilateral 12/25/2022   Procedure: CYSTOSCOPY WITH RETROGRADE PYELOGRAM;  Surgeon: Vanna Scotland, MD;  Location: [facility];  Service:  Urology;  Laterality: Bilateral;   TRANSURETHRAL RESECTION OF BLADDER TUMOR N/A 01/10/2021   Procedure: TRANSURETHRAL RESECTION OF BLADDER TUMOR (TURBT) WITH GEMCITABINE;  Surgeon: Vanna Scotland, MD;  Location: [facility];  Service: Urology;  Laterality: N/A;   TRANSURETHRAL RESECTION OF BLADDER TUMOR N/A 01/26/2022   Procedure: TRANSURETHRAL RESECTION OF BLADDER TUMOR (TURBT);  Surgeon: Vanna Scotland, MD;  Location: [facility];  Service: Urology;  Laterality: N/A;   TRANSURETHRAL RESECTION OF BLADDER TUMOR N/A 12/25/2022   Procedure: TRANSURETHRAL RESECTION OF BLADDER TUMOR (TURBT);  Surgeon: Vanna Scotland, MD;  Location: [facility];  Service: Urology;  Laterality: N/A;   Social History   Tobacco Use   Smoking status: Former    Current packs/day: 0.00    Types: Cigarettes    Quit date: 07/17/2003    Years since quitting: 20.2    Passive exposure: Past   Smokeless tobacco: Never  Vaping Use   Vaping status: Never Used  Substance Use Topics   Alcohol use: Yes    Comment: wine occ   Drug use: No   Social History   Socioeconomic History   Marital status: Married    Spouse name: Aggie Cosier   Number of children: Not on file   Years of education: Not on file   Highest education level: 12th grade  Occupational History   Occupation: Transport planner, Physiological scientist  Tobacco Use   Smoking status: Former    Current packs/day: 0.00    Types: Cigarettes    Quit date: 07/17/2003    Years since quitting: 20.2    Passive exposure: Past   Smokeless tobacco: Never  Vaping Use   Vaping status: Never Used  Substance and Sexual Activity   Alcohol use: Yes    Comment: wine occ   Drug use: No   Sexual activity: Yes    Birth control/protection: None  Other Topics Concern   Not on file  Social History Narrative   Patient lives with wife only. Feels safe in his home.    Social Drivers of Corporate investment banker Strain: Low Risk  (08/15/2023)   Overall Financial Resource Strain  (CARDIA)    Difficulty of Paying Living Expenses: Not hard at all  Food Insecurity: No Food Insecurity (08/15/2023)   Hunger Vital Sign    Worried About Running Out of Food in the Last Year: Never true    Ran Out of Food in the Last Year: Never true  Recent Concern: Food Insecurity - Food Insecurity Present (06/18/2023)   Hunger Vital Sign    Worried About Running Out of Food in the Last Year: Sometimes true    Ran Out of Food in the Last Year: Never true  Transportation Needs: No Transportation Needs (08/15/2023)   PRAPARE - Administrator, Civil Service (Medical): No    Lack of Transportation (Non-Medical): No  Physical Activity: Insufficiently Active (08/15/2023)   Exercise Vital  Sign    Days of Exercise per Week: 3 days    Minutes of Exercise per Session: 30 min  Stress: Stress Concern Present (08/15/2023)   [employer] of Occupational Health - Occupational Stress Questionnaire    Feeling of Stress : To some extent  Social Connections: Moderately Isolated (08/15/2023)   Social Connection and Isolation Panel [NHANES]    Frequency of Communication with Friends and Family: Three times a week    Frequency of Social Gatherings with Friends and Family: Twice a week    Attends Religious Services: Never    Database administrator or Organizations: No    Attends Engineer, structural: Not on file    Marital Status: Married  Catering manager Violence: Not At Risk (12/07/2022)   Humiliation, Afraid, Rape, and Kick questionnaire    Fear of Current or Ex-Partner: No    Emotionally Abused: No    Physically Abused: No    Sexually Abused: No   Family Status  Relation Name Status   Mother  Alive   Father  Deceased       pneumonia   Sister  Alive   Brother  Alive   PGF  Deceased  No partnership data on file   Family History  Problem Relation Age of Onset   Prostate cancer Paternal Grandfather    Allergies  Allergen Reactions   Crestor [Rosuvastatin] Other  (See Comments)    Severe myalgias      Review of Systems  Constitutional:  Negative for chills and fever.  HENT:  Positive for congestion. Negative for sinus pain.   Respiratory:  Negative for shortness of breath.   Cardiovascular:  Negative for chest pain.  Gastrointestinal:  Negative for abdominal pain, constipation, heartburn, nausea and vomiting.      Objective:     BP 132/74 (Cuff Size: Large)   Pulse 98   Temp 98.1 F (36.7 C) (Oral)   Resp 16   Ht 5\' 10"  (1.778 m)   Wt 216 lb (98 kg)   SpO2 97%   BMI 30.99 kg/m  BP Readings from Last 3 Encounters:  10/19/23 132/74  09/21/23 134/86  08/17/23 138/76   Wt Readings from Last 3 Encounters:  10/19/23 216 lb (98 kg)  09/21/23 214 lb 12.8 oz (97.4 kg)  07/16/23 226 lb 8 oz (102.7 kg)      Physical Exam Constitutional:      Appearance: Normal appearance.  HENT:     Head: Normocephalic and atraumatic.     Mouth/Throat:     Mouth: Mucous membranes are moist.     Pharynx: Oropharynx is clear.  Eyes:     Conjunctiva/sclera: Conjunctivae normal.  Cardiovascular:     Rate and Rhythm: Normal rate and regular rhythm.  Pulmonary:     Effort: Pulmonary effort is normal.     Breath sounds: Normal breath sounds.  Skin:    General: Skin is warm and dry.  Neurological:     General: No focal deficit present.     Mental Status: He is alert. Mental status is at baseline.  Psychiatric:        Mood and Affect: Mood normal.        Behavior: Behavior normal.      No results found for any visits on 10/19/23.  Last CBC Lab Results  Component Value Date   WBC 12.3 (H) 01/05/2023   HGB 13.8 01/05/2023   HCT 40.2 01/05/2023   MCV 87.0 01/05/2023  MCH 29.9 01/05/2023   RDW 12.3 01/05/2023   PLT 180 01/05/2023   Last metabolic panel Lab Results  Component Value Date   GLUCOSE 85 01/05/2023   NA 138 01/05/2023   K 3.9 01/05/2023   CL 103 01/05/2023   CO2 25 01/05/2023   BUN 10 01/05/2023   CREATININE 0.73  01/05/2023   EGFR 103 10/06/2022   CALCIUM 9.2 01/05/2023   PROT 7.1 10/06/2022   ALBUMIN 4.6 07/05/2016   BILITOT 0.7 10/06/2022   ALKPHOS 68 07/05/2016   AST 21 10/06/2022   ALT 45 10/06/2022   ANIONGAP 6 01/25/2022   Last lipids Lab Results  Component Value Date   CHOL 145 10/06/2022   HDL 43 10/06/2022   LDLCALC 78 10/06/2022   TRIG 137 10/06/2022   CHOLHDL 3.4 10/06/2022   Last hemoglobin A1c Lab Results  Component Value Date   HGBA1C 7.8 (A) 07/16/2023   Last thyroid functions No results found for: "TSH", "T3TOTAL", "T4TOTAL", "THYROIDAB" Last vitamin D No results found for: "25OHVITD2", "25OHVITD3", "VD25OH" Last vitamin B12 and Folate No results found for: "VITAMINB12", "FOLATE"    The 10-year ASCVD risk score (Arnett DK, et al., 2019) is: 22.8%    Assessment & Plan:  Type 2 diabetes mellitus without complication, without long-term current use of insulin (HCC) Assessment & Plan: A1c controlled at 6.4%, will continue Metformin 500 mg once - twice a day and Ozempic 1 mg weekly. Labs due.   Orders: -     POCT glycosylated hemoglobin (Hb A1C) -     CBC with Differential/Platelet -     COMPLETE METABOLIC PANEL WITH GFR  Sciatica of left side Assessment & Plan: Prescribe scheduled high dose anti-inflammatory BID x 10 days and muscle relaxer as needed, discussed heating pad and stretches as well.   Orders: -     Naproxen; Take 1 tablet (500 mg total) by mouth 2 (two) times daily with a meal for 10 days.  Dispense: 20 tablet; Refill: 0 -     tiZANidine HCl; Take 1 tablet (4 mg total) by mouth at bedtime as needed.  Dispense: 30 tablet; Refill: 0  Mixed hyperlipidemia Assessment & Plan: Recheck cholesterol today, continue statin.   Orders: -     Lipid panel  Prostate cancer screening -     PSA  Allergic rhinitis, unspecified seasonality, unspecified trigger Assessment & Plan: Restart Flonase, recommend sinus rinses as well.       Return in about  6 months (around 04/17/2024).    Margarita Mail, DO

## 2023-10-19 NOTE — Assessment & Plan Note (Signed)
 Recheck cholesterol today, continue statin.

## 2023-10-19 NOTE — Assessment & Plan Note (Signed)
 Prescribe scheduled high dose anti-inflammatory BID x 10 days and muscle relaxer as needed, discussed heating pad and stretches as well.

## 2023-10-20 ENCOUNTER — Other Ambulatory Visit: Payer: Self-pay | Admitting: Internal Medicine

## 2023-10-20 DIAGNOSIS — E119 Type 2 diabetes mellitus without complications: Secondary | ICD-10-CM

## 2023-10-20 LAB — CBC WITH DIFFERENTIAL/PLATELET
Absolute Lymphocytes: 1700 {cells}/uL (ref 850–3900)
Absolute Monocytes: 578 {cells}/uL (ref 200–950)
Basophils Absolute: 61 {cells}/uL (ref 0–200)
Basophils Relative: 0.9 %
Eosinophils Absolute: 136 {cells}/uL (ref 15–500)
Eosinophils Relative: 2 %
HCT: 44.6 % (ref 38.5–50.0)
Hemoglobin: 14.8 g/dL (ref 13.2–17.1)
MCH: 30 pg (ref 27.0–33.0)
MCHC: 33.2 g/dL (ref 32.0–36.0)
MCV: 90.5 fL (ref 80.0–100.0)
MPV: 12.1 fL (ref 7.5–12.5)
Monocytes Relative: 8.5 %
Neutro Abs: 4325 {cells}/uL (ref 1500–7800)
Neutrophils Relative %: 63.6 %
Platelets: 177 10*3/uL (ref 140–400)
RBC: 4.93 10*6/uL (ref 4.20–5.80)
RDW: 12.7 % (ref 11.0–15.0)
Total Lymphocyte: 25 %
WBC: 6.8 10*3/uL (ref 3.8–10.8)

## 2023-10-20 LAB — LIPID PANEL
Cholesterol: 134 mg/dL (ref <200)
HDL: 40 mg/dL (ref >=40)
LDL Cholesterol (Calc): 74 mg/dL
Non-HDL Cholesterol (Calc): 94 mg/dL (ref <130)
Total CHOL/HDL Ratio: 3.4 (calc) (ref <5.0)
Triglycerides: 110 mg/dL (ref <150)

## 2023-10-20 LAB — COMPLETE METABOLIC PANEL WITH GFR
AG Ratio: 2.4 (calc) (ref 1.0–2.5)
ALT: 49 U/L — ABNORMAL HIGH (ref 9–46)
AST: 22 U/L (ref 10–35)
Albumin: 4.8 g/dL (ref 3.6–5.1)
Alkaline phosphatase (APISO): 70 U/L (ref 35–144)
BUN: 10 mg/dL (ref 7–25)
CO2: 28 mmol/L (ref 20–32)
Calcium: 9.5 mg/dL (ref 8.6–10.3)
Chloride: 104 mmol/L (ref 98–110)
Creat: 0.75 mg/dL (ref 0.70–1.35)
Globulin: 2 g/dL (ref 1.9–3.7)
Glucose, Bld: 114 mg/dL — ABNORMAL HIGH (ref 65–99)
Potassium: 4.1 mmol/L (ref 3.5–5.3)
Sodium: 140 mmol/L (ref 135–146)
Total Bilirubin: 0.6 mg/dL (ref 0.2–1.2)
Total Protein: 6.8 g/dL (ref 6.1–8.1)
eGFR: 101 mL/min/{1.73_m2} (ref >=60)

## 2023-10-20 LAB — PSA: PSA: 4.39 ng/mL — ABNORMAL HIGH (ref <=4.00)

## 2023-10-22 ENCOUNTER — Other Ambulatory Visit: Payer: Self-pay | Admitting: Emergency Medicine

## 2023-10-22 DIAGNOSIS — E119 Type 2 diabetes mellitus without complications: Secondary | ICD-10-CM

## 2023-10-22 MED ORDER — SEMAGLUTIDE (1 MG/DOSE) 4 MG/3ML ~~LOC~~ SOPN: 1.0000 mg | PEN_INJECTOR | SUBCUTANEOUS | 1 refills | Status: DC

## 2023-10-22 NOTE — Telephone Encounter (Signed)
 Duplicate request. Rx reordered today by PCP. Requested Prescriptions  Pending Prescriptions Disp Refills   OZEMPIC, 1 MG/DOSE, 4 MG/3ML SOPN [Pharmacy Med Name: Ozempic (1 MG/DOSE) 4 MG/3ML Subcutaneous Solution Pen-injector] 3 mL 0    Sig: INJECT 1 MG AS DIRECTED ONCE A WEEK     Endocrinology:  Diabetes - GLP-1 Receptor Agonists - semaglutide Failed - 10/22/2023  3:14 PM      Failed - HBA1C in normal range and within 180 days    Hgb A1C (fingerstick)  Date Value Ref Range Status  10/05/2016 6.4 (H) <5.7 % Final    Comment:                                                                           According to the ADA Clinical Practice Recommendations for 2011, when HbA1c is used as a screening test:     >=6.5%   Diagnostic of Diabetes Mellitus            (if abnormal result is confirmed)   5.7-6.4%   Increased risk of developing Diabetes Mellitus   References:Diagnosis and Classification of Diabetes Mellitus,Diabetes Care,2011,34(Suppl 1):S62-S69 and Standards of Medical Care in         Diabetes - 2011,Diabetes Care,2011,34 (Suppl 1):S11-S61.      Hemoglobin A1C  Date Value Ref Range Status  10/19/2023 6.4 (A) 4.0 - 5.6 % Final   Hgb A1c MFr Bld  Date Value Ref Range Status  10/06/2022 6.8 (H) <5.7 % of total Hgb Final    Comment:    For someone without known diabetes, a hemoglobin A1c value of 6.5% or greater indicates that they may have  diabetes and this should be confirmed with a follow-up  test. . For someone with known diabetes, a value <7% indicates  that their diabetes is well controlled and a value  greater than or equal to 7% indicates suboptimal  control. A1c targets should be individualized based on  duration of diabetes, age, comorbid conditions, and  other considerations. . Currently, no consensus exists regarding use of hemoglobin A1c for diagnosis of diabetes for children. .          Passed - Cr in normal range and within 360 days    Creat  Date  Value Ref Range Status  10/19/2023 0.75 0.70 - 1.35 mg/dL Final   Creatinine, Urine  Date Value Ref Range Status  04/12/2023 122 20 - 320 mg/dL Final         Passed - Valid encounter within last 6 months    Recent Outpatient Visits           1 month ago Acute non-recurrent maxillary sinusitis   [facility] Margarita Mail, DO   2 months ago Hypertension, unspecified type   [facility] Margarita Mail, DO   3 months ago Type 2 diabetes mellitus without complication, without long-term current use of insulin [facility])   [facility] Berniece Salines, FNP   4 months ago Hypertension, unspecified type   [facility] Margarita Mail, DO   5 months ago Hypertension, unspecified type   [facility] Margarita Mail, [place]  Future Appointments             In 5 months Margarita Mail, DO [facility], [facility]

## 2023-10-22 NOTE — Addendum Note (Signed)
 Addended by: Margarita Mail on: 10/22/2023 01:06 PM   Modules accepted: Orders

## 2023-10-30 ENCOUNTER — Ambulatory Visit: Payer: BC Managed Care – PPO | Admitting: Urology

## 2023-10-30 VITALS — BP 150/97 | HR 96 | Ht 70.0 in | Wt 221.0 lb

## 2023-10-30 DIAGNOSIS — C672 Malignant neoplasm of lateral wall of bladder: Secondary | ICD-10-CM

## 2023-10-30 LAB — URINALYSIS, COMPLETE
Bilirubin, UA: NEGATIVE
Glucose, UA: NEGATIVE
Leukocytes,UA: NEGATIVE
Nitrite, UA: NEGATIVE
Protein,UA: NEGATIVE
RBC, UA: NEGATIVE
Specific Gravity, UA: 1.025 (ref 1.005–1.030)
Urobilinogen, Ur: 1 mg/dL (ref 0.2–1.0)
pH, UA: 6 (ref 5.0–7.5)

## 2023-10-30 LAB — MICROSCOPIC EXAMINATION

## 2023-10-30 NOTE — Progress Notes (Signed)
   10/30/23  CC: cysto   HPI: Ian Leblanc is a 63 y.o. male with a personal history of kidney stones, gross hematuria with negative workup, bladder cancer, who presents today for 3 month surveillance cystoscopy.   CT urogram on 12/02/2020 showed 1 small area within the bladder, several millimeters in size near the dome which may recheck present filling defect. Otherwise there were no additional upper tract pathology identified. Small incidental pulmonary nodule felt to be likely benign.    He underwent a transurethral resection of bladder tumor with Gemcitabine for malignant neoplasm of lateral wall of urinary bladder on 01/10/2021.    Surgical pathology revealed urinary bladder; transurethral resection: non invasive papillary urothelial carcinoma, low-grade. No muscularis propria identified.   He returned to the operating room in June 2023 with an 8 mm tumor which appeared to be a small satellite recurrence at the edge of the TUR resection bed as well as a 3 mm at the left trigone.  Surgical pathology was consistent with low-grade TA disease.  He did receive postprocedural gemcitabine.  He returned to the operating room on 12/2022 with another very small recurrence of the left lateral bladder wall retrograde pyelograms were normal.  Surgical pathology was consistent with low-grade noninvasive bladder cancer as well as an additional tumor with high-grade noninvasive bladder cancer as well as CIS in the bladder.  He completed induction BCG in 03/2023.  Will be traveling to [place] to see his grandchildren.  NED. A&Ox3.   No respiratory distress   Abd soft, NT, ND Normal phallus with bilateral descended testicles  Cystoscopy Procedure Note  Patient identification was confirmed, informed consent was obtained, and patient was prepped using Betadine solution.  Lidocaine jelly was administered per urethral meatus.     Pre-Procedure: - Inspection reveals a normal caliber ureteral  meatus.  Procedure: The flexible cystoscope was introduced without difficulty - No urethral strictures/lesions are present. - Normal prostate  - Slightly Elevated bladder neck - Bilateral ureteral orifices identified - Bladder mucosa reveals a stellate scar at the left lateral bladder wall, at the 5 o'clock position  - No bladder stones - No trabeculation - stellate scare on left lateral bladder neck area   Retroflexion shows stellate scar near bladder neck as well as small intravesical component of the prostate   Post-Procedure: - Patient tolerated the procedure well   Assessment/ Plan:  History of bladder cancer  -Intermediate risk given recurrence s/p induction BCG -NED today -Will continue to slowly spread out interval for cystoscopy, next cystoscopy in 4 months   Vanna Scotland, MD

## 2023-12-03 ENCOUNTER — Encounter: Payer: Self-pay | Admitting: Urology

## 2023-12-15 ENCOUNTER — Other Ambulatory Visit: Payer: Self-pay | Admitting: Internal Medicine

## 2023-12-15 DIAGNOSIS — E119 Type 2 diabetes mellitus without complications: Secondary | ICD-10-CM

## 2023-12-17 NOTE — Telephone Encounter (Signed)
 Requested Prescriptions  Pending Prescriptions Disp Refills   metFORMIN  (GLUCOPHAGE ) 500 MG tablet [Pharmacy Med Name: metFORMIN  HCl 500 MG Oral Tablet] 180 tablet 1    Sig: TAKE 1 TABLET BY MOUTH TWICE DAILY WITH A MEAL     Endocrinology:  Diabetes - Biguanides Failed - 12/17/2023 11:25 AM      Failed - B12 Level in normal range and within 720 days    No results found for: "VITAMINB12"       Passed - Cr in normal range and within 360 days    Creat  Date Value Ref Range Status  10/19/2023 0.75 0.70 - 1.35 mg/dL Final   Creatinine, Urine  Date Value Ref Range Status  04/12/2023 122 20 - 320 mg/dL Final         Passed - HBA1C is between 0 and 7.9 and within 180 days    Hgb A1C (fingerstick)  Date Value Ref Range Status  10/05/2016 6.4 (H) <5.7 % Final    Comment:                                                                           According to the ADA Clinical Practice Recommendations for 2011, when HbA1c is used as a screening test:     >=6.5%   Diagnostic of Diabetes Mellitus            (if abnormal result is confirmed)   5.7-6.4%   Increased risk of developing Diabetes Mellitus   References:Diagnosis and Classification of Diabetes Mellitus,Diabetes Care,2011,34(Suppl 1):S62-S69 and Standards of Medical Care in         Diabetes - 2011,Diabetes Care,2011,34 (Suppl 1):S11-S61.      Hemoglobin A1C  Date Value Ref Range Status  10/19/2023 6.4 (A) 4.0 - 5.6 % Final   Hgb A1c MFr Bld  Date Value Ref Range Status  10/06/2022 6.8 (H) <5.7 % of total Hgb Final    Comment:    For someone without known diabetes, a hemoglobin A1c value of 6.5% or greater indicates that they may have  diabetes and this should be confirmed with a follow-up  test. . For someone with known diabetes, a value <7% indicates  that their diabetes is well controlled and a value  greater than or equal to 7% indicates suboptimal  control. A1c targets should be individualized based on  duration of  diabetes, age, comorbid conditions, and  other considerations. . Currently, no consensus exists regarding use of hemoglobin A1c for diagnosis of diabetes for children. .          Passed - eGFR in normal range and within 360 days    GFR, Est African American  Date Value Ref Range Status  05/11/2020 115 > OR = 60 mL/min/1.46m2 Final   GFR, Est Non African American  Date Value Ref Range Status  05/11/2020 100 > OR = 60 mL/min/1.66m2 Final   GFR, Estimated  Date Value Ref Range Status  01/25/2022 >60 >60 mL/min Final    Comment:    (NOTE) Calculated using the CKD-EPI Creatinine Equation (2021)    eGFR  Date Value Ref Range Status  10/19/2023 101 > OR = 60 mL/min/1.3m2 Final  Passed - Valid encounter within last 6 months    Recent Outpatient Visits           1 month ago Type 2 diabetes mellitus without complication, without long-term current use of insulin (HCC)   [place] [facility] Rockney Cid, DO       Future Appointments             In 4 months Rockney Cid, DO [place] [facility], [facility]            Passed - CBC within normal limits and completed in the last 12 months    WBC  Date Value Ref Range Status  10/19/2023 6.8 3.8 - 10.8 Thousand/uL Final   RBC  Date Value Ref Range Status  10/19/2023 4.93 4.20 - 5.80 Million/uL Final   Hemoglobin  Date Value Ref Range Status  10/19/2023 14.8 13.2 - 17.1 g/dL Final   HCT  Date Value Ref Range Status  10/19/2023 44.6 38.5 - 50.0 % Final   MCHC  Date Value Ref Range Status  10/19/2023 33.2 32.0 - 36.0 g/dL Final    Comment:    For adults, a slight decrease in the calculated MCHC value (in the range of 30 to 32 g/dL) is most likely not clinically significant; however, it should be interpreted with caution in correlation with other red cell parameters and the patient's clinical condition.    [facility]  Date Value Ref Range Status  10/19/2023 30.0 27.0  - 33.0 pg Final   MCV  Date Value Ref Range Status  10/19/2023 90.5 80.0 - 100.0 fL Final   No results found for: "PLTCOUNTKUC", "LABPLAT", "POCPLA" RDW  Date Value Ref Range Status  10/19/2023 12.7 11.0 - 15.0 % Final

## 2023-12-18 ENCOUNTER — Other Ambulatory Visit: Payer: Self-pay | Admitting: Internal Medicine

## 2023-12-18 DIAGNOSIS — E782 Mixed hyperlipidemia: Secondary | ICD-10-CM

## 2023-12-20 NOTE — Telephone Encounter (Signed)
 Requested Prescriptions  Pending Prescriptions Disp Refills   atorvastatin  (LIPITOR) 80 MG tablet [Pharmacy Med Name: Atorvastatin  Calcium  80 MG Oral Tablet] 90 tablet 0    Sig: TAKE 1 TABLET BY MOUTH AT BEDTIME     Cardiovascular:  Antilipid - Statins Failed - 12/20/2023 11:52 AM      Failed - Lipid Panel in normal range within the last 12 months    Cholesterol  Date Value Ref Range Status  10/19/2023 134 <200 mg/dL Final   LDL Cholesterol (Calc)  Date Value Ref Range Status  10/19/2023 74 mg/dL (calc) Final    Comment:    Reference range: <100 . Desirable range <100 mg/dL for primary prevention;   <70 mg/dL for patients with CHD or diabetic patients  with > or = 2 CHD risk factors. Aaron Aas LDL-C is now calculated using the Martin-Hopkins  calculation, which is a validated novel method providing  better accuracy than the Friedewald equation in the  estimation of LDL-C.  Melinda Sprawls et al. Erroll Heard. 1610;960(45): 2061-2068  (http://education.[employer].com/faq/FAQ164)    HDL  Date Value Ref Range Status  10/19/2023 40 > OR = 40 mg/dL Final   Triglycerides  Date Value Ref Range Status  10/19/2023 110 <150 mg/dL Final         Passed - Patient is not pregnant      Passed - Valid encounter within last 12 months    Recent Outpatient Visits           2 months ago Type 2 diabetes mellitus without complication, without long-term current use of insulin [facility])   [facility] Rockney Cid, DO       Future Appointments             In 3 months Rockney Cid, DO [facility], [facility]

## 2024-02-06 ENCOUNTER — Other Ambulatory Visit: Payer: Self-pay | Admitting: Internal Medicine

## 2024-02-06 DIAGNOSIS — I10 Essential (primary) hypertension: Secondary | ICD-10-CM

## 2024-02-06 DIAGNOSIS — E782 Mixed hyperlipidemia: Secondary | ICD-10-CM

## 2024-02-08 NOTE — Telephone Encounter (Signed)
 Lipid panel in date.  LOV 10/19/2023  Requested Prescriptions  Pending Prescriptions Disp Refills   hydrochlorothiazide  (HYDRODIURIL ) 25 MG tablet [Pharmacy Med Name: hydroCHLOROthiazide  25 MG Oral Tablet] 90 tablet 0    Sig: Take 1 tablet by mouth once daily     Cardiovascular: Diuretics - Thiazide Failed - 02/08/2024  2:48 PM      Failed - Last BP in normal range    BP Readings from Last 1 Encounters:  10/30/23 (!) 150/97         Passed - Cr in normal range and within 180 days    Creat  Date Value Ref Range Status  10/19/2023 0.75 0.70 - 1.35 mg/dL Final   Creatinine, Urine  Date Value Ref Range Status  04/12/2023 122 20 - 320 mg/dL Final         Passed - K in normal range and within 180 days    Potassium  Date Value Ref Range Status  10/19/2023 4.1 3.5 - 5.3 mmol/L Final         Passed - Na in normal range and within 180 days    Sodium  Date Value Ref Range Status  10/19/2023 140 135 - 146 mmol/L Final         Passed - Valid encounter within last 6 months    Recent Outpatient Visits           3 months ago Type 2 diabetes mellitus without complication, without long-term current use of insulin (HCC)   [place] [facility] Rockney Cid, DO               atorvastatin  (LIPITOR) 80 MG tablet [Pharmacy Med Name: Atorvastatin  Calcium  80 MG Oral Tablet] 90 tablet 0    Sig: TAKE 1 TABLET BY MOUTH AT BEDTIME     Cardiovascular:  Antilipid - Statins Failed - 02/08/2024  2:48 PM      Failed - Lipid Panel in normal range within the last 12 months    Cholesterol  Date Value Ref Range Status  10/19/2023 134 <200 mg/dL Final   LDL Cholesterol (Calc)  Date Value Ref Range Status  10/19/2023 74 mg/dL (calc) Final    Comment:    Reference range: <100 . Desirable range <100 mg/dL for primary prevention;   <70 mg/dL for patients with CHD or diabetic patients  with > or = 2 CHD risk factors. Aaron Aas LDL-C is now calculated using the Martin-Hopkins   calculation, which is a validated novel method providing  better accuracy than the Friedewald equation in the  estimation of LDL-C.  Melinda Sprawls et al. Erroll Heard. 4098;119(14): 2061-2068  (http://education.[facility].com/faq/FAQ164)    HDL  Date Value Ref Range Status  10/19/2023 40 > OR = 40 mg/dL Final   Triglycerides  Date Value Ref Range Status  10/19/2023 110 <150 mg/dL Final         Passed - Patient is not pregnant      Passed - Valid encounter within last 12 months    Recent Outpatient Visits           3 months ago Type 2 diabetes mellitus without complication, without long-term current use of insulin [facility])   [facility] Rockney Cid, [place]

## 2024-03-04 ENCOUNTER — Other Ambulatory Visit: Admitting: Urology

## 2024-03-06 ENCOUNTER — Other Ambulatory Visit: Admitting: Urology

## 2024-03-20 ENCOUNTER — Encounter: Payer: Self-pay | Admitting: Urology

## 2024-03-20 ENCOUNTER — Ambulatory Visit: Admitting: Urology

## 2024-03-20 VITALS — BP 133/83 | HR 70

## 2024-03-20 DIAGNOSIS — C672 Malignant neoplasm of lateral wall of bladder: Secondary | ICD-10-CM | POA: Diagnosis not present

## 2024-03-20 NOTE — Progress Notes (Signed)
   03/20/24  CC:  Chief Complaint  Patient presents with   Cysto    HPI: Refer to Dr. Bjorn previous note 10/30/2023 for a clinical summary.  Cystoscopy at that visit was negative  Blood pressure 133/83, pulse 70. NED. A&Ox3.   No respiratory distress   Abd soft, NT, ND Normal phallus with bilateral descended testicles  Cystoscopy Procedure Note  Patient identification was confirmed, informed consent was obtained, and patient was prepped using Betadine solution.  Lidocaine  jelly was administered per urethral meatus.     Pre-Procedure: - Inspection reveals a normal caliber urethral meatus.  Procedure: The flexible cystoscope was introduced without difficulty - No urethral strictures/lesions are present. - Moderate lateral lobe enlargement prostate  - Normal bladder neck - Bilateral ureteral orifices identified - Bladder mucosa  reveals no ulcers, tumors, or lesions - No bladder stones - Mild trabeculation  Retroflexion shows no tumor or intravesical median lobe   Post-Procedure: - Patient tolerated the procedure well  Assessment/ Plan: No evidence recurrent tumor Follow-up surveillance cystoscopy 4 months Increase surveillance interval to every 6 months if next cystoscopy negative    Glendia JAYSON Barba, MD

## 2024-03-21 LAB — MICROSCOPIC EXAMINATION

## 2024-03-21 LAB — URINALYSIS, COMPLETE
Bilirubin, UA: NEGATIVE
Glucose, UA: NEGATIVE
Ketones, UA: NEGATIVE
Leukocytes,UA: NEGATIVE
Nitrite, UA: NEGATIVE
Protein,UA: NEGATIVE
RBC, UA: NEGATIVE
Specific Gravity, UA: 1.025 (ref 1.005–1.030)
Urobilinogen, Ur: 2 mg/dL — ABNORMAL HIGH (ref 0.2–1.0)
pH, UA: 6 (ref 5.0–7.5)

## 2024-04-01 ENCOUNTER — Other Ambulatory Visit: Payer: Self-pay | Admitting: Internal Medicine

## 2024-04-01 DIAGNOSIS — E119 Type 2 diabetes mellitus without complications: Secondary | ICD-10-CM

## 2024-04-03 NOTE — Telephone Encounter (Signed)
 Patient will need an office visit prior to next refill. Requested Prescriptions  Pending Prescriptions Disp Refills   OZEMPIC, 1 MG/DOSE, 4 MG/3ML SOPN [Pharmacy Med Name: Ozempic (1 MG/DOSE) 4 MG/3ML Subcutaneous Solution Pen-injector] 9 mL 0    Sig: INJECT 1 MG SUBCUTANEOUSLY ONCE A WEEK     Endocrinology:  Diabetes - GLP-1 Receptor Agonists - semaglutide Failed - 04/03/2024  3:34 PM      Failed - HBA1C in normal range and within 180 days    Hgb A1C (fingerstick)  Date Value Ref Range Status  10/05/2016 6.4 (H) <5.7 % Final    Comment:                                                                           According to the ADA Clinical Practice Recommendations for 2011, when HbA1c is used as a screening test:     >=6.5%   Diagnostic of Diabetes Mellitus            (if abnormal result is confirmed)   5.7-6.4%   Increased risk of developing Diabetes Mellitus   References:Diagnosis and Classification of Diabetes Mellitus,Diabetes Care,2011,34(Suppl 1):S62-S69 and Standards of Medical Care in         Diabetes - 2011,Diabetes Care,2011,34 (Suppl 1):S11-S61.      Hemoglobin A1C  Date Value Ref Range Status  10/19/2023 6.4 (A) 4.0 - 5.6 % Final   Hgb A1c MFr Bld  Date Value Ref Range Status  10/06/2022 6.8 (H) <5.7 % of total Hgb Final    Comment:    For someone without known diabetes, a hemoglobin A1c value of 6.5% or greater indicates that they may have  diabetes and this should be confirmed with a follow-up  test. . For someone with known diabetes, a value <7% indicates  that their diabetes is well controlled and a value  greater than or equal to 7% indicates suboptimal  control. A1c targets should be individualized based on  duration of diabetes, age, comorbid conditions, and  other considerations. . Currently, no consensus exists regarding use of hemoglobin A1c for diagnosis of diabetes for children. .          Passed - Cr in normal range and within 360 days     Creat  Date Value Ref Range Status  10/19/2023 0.75 0.70 - 1.35 mg/dL Final   Creatinine, Urine  Date Value Ref Range Status  04/12/2023 122 20 - 320 mg/dL Final         Passed - Valid encounter within last 6 months    Recent Outpatient Visits           5 months ago Type 2 diabetes mellitus without complication, without long-term current use of insulin [facility])   [facility] Bernardo Fend, [place]

## 2024-04-17 ENCOUNTER — Ambulatory Visit: Payer: BC Managed Care – PPO | Admitting: Internal Medicine

## 2024-04-17 DIAGNOSIS — M13842 Other specified arthritis, left hand: Secondary | ICD-10-CM | POA: Diagnosis not present

## 2024-04-17 DIAGNOSIS — M65342 Trigger finger, left ring finger: Secondary | ICD-10-CM | POA: Diagnosis not present

## 2024-05-11 ENCOUNTER — Other Ambulatory Visit: Payer: Self-pay | Admitting: Internal Medicine

## 2024-05-11 DIAGNOSIS — I10 Essential (primary) hypertension: Secondary | ICD-10-CM

## 2024-05-12 NOTE — Telephone Encounter (Signed)
 Requested medications are due for refill today.  yes  Requested medications are on the active medications list.  yes  Last refill. 02/08/2024 #90 0 rf  Future visit scheduled.   no  Notes to clinic.  Pt last seen 03/2024 - pt was to [facility] 03/2024. Labs are expired.    Requested Prescriptions  Pending Prescriptions Disp Refills   hydrochlorothiazide  (HYDRODIURIL ) 25 MG tablet [Pharmacy Med Name: hydroCHLOROthiazide  25 MG Oral Tablet] 90 tablet 0    Sig: Take 1 tablet by mouth once daily     Cardiovascular: Diuretics - Thiazide Failed - 05/12/2024  5:36 PM      Failed - Cr in normal range and within 180 days    Creat  Date Value Ref Range Status  10/19/2023 0.75 0.70 - 1.35 mg/dL Final   Creatinine, Urine  Date Value Ref Range Status  04/12/2023 122 20 - 320 mg/dL Final         Failed - K in normal range and within 180 days    Potassium  Date Value Ref Range Status  10/19/2023 4.1 3.5 - 5.3 mmol/L Final         Failed - Na in normal range and within 180 days    Sodium  Date Value Ref Range Status  10/19/2023 140 135 - 146 mmol/L Final         Failed - Valid encounter within last 6 months    Recent Outpatient Visits           6 months ago Type 2 diabetes mellitus without complication, without long-term current use of insulin [facility])   [place] [facility] Bernardo Fend, DO              Passed - Last BP in normal range    BP Readings from Last 1 Encounters:  03/20/24 133/83

## 2024-05-13 ENCOUNTER — Other Ambulatory Visit: Payer: Self-pay | Admitting: Internal Medicine

## 2024-05-13 DIAGNOSIS — I10 Essential (primary) hypertension: Secondary | ICD-10-CM

## 2024-05-13 NOTE — Telephone Encounter (Unsigned)
 Copied from CRM #[id number]. Topic: Clinical - Medication Refill >> May 13, 2024  8:20 AM Willma R wrote: Medication: hydrochlorothiazide  (HYDRODIURIL ) 25 MG tablet  Has the patient contacted their pharmacy? Yes, call dr  This is the patient's preferred pharmacy:  [id number] - [place] - [address] [place] [address] Phone: [phone] Fax: [phone]  Is this the correct pharmacy for this prescription? Yes If no, delete pharmacy and type the correct one.   Has the prescription been filled recently? No  Is the patient out of the medication? No, 2 left  Has the patient been seen for an appointment in the last year OR does the patient have an upcoming appointment? Yes  Can we respond through MyChart? Yes  Agent: Please be advised that Rx refills may take up to 3 business days. We ask that you follow-up with your pharmacy.  **scheduled to come in 10/01 for a follow up, needs medication refilled prior as only has 1 left

## 2024-05-14 DIAGNOSIS — M65342 Trigger finger, left ring finger: Secondary | ICD-10-CM | POA: Diagnosis not present

## 2024-05-14 DIAGNOSIS — M13842 Other specified arthritis, left hand: Secondary | ICD-10-CM | POA: Diagnosis not present

## 2024-05-14 NOTE — Telephone Encounter (Signed)
 Too soon for refill, 05/13/24 for 90 days.  Requested Prescriptions  Pending Prescriptions Disp Refills   hydrochlorothiazide  (HYDRODIURIL ) 25 MG tablet 90 tablet 0    Sig: Take 1 tablet (25 mg total) by mouth daily.     Cardiovascular: Diuretics - Thiazide Failed - 05/14/2024  8:56 AM      Failed - Cr in normal range and within 180 days    Creat  Date Value Ref Range Status  10/19/2023 0.75 0.70 - 1.35 mg/dL Final   Creatinine, Urine  Date Value Ref Range Status  04/12/2023 122 20 - 320 mg/dL Final         Failed - K in normal range and within 180 days    Potassium  Date Value Ref Range Status  10/19/2023 4.1 3.5 - 5.3 mmol/L Final         Failed - Na in normal range and within 180 days    Sodium  Date Value Ref Range Status  10/19/2023 140 135 - 146 mmol/L Final         Failed - Valid encounter within last 6 months    Recent Outpatient Visits           6 months ago Type 2 diabetes mellitus without complication, without long-term current use of insulin [facility])   [place] [facility] Bernardo Fend, DO              Passed - Last BP in normal range    BP Readings from Last 1 Encounters:  03/20/24 133/83

## 2024-05-17 ENCOUNTER — Other Ambulatory Visit: Payer: Self-pay | Admitting: Internal Medicine

## 2024-05-17 DIAGNOSIS — I10 Essential (primary) hypertension: Secondary | ICD-10-CM

## 2024-05-20 NOTE — Telephone Encounter (Signed)
 Requested Prescriptions  Pending Prescriptions Disp Refills   benazepril  (LOTENSIN ) 20 MG tablet [Pharmacy Med Name: Benazepril  HCl 20 MG Oral Tablet] 90 tablet 0    Sig: Take 1 tablet by mouth once daily     Cardiovascular:  ACE Inhibitors Failed - 05/20/2024 11:45 AM      Failed - Cr in normal range and within 180 days    Creat  Date Value Ref Range Status  10/19/2023 0.75 0.70 - 1.35 mg/dL Final   Creatinine, Urine  Date Value Ref Range Status  04/12/2023 122 20 - 320 mg/dL Final         Failed - K in normal range and within 180 days    Potassium  Date Value Ref Range Status  10/19/2023 4.1 3.5 - 5.3 mmol/L Final         Failed - Valid encounter within last 6 months    Recent Outpatient Visits           7 months ago Type 2 diabetes mellitus without complication, without long-term current use of insulin [facility])   [facility] Bernardo Fend, [place] - Patient is not pregnant      Passed - Last BP in normal range    BP Readings from Last 1 Encounters:  03/20/24 133/83

## 2024-05-21 ENCOUNTER — Other Ambulatory Visit: Payer: Self-pay

## 2024-05-21 ENCOUNTER — Ambulatory Visit: Admitting: Internal Medicine

## 2024-05-21 ENCOUNTER — Encounter: Payer: Self-pay | Admitting: Internal Medicine

## 2024-05-21 VITALS — BP 128/76 | HR 100 | Temp 97.8°F | Resp 16 | Ht 70.0 in | Wt 209.6 lb

## 2024-05-21 DIAGNOSIS — Z23 Encounter for immunization: Secondary | ICD-10-CM

## 2024-05-21 DIAGNOSIS — E119 Type 2 diabetes mellitus without complications: Secondary | ICD-10-CM

## 2024-05-21 DIAGNOSIS — R972 Elevated prostate specific antigen [PSA]: Secondary | ICD-10-CM

## 2024-05-21 DIAGNOSIS — Z125 Encounter for screening for malignant neoplasm of prostate: Secondary | ICD-10-CM | POA: Diagnosis not present

## 2024-05-21 DIAGNOSIS — Z7984 Long term (current) use of oral hypoglycemic drugs: Secondary | ICD-10-CM | POA: Diagnosis not present

## 2024-05-21 DIAGNOSIS — E782 Mixed hyperlipidemia: Secondary | ICD-10-CM

## 2024-05-21 DIAGNOSIS — I10 Essential (primary) hypertension: Secondary | ICD-10-CM | POA: Diagnosis not present

## 2024-05-21 LAB — POCT GLYCOSYLATED HEMOGLOBIN (HGB A1C): Hemoglobin A1C: 6.3 % — AB (ref 4.0–5.6)

## 2024-05-21 MED ORDER — BENAZEPRIL HCL 40 MG PO TABS: 40.0000 mg | ORAL_TABLET | Freq: Every day | ORAL | 1 refills | Status: AC

## 2024-05-21 MED ORDER — HYDROCHLOROTHIAZIDE 25 MG PO TABS: 25.0000 mg | ORAL_TABLET | Freq: Every day | ORAL | 1 refills | Status: AC

## 2024-05-21 MED ORDER — ATORVASTATIN CALCIUM 80 MG PO TABS: 80.0000 mg | ORAL_TABLET | Freq: Every day | ORAL | 1 refills | Status: AC

## 2024-05-21 NOTE — Progress Notes (Signed)
 Established Patient Office Visit  Subjective   Patient ID: Ian Leblanc, male    DOB: [birth year]  Age: 63 y.o. MRN: [id number]  Chief Complaint  Patient presents with   Medical Management of Chronic Issues    6 month recheck    HPI  Patient is here for follow up on chronic medical conditions.   Discussed the use of AI scribe software for clinical note transcription with the patient, who gave verbal consent to proceed.  History of Present Illness Ian Leblanc is a 63 year old male who presents for routine follow-up and lab review.  His A1c has improved to 6.3 from 6.4 previously and significantly from 7.8 a year ago. He is taking Ozempic  and metformin  once daily, although prescribed twice daily. Increased activity during the summer has contributed to weight loss from 221 lbs in March to 209 lbs currently. He tends to gain weight in the winter and lose it in the summer.  He is scheduled for trigger finger surgery next Tuesday. Pain occurs when the finger locks, and previous injections provided only temporary relief.  PSA levels have increased from 1.25 a year ago to 4.39 earlier this year. He recently had a bladder check and believes a prostate exam was performed. A follow-up with urology is scheduled in three months.  He occasionally experiences mild sciatica symptoms but nothing severe. He wears compression stockings for his legs, which become uncomfortable around 5:30 PM. He is cautious about leaving medications around due to visiting grandchildren.   Hypertension: -Medications: Benazepril  40 mg, hydrochlorothiazide  25 mg  -Patient is compliant with above medications and reports no side effects. -Checking BP at home (average): 140-145/90-95 -Denies any SOB, CP, vision changes, LE edema or symptoms of hypotension  HLD: -Medications: Lipitor 80 mg -Patient is compliant with above medications and reports no side effects.  -Last lipid panel: Lipid Panel     Component Value  Date/Time   CHOL 134 10/19/2023 0824   TRIG 110 10/19/2023 0824   HDL 40 10/19/2023 0824   CHOLHDL 3.4 10/19/2023 0824   VLDL 20 07/05/2016 0843   LDLCALC 74 10/19/2023 0824   The 10-year ASCVD risk score (Arnett DK, et al., 2019) is: 23.2%   Values used to calculate the score:     Age: 72 years     Clincally relevant sex: Male     Is Non-Hispanic African American: No     Diabetic: Yes     Tobacco smoker: No     Systolic Blood Pressure: 128 mmHg     Is BP treated: Yes     HDL Cholesterol: 40 mg/dL     Total Cholesterol: 134 mg/dL   Diabetes, Type 2: -Last A1c 2/25 6.4% -Medications: Metformin  500 mg once daily, Ozempic  1 mg -Patient is compliant with the above medications and reports no side effects.  -Checking BG at home: No -Eye exam: Due, referral placed previously  -Foot exam: Due today -Microalbumin: Due today -Statin: yes -PNA vaccine: UTD -Denies symptoms of hypoglycemia, polyuria, polydipsia, numbness extremities, foot ulcers/trauma.   Health Maintenance: -Blood work due - recheck PSA -Colonoscopy 2/25 - repeat in 7 years  -Flu vaccine today   Patient Active Problem List   Diagnosis Date Noted   Sciatica of left side 10/19/2023   Malignant neoplasm of bladder (HCC) 12/07/2022   Chronic rhinitis 08/27/2020   Controlled type 2 diabetes mellitus with hyperglycemia, without long-term current use of insulin (HCC) 07/04/2019   Allergic rhinitis 07/03/2019   Cervicalgia  02/04/2015   Diabetes mellitus type 2, uncomplicated (HCC) 06/08/2014   HTN (hypertension) 10/10/2012   HLD (hyperlipidemia) 10/10/2012   History of colonic polyps 10/10/2012   Past Medical History:  Diagnosis Date   Abnormal EKG 07/16/2013   Arthritis    Cancer (HCC)    bladder   Cervical spine disease 02/04/2015   Chest pain 07/16/2013   Diabetes mellitus without complication (HCC)    History of kidney stones    Hyperglycemia    Hyperlipidemia    Hypertension    Neck pain on left  side 02/04/2015   Numbness of fingers 02/04/2015   Paresthesia of left arm 02/04/2015   Screening for prostate cancer 12/09/2014   Sessile colonic polyp    Past Surgical History:  Procedure Laterality Date   BACK SURGERY     cyst removal. no metal in his back   BLADDER INSTILLATION N/A 01/26/2022   Procedure: BLADDER INSTILLATION OF GEMCITABINE ;  Surgeon: Penne Knee, MD;  Location: [facility];  Service: Urology;  Laterality: N/A;   BLADDER INSTILLATION N/A 12/25/2022   Procedure: BLADDER INSTILLATION OF GEMCITABINE ;  Surgeon: Penne Knee, MD;  Location: [facility];  Service: Urology;  Laterality: N/A;   CYSTOSCOPY W/ RETROGRADES Bilateral 01/26/2022   Procedure: CYSTOSCOPY WITH RETROGRADE PYELOGRAM;  Surgeon: Penne Knee, MD;  Location: [facility];  Service: Urology;  Laterality: Bilateral;   CYSTOSCOPY W/ RETROGRADES Bilateral 12/25/2022   Procedure: CYSTOSCOPY WITH RETROGRADE PYELOGRAM;  Surgeon: Penne Knee, MD;  Location: [facility];  Service: Urology;  Laterality: Bilateral;   TRANSURETHRAL RESECTION OF BLADDER TUMOR N/A 01/10/2021   Procedure: TRANSURETHRAL RESECTION OF BLADDER TUMOR (TURBT) WITH GEMCITABINE ;  Surgeon: Penne Knee, MD;  Location: [facility];  Service: Urology;  Laterality: N/A;   TRANSURETHRAL RESECTION OF BLADDER TUMOR N/A 01/26/2022   Procedure: TRANSURETHRAL RESECTION OF BLADDER TUMOR (TURBT);  Surgeon: Penne Knee, MD;  Location: [facility];  Service: Urology;  Laterality: N/A;   TRANSURETHRAL RESECTION OF BLADDER TUMOR N/A 12/25/2022   Procedure: TRANSURETHRAL RESECTION OF BLADDER TUMOR (TURBT);  Surgeon: Penne Knee, MD;  Location: [facility];  Service: Urology;  Laterality: N/A;   Social History   Tobacco Use   Smoking status: Former    Current packs/day: 0.00    Types: Cigarettes    Quit date: 07/17/2003    Years since quitting: 20.8    Passive exposure: Past   Smokeless tobacco: Never  Vaping Use   Vaping status: Never Used  Substance Use  Topics   Alcohol use: Yes    Comment: wine occ   Drug use: No   Social History   Socioeconomic History   Marital status: Married    Spouse name: Zebedee   Number of children: Not on file   Years of education: Not on file   Highest education level: 12th grade  Occupational History   Occupation: Transport planner, Physiological scientist  Tobacco Use   Smoking status: Former    Current packs/day: 0.00    Types: Cigarettes    Quit date: 07/17/2003    Years since quitting: 20.8    Passive exposure: Past   Smokeless tobacco: Never  Vaping Use   Vaping status: Never Used  Substance and Sexual Activity   Alcohol use: Yes    Comment: wine occ   Drug use: No   Sexual activity: Yes    Birth control/protection: None  Other Topics Concern   Not on file  Social History Narrative   Patient lives with wife only. Feels  safe in his home.    Social Drivers of Corporate investment banker Strain: Low Risk  (05/17/2024)   Overall Financial Resource Strain (CARDIA)    Difficulty of Paying Living Expenses: Not hard at all  Food Insecurity: No Food Insecurity (05/17/2024)   Hunger Vital Sign    Worried About Running Out of Food in the Last Year: Never true    Ran Out of Food in the Last Year: Never true  Transportation Needs: No Transportation Needs (05/17/2024)   PRAPARE - Administrator, Civil Service (Medical): No    Lack of Transportation (Non-Medical): No  Physical Activity: Insufficiently Active (05/17/2024)   Exercise Vital Sign    Days of Exercise per Week: 3 days    Minutes of Exercise per Session: 30 min  Stress: No Stress Concern Present (05/17/2024)   [employer] of Occupational Health - Occupational Stress Questionnaire    Feeling of Stress: Not at all  Social Connections: Moderately Isolated (05/17/2024)   Social Connection and Isolation Panel    Frequency of Communication with Friends and Family: Once a week    Frequency of Social Gatherings with Friends and  Family: Twice a week    Attends Religious Services: Never    Database administrator or Organizations: No    Attends Engineer, structural: Not on file    Marital Status: Married  Catering manager Violence: Not At Risk (12/07/2022)   Humiliation, Afraid, Rape, and Kick questionnaire    Fear of Current or Ex-Partner: No    Emotionally Abused: No    Physically Abused: No    Sexually Abused: No   Family Status  Relation Name Status   Mother  Alive   Father  Deceased       pneumonia   Sister  Alive   Brother  Alive   PGF  Deceased  No partnership data on file   Family History  Problem Relation Age of Onset   Prostate cancer Paternal Grandfather    Allergies  Allergen Reactions   Crestor [Rosuvastatin] Other (See Comments)    Severe myalgias      Review of Systems  Gastrointestinal:  Negative for abdominal pain, constipation, heartburn, nausea and vomiting.      Objective:     BP 128/76 (Cuff Size: Large)   Pulse 100   Temp 97.8 F (36.6 C) (Oral)   Resp 16   Ht 5' 10 (1.778 m)   Wt 209 lb 9.6 oz (95.1 kg)   SpO2 99%   BMI 30.07 kg/m  BP Readings from Last 3 Encounters:  05/21/24 128/76  03/20/24 133/83  10/30/23 (!) 150/97   Wt Readings from Last 3 Encounters:  05/21/24 209 lb 9.6 oz (95.1 kg)  10/30/23 221 lb (100.2 kg)  10/19/23 216 lb (98 kg)      Physical Exam Constitutional:      Appearance: Normal appearance.  HENT:     Head: Normocephalic and atraumatic.     Mouth/Throat:     Mouth: Mucous membranes are moist.     Pharynx: Oropharynx is clear.  Eyes:     Extraocular Movements: Extraocular movements intact.     Conjunctiva/sclera: Conjunctivae normal.     Pupils: Pupils are equal, round, and reactive to light.  Neck:     Comments: No thyromegaly Cardiovascular:     Rate and Rhythm: Normal rate and regular rhythm.     Pulses:  Dorsalis pedis pulses are 2+ on the right side and 2+ on the left side.  Pulmonary:      Effort: Pulmonary effort is normal.     Breath sounds: Normal breath sounds.  Musculoskeletal:     Cervical back: No tenderness.     Right lower leg: No edema.     Left lower leg: No edema.     Right foot: Normal range of motion. No deformity, bunion, Charcot foot, foot drop or prominent metatarsal heads.     Left foot: Normal range of motion. No deformity, bunion, Charcot foot, foot drop or prominent metatarsal heads.  Feet:     Right foot:     Protective Sensation: 6 sites tested.  6 sites sensed.     Skin integrity: Skin integrity normal.     Toenail Condition: Right toenails are normal.     Left foot:     Protective Sensation: 6 sites tested.  6 sites sensed.     Skin integrity: Skin integrity normal.     Toenail Condition: Left toenails are normal.  Lymphadenopathy:     Cervical: No cervical adenopathy.  Skin:    General: Skin is warm and dry.  Neurological:     General: No focal deficit present.     Mental Status: He is alert. Mental status is at baseline.  Psychiatric:        Mood and Affect: Mood normal.        Behavior: Behavior normal.      No results found for any visits on 05/21/24.  Last CBC Lab Results  Component Value Date   WBC 6.8 10/19/2023   HGB 14.8 10/19/2023   HCT 44.6 10/19/2023   MCV 90.5 10/19/2023   MCH 30.0 10/19/2023   RDW 12.7 10/19/2023   PLT 177 10/19/2023   Last metabolic panel Lab Results  Component Value Date   GLUCOSE 114 (H) 10/19/2023   NA 140 10/19/2023   K 4.1 10/19/2023   CL 104 10/19/2023   CO2 28 10/19/2023   BUN 10 10/19/2023   CREATININE 0.75 10/19/2023   EGFR 101 10/19/2023   CALCIUM  9.5 10/19/2023   PROT 6.8 10/19/2023   ALBUMIN 4.6 07/05/2016   BILITOT 0.6 10/19/2023   ALKPHOS 68 07/05/2016   AST 22 10/19/2023   ALT 49 (H) 10/19/2023   ANIONGAP 6 01/25/2022   Last lipids Lab Results  Component Value Date   CHOL 134 10/19/2023   HDL 40 10/19/2023   LDLCALC 74 10/19/2023   TRIG 110 10/19/2023   CHOLHDL  3.4 10/19/2023   Last hemoglobin A1c Lab Results  Component Value Date   HGBA1C 6.4 (A) 10/19/2023   Last thyroid functions No results found for: TSH, T3TOTAL, T4TOTAL, THYROIDAB Last vitamin D No results found for: 25OHVITD2, 25OHVITD3, VD25OH Last vitamin B12 and Folate No results found for: VITAMINB12, FOLATE    The 10-year ASCVD risk score (Arnett DK, et al., 2019) is: 23.2%    Assessment & Plan:   Assessment & Plan Type 2 diabetes mellitus Well-controlled with A1c of 6.3. Metformin  unnecessary due to good glycemic control. - Discontinue metformin . - Continue Ozempic  1 mg. - Perform annual diabetic urine test. - Conduct foot exam.  Elevated PSA PSA increased from 1.25 to 4.39, above normal threshold. Re-evaluation needed to rule out prostate cancer progression. - Order repeat PSA test. - Discuss results with urologist if PSA remains elevated.  Hypertension Well-controlled with BP 120/76 mmHg on hydrochlorothiazide  and benazepril . - Refill hydrochlorothiazide . -  Refill benazepril .  Hyperlipidemia Managed with Lipitor. Cholesterol levels are good. - Refill Lipitor.  General Health Maintenance Up to date with vaccinations. Declines further COVID-19 boosters. - Administer flu shot.  - POCT HgB A1C - Urine Microalbumin w/creat. ratio - HM Diabetes Foot Exam - hydrochlorothiazide  (HYDRODIURIL ) 25 MG tablet; Take 1 tablet (25 mg total) by mouth daily.  Dispense: 90 tablet; Refill: 1 - benazepril  (LOTENSIN ) 40 MG tablet; Take 1 tablet (40 mg total) by mouth daily.  Dispense: 90 tablet; Refill: 1 - atorvastatin  (LIPITOR) 80 MG tablet; Take 1 tablet (80 mg total) by mouth at bedtime.  Dispense: 90 tablet; Refill: 1 - PSA - Flu vaccine HIGH DOSE PF(Fluzone Trivalent)   Return in about 6 months (around 11/19/2024).    Sharyle Fischer, DO

## 2024-05-22 ENCOUNTER — Ambulatory Visit: Payer: Self-pay | Admitting: Internal Medicine

## 2024-05-22 LAB — MICROALBUMIN / CREATININE URINE RATIO
Creatinine, Urine: 160 mg/dL (ref 20–320)
Microalb Creat Ratio: 5 mg/g{creat} (ref <30)
Microalb, Ur: 0.8 mg/dL

## 2024-05-22 LAB — PSA: PSA: 1.75 ng/mL (ref <=4.00)

## 2024-05-27 DIAGNOSIS — M65342 Trigger finger, left ring finger: Secondary | ICD-10-CM | POA: Diagnosis not present

## 2024-06-09 ENCOUNTER — Other Ambulatory Visit: Payer: Self-pay | Admitting: Emergency Medicine

## 2024-06-09 DIAGNOSIS — E119 Type 2 diabetes mellitus without complications: Secondary | ICD-10-CM

## 2024-06-10 ENCOUNTER — Other Ambulatory Visit: Payer: Self-pay | Admitting: Internal Medicine

## 2024-06-10 DIAGNOSIS — E119 Type 2 diabetes mellitus without complications: Secondary | ICD-10-CM

## 2024-06-12 NOTE — Telephone Encounter (Signed)
 Discontinued on 05/21/24.  Requested Prescriptions  Pending Prescriptions Disp Refills   metFORMIN  (GLUCOPHAGE ) 500 MG tablet [Pharmacy Med Name: metFORMIN  HCl 500 MG Oral Tablet] 180 tablet 0    Sig: TAKE 1 TABLET BY MOUTH TWICE DAILY WITH A MEAL     Endocrinology:  Diabetes - Biguanides Failed - 06/12/2024  9:03 AM      Failed - B12 Level in normal range and within 720 days    No results found for: VITAMINB12       Passed - Cr in normal range and within 360 days    Creat  Date Value Ref Range Status  10/19/2023 0.75 0.70 - 1.35 mg/dL Final   Creatinine, Urine  Date Value Ref Range Status  05/21/2024 160 20 - 320 mg/dL Final         Passed - HBA1C is between 0 and 7.9 and within 180 days    Hgb A1C (fingerstick)  Date Value Ref Range Status  10/05/2016 6.4 (H) <5.7 % Final    Comment:                                                                           According to the ADA Clinical Practice Recommendations for 2011, when HbA1c is used as a screening test:     >=6.5%   Diagnostic of Diabetes Mellitus            (if abnormal result is confirmed)   5.7-6.4%   Increased risk of developing Diabetes Mellitus   References:Diagnosis and Classification of Diabetes Mellitus,Diabetes Care,2011,34(Suppl 1):S62-S69 and Standards of Medical Care in         Diabetes - 2011,Diabetes Care,2011,34 (Suppl 1):S11-S61.      Hemoglobin A1C  Date Value Ref Range Status  05/21/2024 6.3 (A) 4.0 - 5.6 % Final   Hgb A1c MFr Bld  Date Value Ref Range Status  10/06/2022 6.8 (H) <5.7 % of total Hgb Final    Comment:    For someone without known diabetes, a hemoglobin A1c value of 6.5% or greater indicates that they may have  diabetes and this should be confirmed with a follow-up  test. . For someone with known diabetes, a value <7% indicates  that their diabetes is well controlled and a value  greater than or equal to 7% indicates suboptimal  control. A1c targets should be  individualized based on  duration of diabetes, age, comorbid conditions, and  other considerations. . Currently, no consensus exists regarding use of hemoglobin A1c for diagnosis of diabetes for children. .          Passed - eGFR in normal range and within 360 days    GFR, Est African American  Date Value Ref Range Status  05/11/2020 115 > OR = 60 mL/min/1.39m2 Final   GFR, Est Non African American  Date Value Ref Range Status  05/11/2020 100 > OR = 60 mL/min/1.20m2 Final   GFR, Estimated  Date Value Ref Range Status  01/25/2022 >60 >60 mL/min Final    Comment:    (NOTE) Calculated using the CKD-EPI Creatinine Equation (2021)    eGFR  Date Value Ref Range Status  10/19/2023 101 > OR = 60 mL/min/1.30m2 Final  Passed - Valid encounter within last 6 months    Recent Outpatient Visits           3 weeks ago Type 2 diabetes mellitus without complication, without long-term current use of insulin [facility])   [place] [facility] Bernardo Fend, [place]   7 months ago Type 2 diabetes mellitus without complication, without long-term current use of insulin [facility])   [place] [facility] Bernardo Fend, [place]              Passed - CBC within normal limits and completed in the last 12 months    WBC  Date Value Ref Range Status  10/19/2023 6.8 3.8 - 10.8 Thousand/uL Final   RBC  Date Value Ref Range Status  10/19/2023 4.93 4.20 - 5.80 Million/uL Final   Hemoglobin  Date Value Ref Range Status  10/19/2023 14.8 13.2 - 17.1 g/dL Final   HCT  Date Value Ref Range Status  10/19/2023 44.6 38.5 - 50.0 % Final   MCHC  Date Value Ref Range Status  10/19/2023 33.2 32.0 - 36.0 g/dL Final    Comment:    For adults, a slight decrease in the calculated MCHC value (in the range of 30 to 32 g/dL) is most likely not clinically significant; however, it should be interpreted with caution in correlation with other red cell parameters and the  patient's clinical condition.    [facility]  Date Value Ref Range Status  10/19/2023 30.0 27.0 - 33.0 pg Final   MCV  Date Value Ref Range Status  10/19/2023 90.5 80.0 - 100.0 fL Final   No results found for: PLTCOUNTKUC, LABPLAT, POCPLA RDW  Date Value Ref Range Status  10/19/2023 12.7 11.0 - 15.0 % Final

## 2024-06-19 ENCOUNTER — Other Ambulatory Visit: Payer: Self-pay | Admitting: Internal Medicine

## 2024-06-19 DIAGNOSIS — E119 Type 2 diabetes mellitus without complications: Secondary | ICD-10-CM

## 2024-06-21 NOTE — Telephone Encounter (Signed)
 Requested Prescriptions  Pending Prescriptions Disp Refills   Semaglutide , 1 MG/DOSE, (OZEMPIC , 1 MG/DOSE,) 4 MG/3ML SOPN [Pharmacy Med Name: Ozempic  (1 MG/DOSE) 4 MG/3ML Subcutaneous Solution Pen-injector] 9 mL 2    Sig: INJECT 1 MG SUBCUTANEOUSLY ONCE A WEEK     Endocrinology:  Diabetes - GLP-1 Receptor Agonists - semaglutide  Failed - 06/21/2024  8:14 AM      Failed - HBA1C in normal range and within 180 days    Hgb A1C (fingerstick)  Date Value Ref Range Status  10/05/2016 6.4 (H) <5.7 % Final    Comment:                                                                           According to the ADA Clinical Practice Recommendations for 2011, when HbA1c is used as a screening test:     >=6.5%   Diagnostic of Diabetes Mellitus            (if abnormal result is confirmed)   5.7-6.4%   Increased risk of developing Diabetes Mellitus   References:Diagnosis and Classification of Diabetes Mellitus,Diabetes Care,2011,34(Suppl 1):S62-S69 and Standards of Medical Care in         Diabetes - 2011,Diabetes Care,2011,34 (Suppl 1):S11-S61.      Hemoglobin A1C  Date Value Ref Range Status  05/21/2024 6.3 (A) 4.0 - 5.6 % Final   Hgb A1c MFr Bld  Date Value Ref Range Status  10/06/2022 6.8 (H) <5.7 % of total Hgb Final    Comment:    For someone without known diabetes, a hemoglobin A1c value of 6.5% or greater indicates that they may have  diabetes and this should be confirmed with a follow-up  test. . For someone with known diabetes, a value <7% indicates  that their diabetes is well controlled and a value  greater than or equal to 7% indicates suboptimal  control. A1c targets should be individualized based on  duration of diabetes, age, comorbid conditions, and  other considerations. . Currently, no consensus exists regarding use of hemoglobin A1c for diagnosis of diabetes for children. .          Passed - Cr in normal range and within 360 days    Creat  Date Value Ref Range  Status  10/19/2023 0.75 0.70 - 1.35 mg/dL Final   Creatinine, Urine  Date Value Ref Range Status  05/21/2024 160 20 - 320 mg/dL Final         Passed - Valid encounter within last 6 months    Recent Outpatient Visits           1 month ago Type 2 diabetes mellitus without complication, without long-term current use of insulin [facility])   [place] [facility] Bernardo Fend, [place]   8 months ago Type 2 diabetes mellitus without complication, without long-term current use of insulin [facility])   [facility] Bernardo Fend, [place]

## 2024-07-04 LAB — OPHTHALMOLOGY REPORT-SCANNED

## 2024-07-22 ENCOUNTER — Other Ambulatory Visit: Admitting: Urology

## 2024-07-29 ENCOUNTER — Encounter: Payer: Self-pay | Admitting: Urology

## 2024-07-29 ENCOUNTER — Ambulatory Visit: Admitting: Urology

## 2024-07-29 VITALS — BP 131/90 | HR 111 | Ht 69.0 in | Wt 210.0 lb

## 2024-07-29 DIAGNOSIS — Z8551 Personal history of malignant neoplasm of bladder: Secondary | ICD-10-CM

## 2024-07-29 DIAGNOSIS — C672 Malignant neoplasm of lateral wall of bladder: Secondary | ICD-10-CM | POA: Diagnosis not present

## 2024-07-29 NOTE — Progress Notes (Unsigned)
   07/29/24  CC:  Chief Complaint  Patient presents with   Cysto   Urologic history: ***   HPI: Refer to Dr. Bjorn previous note 10/30/2023 for a clinical summary.  No complaints since last visit.  No dysuria or gross hematuria.  UA dipstick/microscopy negative  Blood pressure 133/83, pulse 70. NED. A&Ox3.   No respiratory distress   Abd soft, NT, ND Normal phallus with bilateral descended testicles  Cystoscopy Procedure Note  Patient identification was confirmed, informed consent was obtained, and patient was prepped using Betadine solution.  Lidocaine  jelly was administered per urethral meatus.     Pre-Procedure: - Inspection reveals a normal caliber urethral meatus.  Procedure: The flexible cystoscope was introduced without difficulty - No urethral strictures/lesions are present. - Moderate lateral lobe enlargement prostate  - Normal bladder neck - Bilateral ureteral orifices identified - Bladder mucosa  reveals no ulcers, tumors, or lesions - No bladder stones - Mild trabeculation  Retroflexion shows no tumor or intravesical median lobe   Post-Procedure: - Patient tolerated the procedure well  Assessment/ Plan: No evidence recurrent tumor Follow-up surveillance cystoscopy 6 months    Glendia JAYSON Barba, MD

## 2024-07-30 ENCOUNTER — Encounter: Payer: Self-pay | Admitting: Urology

## 2024-07-30 LAB — MICROSCOPIC EXAMINATION: Bacteria, UA: NONE SEEN

## 2024-07-30 LAB — URINALYSIS, COMPLETE
Bilirubin, UA: NEGATIVE
Glucose, UA: NEGATIVE
Ketones, UA: NEGATIVE
Leukocytes,UA: NEGATIVE
Nitrite, UA: NEGATIVE
Protein,UA: NEGATIVE
RBC, UA: NEGATIVE
Specific Gravity, UA: 1.025 (ref 1.005–1.030)
Urobilinogen, Ur: 0.2 mg/dL (ref 0.2–1.0)
pH, UA: 6 (ref 5.0–7.5)

## 2024-12-15 ENCOUNTER — Ambulatory Visit: Admitting: Internal Medicine

## 2025-01-27 ENCOUNTER — Other Ambulatory Visit: Admitting: Urology
